# Patient Record
Sex: Female | Born: 1975 | Race: White | Hispanic: No | Marital: Single | Smoking: Former smoker
Health system: Southern US, Community
[De-identification: ages and names within clinical notes are randomized; demographics above are authoritative.]

## PROBLEM LIST (undated history)

## (undated) DIAGNOSIS — J449 Chronic obstructive pulmonary disease, unspecified: Secondary | ICD-10-CM

## (undated) DIAGNOSIS — G8929 Other chronic pain: Secondary | ICD-10-CM

## (undated) DIAGNOSIS — R109 Unspecified abdominal pain: Secondary | ICD-10-CM

## (undated) DIAGNOSIS — F419 Anxiety disorder, unspecified: Secondary | ICD-10-CM

## (undated) DIAGNOSIS — D332 Benign neoplasm of brain, unspecified: Secondary | ICD-10-CM

## (undated) DIAGNOSIS — F32A Depression, unspecified: Secondary | ICD-10-CM

## (undated) DIAGNOSIS — F329 Major depressive disorder, single episode, unspecified: Secondary | ICD-10-CM

## (undated) DIAGNOSIS — O24419 Gestational diabetes mellitus in pregnancy, unspecified control: Secondary | ICD-10-CM

## (undated) DIAGNOSIS — M549 Dorsalgia, unspecified: Secondary | ICD-10-CM

## (undated) HISTORY — DX: Anxiety disorder, unspecified: F41.9

## (undated) HISTORY — PX: BRAIN SURGERY: SHX531

## (undated) HISTORY — DX: Chronic obstructive pulmonary disease, unspecified: J44.9

## (undated) HISTORY — DX: Depression, unspecified: F32.A

## (undated) HISTORY — PX: CHOLECYSTECTOMY: SHX55

## (undated) HISTORY — PX: FINGER SURGERY: SHX640

## (undated) HISTORY — PX: APPENDECTOMY: SHX54

## (undated) HISTORY — PX: TONSILLECTOMY: SUR1361

## (undated) HISTORY — DX: Major depressive disorder, single episode, unspecified: F32.9

## (undated) HISTORY — PX: ABDOMINAL HYSTERECTOMY: SHX81

## (undated) HISTORY — DX: Gestational diabetes mellitus in pregnancy, unspecified control: O24.419

---

## 1998-01-31 HISTORY — PX: TUBAL LIGATION: SHX77

## 2001-06-05 ENCOUNTER — Emergency Department (HOSPITAL_COMMUNITY): Admission: EM | Admit: 2001-06-05 | Discharge: 2001-06-06 | Payer: Self-pay | Admitting: Internal Medicine

## 2001-06-06 ENCOUNTER — Encounter: Payer: Self-pay | Admitting: Internal Medicine

## 2001-06-10 ENCOUNTER — Encounter: Payer: Self-pay | Admitting: Emergency Medicine

## 2001-06-10 ENCOUNTER — Emergency Department (HOSPITAL_COMMUNITY): Admission: EM | Admit: 2001-06-10 | Discharge: 2001-06-10 | Payer: Self-pay | Admitting: Emergency Medicine

## 2002-03-02 ENCOUNTER — Emergency Department (HOSPITAL_COMMUNITY): Admission: EM | Admit: 2002-03-02 | Discharge: 2002-03-02 | Payer: Self-pay | Admitting: Emergency Medicine

## 2002-04-01 ENCOUNTER — Emergency Department (HOSPITAL_COMMUNITY): Admission: EM | Admit: 2002-04-01 | Discharge: 2002-04-01 | Payer: Self-pay | Admitting: Emergency Medicine

## 2002-04-01 ENCOUNTER — Encounter: Payer: Self-pay | Admitting: Emergency Medicine

## 2002-07-19 ENCOUNTER — Encounter: Payer: Self-pay | Admitting: Emergency Medicine

## 2002-07-19 ENCOUNTER — Emergency Department (HOSPITAL_COMMUNITY): Admission: EM | Admit: 2002-07-19 | Discharge: 2002-07-19 | Payer: Self-pay | Admitting: Emergency Medicine

## 2002-12-18 ENCOUNTER — Emergency Department (HOSPITAL_COMMUNITY): Admission: EM | Admit: 2002-12-18 | Discharge: 2002-12-18 | Payer: Self-pay | Admitting: Internal Medicine

## 2003-01-07 ENCOUNTER — Emergency Department (HOSPITAL_COMMUNITY): Admission: EM | Admit: 2003-01-07 | Discharge: 2003-01-07 | Payer: Self-pay | Admitting: *Deleted

## 2003-03-07 ENCOUNTER — Emergency Department (HOSPITAL_COMMUNITY): Admission: EM | Admit: 2003-03-07 | Discharge: 2003-03-07 | Payer: Self-pay | Admitting: Emergency Medicine

## 2003-03-10 ENCOUNTER — Emergency Department (HOSPITAL_COMMUNITY): Admission: EM | Admit: 2003-03-10 | Discharge: 2003-03-10 | Payer: Self-pay | Admitting: *Deleted

## 2003-03-17 ENCOUNTER — Emergency Department (HOSPITAL_COMMUNITY): Admission: EM | Admit: 2003-03-17 | Discharge: 2003-03-17 | Payer: Self-pay | Admitting: Emergency Medicine

## 2003-05-23 ENCOUNTER — Emergency Department (HOSPITAL_COMMUNITY): Admission: EM | Admit: 2003-05-23 | Discharge: 2003-05-23 | Payer: Self-pay

## 2003-08-25 ENCOUNTER — Emergency Department (HOSPITAL_COMMUNITY): Admission: EM | Admit: 2003-08-25 | Discharge: 2003-08-25 | Payer: Self-pay | Admitting: Emergency Medicine

## 2003-08-28 ENCOUNTER — Emergency Department (HOSPITAL_COMMUNITY): Admission: EM | Admit: 2003-08-28 | Discharge: 2003-08-28 | Payer: Self-pay | Admitting: Emergency Medicine

## 2004-04-19 ENCOUNTER — Emergency Department (HOSPITAL_COMMUNITY): Admission: EM | Admit: 2004-04-19 | Discharge: 2004-04-19 | Payer: Self-pay | Admitting: Emergency Medicine

## 2004-06-20 ENCOUNTER — Emergency Department (HOSPITAL_COMMUNITY): Admission: EM | Admit: 2004-06-20 | Discharge: 2004-06-20 | Payer: Self-pay

## 2004-08-01 ENCOUNTER — Emergency Department (HOSPITAL_COMMUNITY): Admission: EM | Admit: 2004-08-01 | Discharge: 2004-08-01 | Payer: Self-pay | Admitting: Emergency Medicine

## 2004-11-27 ENCOUNTER — Emergency Department (HOSPITAL_COMMUNITY): Admission: EM | Admit: 2004-11-27 | Discharge: 2004-11-28 | Payer: Self-pay | Admitting: Emergency Medicine

## 2005-03-28 ENCOUNTER — Emergency Department (HOSPITAL_COMMUNITY): Admission: EM | Admit: 2005-03-28 | Discharge: 2005-03-28 | Payer: Self-pay | Admitting: Emergency Medicine

## 2005-06-27 ENCOUNTER — Emergency Department (HOSPITAL_COMMUNITY): Admission: EM | Admit: 2005-06-27 | Discharge: 2005-06-27 | Payer: Self-pay | Admitting: Emergency Medicine

## 2005-06-28 ENCOUNTER — Emergency Department (HOSPITAL_COMMUNITY): Admission: EM | Admit: 2005-06-28 | Discharge: 2005-06-29 | Payer: Self-pay | Admitting: Emergency Medicine

## 2005-07-01 ENCOUNTER — Emergency Department (HOSPITAL_COMMUNITY): Admission: EM | Admit: 2005-07-01 | Discharge: 2005-07-02 | Payer: Self-pay | Admitting: Emergency Medicine

## 2005-08-06 ENCOUNTER — Emergency Department (HOSPITAL_COMMUNITY): Admission: EM | Admit: 2005-08-06 | Discharge: 2005-08-07 | Payer: Self-pay | Admitting: Emergency Medicine

## 2005-09-01 ENCOUNTER — Emergency Department (HOSPITAL_COMMUNITY): Admission: EM | Admit: 2005-09-01 | Discharge: 2005-09-01 | Payer: Self-pay | Admitting: Emergency Medicine

## 2005-09-24 ENCOUNTER — Emergency Department (HOSPITAL_COMMUNITY): Admission: EM | Admit: 2005-09-24 | Discharge: 2005-09-25 | Payer: Self-pay | Admitting: Emergency Medicine

## 2005-10-18 ENCOUNTER — Emergency Department (HOSPITAL_COMMUNITY): Admission: EM | Admit: 2005-10-18 | Discharge: 2005-10-19 | Payer: Self-pay | Admitting: Emergency Medicine

## 2005-11-11 ENCOUNTER — Emergency Department (HOSPITAL_COMMUNITY): Admission: EM | Admit: 2005-11-11 | Discharge: 2005-11-11 | Payer: Self-pay | Admitting: Emergency Medicine

## 2005-12-15 ENCOUNTER — Emergency Department (HOSPITAL_COMMUNITY): Admission: EM | Admit: 2005-12-15 | Discharge: 2005-12-15 | Payer: Self-pay | Admitting: Emergency Medicine

## 2006-02-12 ENCOUNTER — Emergency Department (HOSPITAL_COMMUNITY): Admission: EM | Admit: 2006-02-12 | Discharge: 2006-02-12 | Payer: Self-pay | Admitting: Emergency Medicine

## 2006-09-07 ENCOUNTER — Emergency Department (HOSPITAL_COMMUNITY): Admission: EM | Admit: 2006-09-07 | Discharge: 2006-09-07 | Payer: Self-pay | Admitting: Emergency Medicine

## 2006-10-03 ENCOUNTER — Emergency Department (HOSPITAL_COMMUNITY): Admission: EM | Admit: 2006-10-03 | Discharge: 2006-10-03 | Payer: Self-pay | Admitting: Emergency Medicine

## 2006-10-13 ENCOUNTER — Emergency Department (HOSPITAL_COMMUNITY): Admission: EM | Admit: 2006-10-13 | Discharge: 2006-10-13 | Payer: Self-pay | Admitting: Emergency Medicine

## 2006-10-25 ENCOUNTER — Emergency Department (HOSPITAL_COMMUNITY): Admission: EM | Admit: 2006-10-25 | Discharge: 2006-10-25 | Payer: Self-pay | Admitting: Emergency Medicine

## 2006-12-14 ENCOUNTER — Emergency Department (HOSPITAL_COMMUNITY): Admission: EM | Admit: 2006-12-14 | Discharge: 2006-12-14 | Payer: Self-pay | Admitting: Emergency Medicine

## 2007-02-01 ENCOUNTER — Emergency Department (HOSPITAL_COMMUNITY): Admission: EM | Admit: 2007-02-01 | Discharge: 2007-02-01 | Payer: Self-pay | Admitting: Emergency Medicine

## 2007-02-07 ENCOUNTER — Emergency Department (HOSPITAL_COMMUNITY): Admission: EM | Admit: 2007-02-07 | Discharge: 2007-02-07 | Payer: Self-pay | Admitting: Emergency Medicine

## 2007-06-12 ENCOUNTER — Emergency Department (HOSPITAL_COMMUNITY): Admission: EM | Admit: 2007-06-12 | Discharge: 2007-06-12 | Payer: Self-pay | Admitting: Emergency Medicine

## 2007-08-24 ENCOUNTER — Emergency Department (HOSPITAL_COMMUNITY): Admission: EM | Admit: 2007-08-24 | Discharge: 2007-08-24 | Payer: Self-pay | Admitting: Emergency Medicine

## 2007-09-06 ENCOUNTER — Emergency Department (HOSPITAL_COMMUNITY): Admission: EM | Admit: 2007-09-06 | Discharge: 2007-09-06 | Payer: Self-pay | Admitting: Emergency Medicine

## 2007-10-06 ENCOUNTER — Emergency Department (HOSPITAL_COMMUNITY): Admission: EM | Admit: 2007-10-06 | Discharge: 2007-10-06 | Payer: Self-pay | Admitting: Emergency Medicine

## 2007-10-09 ENCOUNTER — Emergency Department (HOSPITAL_COMMUNITY): Admission: EM | Admit: 2007-10-09 | Discharge: 2007-10-09 | Payer: Self-pay | Admitting: Emergency Medicine

## 2007-11-09 ENCOUNTER — Emergency Department (HOSPITAL_COMMUNITY): Admission: EM | Admit: 2007-11-09 | Discharge: 2007-11-09 | Payer: Self-pay | Admitting: Emergency Medicine

## 2007-11-22 ENCOUNTER — Emergency Department (HOSPITAL_COMMUNITY): Admission: EM | Admit: 2007-11-22 | Discharge: 2007-11-22 | Payer: Self-pay | Admitting: Emergency Medicine

## 2007-11-23 ENCOUNTER — Emergency Department (HOSPITAL_COMMUNITY): Admission: EM | Admit: 2007-11-23 | Discharge: 2007-11-23 | Payer: Self-pay | Admitting: Emergency Medicine

## 2007-11-29 ENCOUNTER — Emergency Department (HOSPITAL_COMMUNITY): Admission: EM | Admit: 2007-11-29 | Discharge: 2007-11-29 | Payer: Self-pay | Admitting: Emergency Medicine

## 2007-12-03 ENCOUNTER — Emergency Department (HOSPITAL_COMMUNITY): Admission: EM | Admit: 2007-12-03 | Discharge: 2007-12-03 | Payer: Self-pay | Admitting: Emergency Medicine

## 2007-12-13 ENCOUNTER — Emergency Department (HOSPITAL_COMMUNITY): Admission: EM | Admit: 2007-12-13 | Discharge: 2007-12-13 | Payer: Self-pay | Admitting: Emergency Medicine

## 2007-12-27 ENCOUNTER — Emergency Department (HOSPITAL_COMMUNITY): Admission: EM | Admit: 2007-12-27 | Discharge: 2007-12-27 | Payer: Self-pay | Admitting: Emergency Medicine

## 2007-12-30 ENCOUNTER — Emergency Department (HOSPITAL_COMMUNITY): Admission: EM | Admit: 2007-12-30 | Discharge: 2007-12-30 | Payer: Self-pay | Admitting: Emergency Medicine

## 2008-04-23 ENCOUNTER — Emergency Department (HOSPITAL_COMMUNITY): Admission: EM | Admit: 2008-04-23 | Discharge: 2008-04-23 | Payer: Self-pay | Admitting: Emergency Medicine

## 2008-04-25 ENCOUNTER — Emergency Department (HOSPITAL_COMMUNITY): Admission: EM | Admit: 2008-04-25 | Discharge: 2008-04-25 | Payer: Self-pay | Admitting: Emergency Medicine

## 2008-05-22 ENCOUNTER — Emergency Department (HOSPITAL_COMMUNITY): Admission: EM | Admit: 2008-05-22 | Discharge: 2008-05-22 | Payer: Self-pay | Admitting: Emergency Medicine

## 2008-07-01 ENCOUNTER — Emergency Department (HOSPITAL_COMMUNITY): Admission: EM | Admit: 2008-07-01 | Discharge: 2008-07-01 | Payer: Self-pay | Admitting: Emergency Medicine

## 2008-07-22 ENCOUNTER — Emergency Department (HOSPITAL_COMMUNITY): Admission: EM | Admit: 2008-07-22 | Discharge: 2008-07-22 | Payer: Self-pay | Admitting: Emergency Medicine

## 2008-11-24 ENCOUNTER — Emergency Department (HOSPITAL_COMMUNITY): Admission: EM | Admit: 2008-11-24 | Discharge: 2008-11-24 | Payer: Self-pay | Admitting: Emergency Medicine

## 2008-12-12 ENCOUNTER — Emergency Department (HOSPITAL_COMMUNITY): Admission: EM | Admit: 2008-12-12 | Discharge: 2008-12-12 | Payer: Self-pay | Admitting: Emergency Medicine

## 2008-12-23 ENCOUNTER — Emergency Department (HOSPITAL_COMMUNITY): Admission: EM | Admit: 2008-12-23 | Discharge: 2008-12-23 | Payer: Self-pay | Admitting: *Deleted

## 2009-02-01 ENCOUNTER — Emergency Department (HOSPITAL_COMMUNITY): Admission: EM | Admit: 2009-02-01 | Discharge: 2009-02-01 | Payer: Self-pay | Admitting: Emergency Medicine

## 2009-02-08 ENCOUNTER — Emergency Department (HOSPITAL_COMMUNITY): Admission: EM | Admit: 2009-02-08 | Discharge: 2009-02-08 | Payer: Self-pay | Admitting: Emergency Medicine

## 2009-03-06 ENCOUNTER — Emergency Department (HOSPITAL_COMMUNITY): Admission: EM | Admit: 2009-03-06 | Discharge: 2009-03-06 | Payer: Self-pay | Admitting: Obstetrics and Gynecology

## 2009-03-23 ENCOUNTER — Emergency Department (HOSPITAL_COMMUNITY): Admission: EM | Admit: 2009-03-23 | Discharge: 2009-03-23 | Payer: Self-pay | Admitting: Emergency Medicine

## 2009-04-16 ENCOUNTER — Emergency Department (HOSPITAL_COMMUNITY): Admission: EM | Admit: 2009-04-16 | Discharge: 2009-04-16 | Payer: Self-pay | Admitting: Emergency Medicine

## 2009-04-20 ENCOUNTER — Emergency Department (HOSPITAL_COMMUNITY): Admission: EM | Admit: 2009-04-20 | Discharge: 2009-04-20 | Payer: Self-pay | Admitting: Emergency Medicine

## 2009-05-29 ENCOUNTER — Emergency Department (HOSPITAL_COMMUNITY): Admission: EM | Admit: 2009-05-29 | Discharge: 2009-05-29 | Payer: Self-pay | Admitting: Emergency Medicine

## 2009-06-02 ENCOUNTER — Emergency Department (HOSPITAL_COMMUNITY): Admission: EM | Admit: 2009-06-02 | Discharge: 2009-06-02 | Payer: Self-pay | Admitting: Emergency Medicine

## 2009-07-22 ENCOUNTER — Emergency Department (HOSPITAL_COMMUNITY): Admission: EM | Admit: 2009-07-22 | Discharge: 2009-07-22 | Payer: Self-pay | Admitting: Emergency Medicine

## 2009-08-03 ENCOUNTER — Emergency Department (HOSPITAL_COMMUNITY): Admission: EM | Admit: 2009-08-03 | Discharge: 2009-08-03 | Payer: Self-pay | Admitting: Emergency Medicine

## 2009-10-02 ENCOUNTER — Emergency Department (HOSPITAL_COMMUNITY): Admission: EM | Admit: 2009-10-02 | Discharge: 2009-10-02 | Payer: Self-pay | Admitting: Emergency Medicine

## 2009-11-25 ENCOUNTER — Emergency Department (HOSPITAL_COMMUNITY): Admission: EM | Admit: 2009-11-25 | Discharge: 2009-11-25 | Payer: Self-pay | Admitting: Emergency Medicine

## 2010-01-20 ENCOUNTER — Emergency Department (HOSPITAL_COMMUNITY): Admission: EM | Admit: 2010-01-20 | Discharge: 2010-01-20 | Payer: Self-pay | Admitting: Emergency Medicine

## 2010-02-04 ENCOUNTER — Encounter (HOSPITAL_COMMUNITY): Admission: RE | Admit: 2010-02-04 | Discharge: 2010-02-04 | Payer: Self-pay | Admitting: Orthopaedic Surgery

## 2010-03-23 ENCOUNTER — Emergency Department (HOSPITAL_COMMUNITY): Admission: EM | Admit: 2010-03-23 | Discharge: 2010-03-23 | Payer: Self-pay | Admitting: Emergency Medicine

## 2010-04-23 ENCOUNTER — Emergency Department (HOSPITAL_COMMUNITY): Admission: EM | Admit: 2010-04-23 | Discharge: 2010-04-24 | Payer: Self-pay | Admitting: Emergency Medicine

## 2010-04-25 ENCOUNTER — Emergency Department (HOSPITAL_COMMUNITY): Admission: EM | Admit: 2010-04-25 | Discharge: 2010-04-25 | Payer: Self-pay | Admitting: Emergency Medicine

## 2010-06-09 ENCOUNTER — Emergency Department (HOSPITAL_COMMUNITY): Admission: EM | Admit: 2010-06-09 | Discharge: 2010-06-09 | Payer: Self-pay | Admitting: Emergency Medicine

## 2010-07-27 ENCOUNTER — Emergency Department (HOSPITAL_COMMUNITY): Admission: EM | Admit: 2010-07-27 | Discharge: 2010-07-27 | Payer: Self-pay | Admitting: Emergency Medicine

## 2010-08-30 ENCOUNTER — Encounter
Admission: RE | Admit: 2010-08-30 | Discharge: 2010-09-30 | Payer: Self-pay | Source: Home / Self Care | Attending: Orthopaedic Surgery | Admitting: Orthopaedic Surgery

## 2010-09-06 ENCOUNTER — Emergency Department (HOSPITAL_COMMUNITY)
Admission: EM | Admit: 2010-09-06 | Discharge: 2010-09-06 | Payer: Self-pay | Source: Home / Self Care | Admitting: Emergency Medicine

## 2010-09-28 ENCOUNTER — Ambulatory Visit (HOSPITAL_COMMUNITY)
Admission: RE | Admit: 2010-09-28 | Discharge: 2010-09-28 | Payer: Self-pay | Source: Home / Self Care | Attending: Orthopaedic Surgery | Admitting: Orthopaedic Surgery

## 2010-10-14 ENCOUNTER — Emergency Department (HOSPITAL_COMMUNITY)
Admission: EM | Admit: 2010-10-14 | Discharge: 2010-10-14 | Payer: Self-pay | Source: Home / Self Care | Admitting: Emergency Medicine

## 2010-11-19 ENCOUNTER — Emergency Department (HOSPITAL_COMMUNITY)
Admission: EM | Admit: 2010-11-19 | Discharge: 2010-11-19 | Disposition: A | Discharge status: Home / Self Care | Payer: Medicaid Other | Attending: Emergency Medicine | Admitting: Emergency Medicine

## 2010-11-19 DIAGNOSIS — R112 Nausea with vomiting, unspecified: Secondary | ICD-10-CM | POA: Insufficient documentation

## 2010-11-19 DIAGNOSIS — R197 Diarrhea, unspecified: Secondary | ICD-10-CM | POA: Insufficient documentation

## 2010-11-19 DIAGNOSIS — N390 Urinary tract infection, site not specified: Secondary | ICD-10-CM | POA: Insufficient documentation

## 2010-11-19 LAB — CBC
HCT: 38.7 % (ref 36.0–46.0)
MCH: 31.4 pg (ref 26.0–34.0)
MCHC: 34.1 g/dL (ref 30.0–36.0)
MCV: 92.1 fL (ref 78.0–100.0)
Platelets: 286 10*3/uL (ref 150–400)
RDW: 13.1 % (ref 11.5–15.5)
WBC: 9 10*3/uL (ref 4.0–10.5)

## 2010-11-19 LAB — COMPREHENSIVE METABOLIC PANEL
ALT: 14 U/L (ref 0–35)
Alkaline Phosphatase: 44 U/L (ref 39–117)
CO2: 23 mEq/L (ref 19–32)
GFR calc non Af Amer: >60 mL/min (ref >60)
Glucose, Bld: 89 mg/dL (ref 70–99)
Potassium: 4 mEq/L (ref 3.5–5.1)
Sodium: 138 mEq/L (ref 135–145)

## 2010-11-19 LAB — URINALYSIS, ROUTINE W REFLEX MICROSCOPIC
Bilirubin Urine: NEGATIVE
Ketones, ur: NEGATIVE mg/dL
Nitrite: NEGATIVE
Protein, ur: NEGATIVE mg/dL
Urobilinogen, UA: 0.2 mg/dL (ref 0.0–1.0)

## 2010-11-19 LAB — LIPASE, BLOOD: Lipase: 21 U/L (ref 11–59)

## 2010-11-27 ENCOUNTER — Emergency Department (HOSPITAL_COMMUNITY)
Admission: EM | Admit: 2010-11-27 | Discharge: 2010-11-27 | Disposition: A | Discharge status: Home / Self Care | Payer: Medicaid Other | Attending: Emergency Medicine | Admitting: Emergency Medicine

## 2010-11-27 DIAGNOSIS — IMO0001 Reserved for inherently not codable concepts without codable children: Secondary | ICD-10-CM | POA: Insufficient documentation

## 2010-11-27 DIAGNOSIS — A599 Trichomoniasis, unspecified: Secondary | ICD-10-CM | POA: Insufficient documentation

## 2010-11-27 DIAGNOSIS — R109 Unspecified abdominal pain: Secondary | ICD-10-CM | POA: Insufficient documentation

## 2010-11-27 DIAGNOSIS — R112 Nausea with vomiting, unspecified: Secondary | ICD-10-CM | POA: Insufficient documentation

## 2010-11-27 DIAGNOSIS — R51 Headache: Secondary | ICD-10-CM | POA: Insufficient documentation

## 2010-11-27 LAB — COMPREHENSIVE METABOLIC PANEL
ALT: 13 U/L (ref 0–35)
BUN: 8 mg/dL (ref 6–23)
Calcium: 9.4 mg/dL (ref 8.4–10.5)
Creatinine, Ser: 0.73 mg/dL (ref 0.4–1.2)
Glucose, Bld: 89 mg/dL (ref 70–99)
Sodium: 138 mEq/L (ref 135–145)
Total Protein: 7.2 g/dL (ref 6.0–8.3)

## 2010-11-27 LAB — URINALYSIS, ROUTINE W REFLEX MICROSCOPIC
Protein, ur: NEGATIVE mg/dL
Specific Gravity, Urine: 1.02 (ref 1.005–1.030)
Urine Glucose, Fasting: NEGATIVE mg/dL
pH: 6 (ref 5.0–8.0)

## 2010-11-27 LAB — DIFFERENTIAL
Basophils Absolute: 0.1 10*3/uL (ref 0.0–0.1)
Eosinophils Relative: 3 % (ref 0–5)
Lymphocytes Relative: 30 % (ref 12–46)
Monocytes Absolute: 0.5 10*3/uL (ref 0.1–1.0)

## 2010-11-27 LAB — POCT PREGNANCY, URINE: Preg Test, Ur: NEGATIVE

## 2010-11-27 LAB — URINE MICROSCOPIC-ADD ON

## 2010-11-27 LAB — LIPASE, BLOOD: Lipase: 21 U/L (ref 11–59)

## 2010-11-27 LAB — CBC
HCT: 38.7 % (ref 36.0–46.0)
MCHC: 34.1 g/dL (ref 30.0–36.0)
RDW: 13.8 % (ref 11.5–15.5)

## 2010-12-05 ENCOUNTER — Emergency Department (HOSPITAL_COMMUNITY)
Admission: EM | Admit: 2010-12-05 | Discharge: 2010-12-05 | Disposition: A | Discharge status: Home / Self Care | Payer: Medicaid Other | Attending: Emergency Medicine | Admitting: Emergency Medicine

## 2010-12-05 DIAGNOSIS — R109 Unspecified abdominal pain: Secondary | ICD-10-CM | POA: Insufficient documentation

## 2010-12-05 DIAGNOSIS — R112 Nausea with vomiting, unspecified: Secondary | ICD-10-CM | POA: Insufficient documentation

## 2010-12-05 DIAGNOSIS — R197 Diarrhea, unspecified: Secondary | ICD-10-CM | POA: Insufficient documentation

## 2010-12-05 LAB — POCT I-STAT, CHEM 8
Chloride: 109 mEq/L (ref 96–112)
HCT: 41 % (ref 36.0–46.0)
Potassium: 4 mEq/L (ref 3.5–5.1)
Sodium: 142 mEq/L (ref 135–145)

## 2010-12-06 LAB — OVA AND PARASITE EXAMINATION: Ova and parasites: NONE SEEN

## 2010-12-09 LAB — STOOL CULTURE

## 2010-12-15 LAB — BASIC METABOLIC PANEL
CO2: 27 mEq/L (ref 19–32)
GFR calc Af Amer: >60 mL/min (ref >60)
GFR calc non Af Amer: >60 mL/min (ref >60)
Glucose, Bld: 93 mg/dL (ref 70–99)
Potassium: 3.6 mEq/L (ref 3.5–5.1)
Sodium: 133 mEq/L — ABNORMAL LOW (ref 135–145)

## 2010-12-15 LAB — TROPONIN I: Troponin I: 0.01 ng/mL (ref 0.00–0.06)

## 2010-12-15 LAB — CK TOTAL AND CKMB (NOT AT ARMC)
CK, MB: 1.2 ng/mL (ref 0.3–4.0)
Relative Index: 1 (ref 0.0–2.5)

## 2010-12-29 ENCOUNTER — Emergency Department (HOSPITAL_COMMUNITY): Payer: Medicaid Other

## 2010-12-29 ENCOUNTER — Emergency Department (HOSPITAL_COMMUNITY)
Admission: EM | Admit: 2010-12-29 | Discharge: 2010-12-29 | Disposition: A | Discharge status: Home / Self Care | Payer: Medicaid Other | Attending: Emergency Medicine | Admitting: Emergency Medicine

## 2010-12-29 DIAGNOSIS — S6720XA Crushing injury of unspecified hand, initial encounter: Secondary | ICD-10-CM | POA: Insufficient documentation

## 2010-12-29 DIAGNOSIS — W208XXA Other cause of strike by thrown, projected or falling object, initial encounter: Secondary | ICD-10-CM | POA: Insufficient documentation

## 2011-01-09 LAB — RAPID URINE DRUG SCREEN, HOSP PERFORMED
Barbiturates: NOT DETECTED
Benzodiazepines: POSITIVE — AB
Cocaine: NOT DETECTED
Opiates: NOT DETECTED

## 2011-01-09 LAB — URINALYSIS, ROUTINE W REFLEX MICROSCOPIC
Bilirubin Urine: NEGATIVE
Bilirubin Urine: NEGATIVE
Glucose, UA: NEGATIVE mg/dL
Glucose, UA: NEGATIVE mg/dL
Hgb urine dipstick: NEGATIVE
Hgb urine dipstick: NEGATIVE
Ketones, ur: NEGATIVE mg/dL
Nitrite: NEGATIVE
Protein, ur: NEGATIVE mg/dL
Specific Gravity, Urine: <1.005 — ABNORMAL LOW (ref 1.005–1.030)
pH: 5 (ref 5.0–8.0)
pH: 7 (ref 5.0–8.0)

## 2011-01-09 LAB — URINE CULTURE
Colony Count: NO GROWTH
Culture: NO GROWTH

## 2011-01-09 LAB — DIFFERENTIAL
Basophils Absolute: 0 10*3/uL (ref 0.0–0.1)
Basophils Relative: 0 % (ref 0–1)
Lymphocytes Relative: 21 % (ref 12–46)
Monocytes Absolute: 0.5 10*3/uL (ref 0.1–1.0)
Neutro Abs: 10 10*3/uL — ABNORMAL HIGH (ref 1.7–7.7)
Neutrophils Relative %: 74 % (ref 43–77)

## 2011-01-09 LAB — URINE MICROSCOPIC-ADD ON

## 2011-01-09 LAB — PREGNANCY, URINE: Preg Test, Ur: NEGATIVE

## 2011-01-09 LAB — BASIC METABOLIC PANEL
Calcium: 9.9 mg/dL (ref 8.4–10.5)
Creatinine, Ser: 0.84 mg/dL (ref 0.4–1.2)
GFR calc Af Amer: >60 mL/min (ref >60)
GFR calc non Af Amer: >60 mL/min (ref >60)
Glucose, Bld: 62 mg/dL — ABNORMAL LOW (ref 70–99)
Sodium: 140 mEq/L (ref 135–145)

## 2011-01-09 LAB — CBC
Hemoglobin: 15.3 g/dL — ABNORMAL HIGH (ref 12.0–15.0)
MCHC: 35.1 g/dL (ref 30.0–36.0)
RDW: 13.7 % (ref 11.5–15.5)

## 2011-01-10 LAB — URINALYSIS, ROUTINE W REFLEX MICROSCOPIC
Bilirubin Urine: NEGATIVE
Hgb urine dipstick: NEGATIVE
Ketones, ur: NEGATIVE mg/dL
Nitrite: NEGATIVE
Protein, ur: NEGATIVE mg/dL
Specific Gravity, Urine: 1.006 (ref 1.005–1.030)
Urobilinogen, UA: 0.2 mg/dL (ref 0.0–1.0)

## 2011-01-10 LAB — POCT I-STAT, CHEM 8
Chloride: 106 mEq/L (ref 96–112)
Creatinine, Ser: 0.9 mg/dL (ref 0.4–1.2)
Hemoglobin: 15 g/dL (ref 12.0–15.0)
Potassium: 4 mEq/L (ref 3.5–5.1)
Sodium: 138 mEq/L (ref 135–145)

## 2011-01-10 LAB — URINE MICROSCOPIC-ADD ON

## 2011-01-22 ENCOUNTER — Emergency Department (HOSPITAL_COMMUNITY): Payer: Medicaid Other

## 2011-01-22 ENCOUNTER — Emergency Department (HOSPITAL_COMMUNITY)
Admission: EM | Admit: 2011-01-22 | Discharge: 2011-01-22 | Disposition: A | Discharge status: Home / Self Care | Payer: Medicaid Other | Attending: Emergency Medicine | Admitting: Emergency Medicine

## 2011-01-22 DIAGNOSIS — S9030XA Contusion of unspecified foot, initial encounter: Secondary | ICD-10-CM | POA: Insufficient documentation

## 2011-01-22 DIAGNOSIS — Z8543 Personal history of malignant neoplasm of ovary: Secondary | ICD-10-CM | POA: Insufficient documentation

## 2011-01-22 DIAGNOSIS — M129 Arthropathy, unspecified: Secondary | ICD-10-CM | POA: Insufficient documentation

## 2011-01-22 DIAGNOSIS — J45909 Unspecified asthma, uncomplicated: Secondary | ICD-10-CM | POA: Insufficient documentation

## 2011-01-22 DIAGNOSIS — G43909 Migraine, unspecified, not intractable, without status migrainosus: Secondary | ICD-10-CM | POA: Insufficient documentation

## 2011-02-02 ENCOUNTER — Emergency Department (HOSPITAL_COMMUNITY)
Admission: EM | Admit: 2011-02-02 | Discharge: 2011-02-02 | Disposition: A | Discharge status: Home / Self Care | Payer: Medicaid Other | Attending: Emergency Medicine | Admitting: Emergency Medicine

## 2011-02-02 ENCOUNTER — Emergency Department (HOSPITAL_COMMUNITY): Payer: Medicaid Other

## 2011-02-02 DIAGNOSIS — Y998 Other external cause status: Secondary | ICD-10-CM | POA: Insufficient documentation

## 2011-02-02 DIAGNOSIS — G43909 Migraine, unspecified, not intractable, without status migrainosus: Secondary | ICD-10-CM | POA: Insufficient documentation

## 2011-02-02 DIAGNOSIS — J45909 Unspecified asthma, uncomplicated: Secondary | ICD-10-CM | POA: Insufficient documentation

## 2011-02-02 DIAGNOSIS — M129 Arthropathy, unspecified: Secondary | ICD-10-CM | POA: Insufficient documentation

## 2011-02-02 DIAGNOSIS — S9030XA Contusion of unspecified foot, initial encounter: Secondary | ICD-10-CM | POA: Insufficient documentation

## 2011-03-10 ENCOUNTER — Emergency Department (HOSPITAL_COMMUNITY)
Admission: EM | Admit: 2011-03-10 | Discharge: 2011-03-10 | Disposition: A | Discharge status: Home / Self Care | Payer: No Typology Code available for payment source | Attending: Emergency Medicine | Admitting: Emergency Medicine

## 2011-03-10 DIAGNOSIS — S335XXA Sprain of ligaments of lumbar spine, initial encounter: Secondary | ICD-10-CM | POA: Insufficient documentation

## 2011-03-10 DIAGNOSIS — M545 Low back pain, unspecified: Secondary | ICD-10-CM | POA: Insufficient documentation

## 2011-05-15 ENCOUNTER — Emergency Department (HOSPITAL_COMMUNITY)
Admission: EM | Admit: 2011-05-15 | Discharge: 2011-05-15 | Disposition: A | Discharge status: Home / Self Care | Payer: Medicaid Other | Attending: Emergency Medicine | Admitting: Emergency Medicine

## 2011-05-15 ENCOUNTER — Emergency Department (HOSPITAL_COMMUNITY): Payer: Medicaid Other

## 2011-05-15 DIAGNOSIS — X500XXA Overexertion from strenuous movement or load, initial encounter: Secondary | ICD-10-CM | POA: Insufficient documentation

## 2011-05-15 DIAGNOSIS — Y9269 Other specified industrial and construction area as the place of occurrence of the external cause: Secondary | ICD-10-CM | POA: Insufficient documentation

## 2011-05-15 DIAGNOSIS — M549 Dorsalgia, unspecified: Secondary | ICD-10-CM | POA: Insufficient documentation

## 2011-05-15 DIAGNOSIS — J45909 Unspecified asthma, uncomplicated: Secondary | ICD-10-CM | POA: Insufficient documentation

## 2011-05-15 LAB — URINALYSIS, ROUTINE W REFLEX MICROSCOPIC
Nitrite: NEGATIVE
Specific Gravity, Urine: 1.02 (ref 1.005–1.030)
Urobilinogen, UA: 0.2 mg/dL (ref 0.0–1.0)
pH: 6.5 (ref 5.0–8.0)

## 2011-05-15 LAB — URINE MICROSCOPIC-ADD ON

## 2011-05-15 LAB — PREGNANCY, URINE: Preg Test, Ur: NEGATIVE

## 2011-05-15 LAB — BASIC METABOLIC PANEL
CO2: 23 mEq/L (ref 19–32)
Calcium: 9.3 mg/dL (ref 8.4–10.5)
Creatinine, Ser: 0.6 mg/dL (ref 0.50–1.10)
GFR calc non Af Amer: >60 mL/min (ref >60)
Glucose, Bld: 86 mg/dL (ref 70–99)

## 2011-05-15 MED ORDER — HYDROCODONE-ACETAMINOPHEN 5-325 MG PO TABS: 2.0000 | ORAL_TABLET | ORAL | Status: AC | PRN

## 2011-05-15 MED ORDER — IBUPROFEN 800 MG PO TABS: 800.0000 mg | ORAL_TABLET | Freq: Three times a day (TID) | ORAL | Status: AC

## 2011-05-15 MED ORDER — SODIUM CHLORIDE 0.9 % IV BOLUS (SEPSIS)
1000.0000 mL | Freq: Once | INTRAVENOUS | Status: AC
  Administered 2011-05-15: 1000 mL via INTRAVENOUS

## 2011-05-15 MED ORDER — HYDROCODONE-ACETAMINOPHEN 5-325 MG PO TABS
2.0000 | ORAL_TABLET | Freq: Once | ORAL | Status: AC
  Administered 2011-05-15: 2 via ORAL
  Filled 2011-05-15: qty 2

## 2011-05-15 MED ORDER — ONDANSETRON HCL 4 MG/2ML IJ SOLN
4.0000 mg | Freq: Once | INTRAMUSCULAR | Status: AC
  Administered 2011-05-15: 4 mg via INTRAVENOUS
  Filled 2011-05-15: qty 2

## 2011-05-15 MED ORDER — ONDANSETRON 4 MG PO TBDP
4.0000 mg | ORAL_TABLET | Freq: Once | ORAL | Status: AC
  Administered 2011-05-15: 4 mg via ORAL
  Filled 2011-05-15: qty 1

## 2011-05-15 MED ORDER — HYDROMORPHONE HCL 1 MG/ML IJ SOLN
1.0000 mg | Freq: Once | INTRAMUSCULAR | Status: AC
  Administered 2011-05-15: 1 mg via INTRAVENOUS
  Filled 2011-05-15: qty 1

## 2011-05-15 NOTE — ED Notes (Signed)
Pt c/o nausea Md notified.

## 2011-05-15 NOTE — ED Provider Notes (Signed)
Scribed for Bonnie Octave, MD, the patient was seen in room 17. This chart was scribed by Jannette Fogo. This patient's care was started at 11:30.    CSN: [id number] Arrival date & time: 05/15/2011 11:06 AM  Chief Complaint  Patient presents with  . Back Pain   HPI Bonnie Jensen is a 35 y.o. female with a history of back arthritis who presents to the Emergency Department complaining of 1 week of constant right sided back pain. Patient developed back pain after twisting at work but denies any known injury or falls. States the pain radiates from right flank down to hip and to the back of the knee. Pain is exacerbated by movement and she took Ibuprofen and Tylenol but reports no relief. Patient also reports mild nausea but denies any numbness, weakness, fever, vomiting, abdominal pain, dysuria, or hematuria. Currently does not have a PCP. There are no other associated symptoms and no other alleviating or aggravating factors.    HPI ELEMENTS:  Location: Right back   Onset: Monday 05/09/11 Duration: 6 days  Timing: constant   Severity: "9"10  Modifying factors: exacerbated by movement  Context:  as above    Past Medical History  Diagnosis Date  . Asthma     Past Surgical History  Procedure Date  . Appendectomy   . Cholecystectomy   . Tonsillectomy   . Brain surgery   . Tubal ligation    MEDICATIONS:  Previous Medications   ACETAMINOPHEN (TYLENOL) 325 MG TABLET    Take 650 mg by mouth every 6 (six) hours as needed. For pain    IBUPROFEN (ADVIL) 200 MG TABLET    Take 400 mg by mouth every 6 (six) hours as needed. For pain      ALLERGIES:  Allergies as of 05/15/2011 - Review Complete 05/15/2011  Allergen Reaction Noted  . Imitrex (sumatriptan base) Other (See Comments) 05/15/2011  . Midrin Hives 05/15/2011  . Toradol Other (See Comments) 05/15/2011     FAMILY HISTORY:  No Pertinent Family History   History  Substance Use Topics  . Smoking status: Current Everyday  Smoker  . Smokeless tobacco: Not on file  . Alcohol Use: No    Review of Systems  Constitutional: Negative for fever.  Gastrointestinal: Negative for nausea, vomiting, abdominal pain and diarrhea.  Genitourinary: Positive for flank pain. Negative for dysuria and hematuria.  Musculoskeletal: Positive for back pain.  Neurological: Negative for weakness and numbness.  All other systems reviewed and are negative.    Physical Exam  BP 121/74  Pulse 78  Temp 99.3 F (37.4 C)  Resp 15  Ht 4\' 11"  (1.499 m)  Wt 140 lb (63.504 kg)  BMI 28.28 kg/m2  SpO2 100%  LMP 05/02/2011  Physical Exam  Constitutional: She is oriented to person, place, and time. She appears well-developed and well-nourished. No distress.  HENT:  Head: Normocephalic and atraumatic.  Mouth/Throat: Oropharynx is clear and moist.  Eyes: Conjunctivae and EOM are normal. Pupils are equal, round, and reactive to light.  Neck: Normal range of motion. Neck supple.  Cardiovascular: Normal rate, regular rhythm, normal heart sounds and intact distal pulses.   No murmur heard. Pulmonary/Chest: Effort normal and breath sounds normal.  Abdominal: Soft. Bowel sounds are normal. She exhibits no distension. There is no tenderness. There is CVA tenderness (right ).  Musculoskeletal: Normal range of motion. She exhibits no edema and no tenderness.       Cervical back: Normal. She exhibits normal range of  motion and no tenderness.       Thoracic back: She exhibits tenderness (low right thoracic/upper lumbar pain ).       Lumbar back: She exhibits tenderness (low right thoracic/upper lumbar pain ). She exhibits normal range of motion.       +Straight leg on the right, negative on the left.   Neurological: She is alert and oriented to person, place, and time. She has normal strength. No sensory deficit. She exhibits normal muscle tone. Coordination normal.  Skin: Skin is warm and dry. No rash noted.  Psychiatric: She has a normal mood  and affect.   Procedures  OTHER DATA REVIEWED: Nursing notes, vital signs, and past medical records reviewed.  DIAGNOSTIC STUDIES: Oxygen Saturation is 100% on room air, normal by my interpretation.     LABS:  Results for orders placed during the hospital encounter of 05/15/11  URINALYSIS, ROUTINE W REFLEX MICROSCOPIC      Component Value Range   Color, Urine YELLOW  YELLOW    Appearance CLEAR  CLEAR    Specific Gravity, Urine 1.020  1.005 - 1.030    pH 6.5  5.0 - 8.0    Glucose, UA NEGATIVE  NEGATIVE (mg/dL)   Hgb urine dipstick TRACE (*) NEGATIVE    Bilirubin Urine NEGATIVE  NEGATIVE    Ketones, ur NEGATIVE  NEGATIVE (mg/dL)   Protein, ur NEGATIVE  NEGATIVE (mg/dL)   Urobilinogen, UA 0.2  0.0 - 1.0 (mg/dL)   Nitrite NEGATIVE  NEGATIVE    Leukocytes, UA NEGATIVE  NEGATIVE   PREGNANCY, URINE      Component Value Range   Preg Test, Ur NEGATIVE    URINE MICROSCOPIC-ADD ON      Component Value Range   Squamous Epithelial / LPF FEW (*) RARE    WBC, UA 3-6  <3 (WBC/hpf)   RBC / HPF 7-10  <3 (RBC/hpf)   Bacteria, UA RARE  RARE   BASIC METABOLIC PANEL      Component Value Range   Sodium 138  135 - 145 (mEq/L)   Potassium 4.0  3.5 - 5.1 (mEq/L)   Chloride 104  96 - 112 (mEq/L)   CO2 23  19 - 32 (mEq/L)   Glucose, Bld 86  70 - 99 (mg/dL)   BUN 14  6 - 23 (mg/dL)   Creatinine, Ser 9.14  0.50 - 1.10 (mg/dL)   Calcium 9.3  8.4 - 78.2 (mg/dL)   GFR calc non Af Amer >60  >60 (mL/min)   GFR calc Af Amer >60  >60 (mL/min)   CT Abdomen / Pelvis: Interpreted by Radiologist Dr.SRIYESH Rito Ehrlich No renal, ureteral, or bladder calculi. No hydronephrosis. No evidence of bowel obstruction. Status post cholecystectomy. Status post appendectomy.   ED COURSE / COORDINATION OF CARE: 12:10 - Patient complains of nausea, Zofran ordered.  12:30 - Urinalysis returned, hematuria present, CT abdomen/pelvis ordered.  14:40 - CT returned and shows no kidney stones  15:00 - Re-examined, ED  physician discussed results with the patient. Patient stable for discharge    MDM: R lower thoracic/upper lumbar pain x 1 week.  Initially denied trauma but now says twisted back at work.  No neuro deficits.  No incontinence, fever, vomiting, weakness, numbness, tingling.   Likely MSK back pain but will r/o UTI.    IMPRESSION: Diagnoses that have been ruled out:  Diagnoses that are still under consideration:  Final diagnoses:    PLAN:  Home  The patient is to return the  emergency department if there is any worsening of symptoms. I have reviewed the discharge instructions with the patient.    CONDITION ON DISCHARGE: Stable   MEDICATIONS GIVEN IN THE E.D.  Medications  acetaminophen (TYLENOL) 325 MG tablet (not administered)  ibuprofen (ADVIL) 200 MG tablet (not administered)  HYDROcodone-acetaminophen (NORCO) 5-325 MG per tablet 2 tablet (2 tablet Oral Given 05/15/11 1144)  ondansetron (ZOFRAN-ODT) disintegrating tablet 4 mg (4 mg Oral Given 05/15/11 1220)  sodium chloride 0.9 % bolus 1,000 mL (1000 mL Intravenous Given 05/15/11 1248)  HYDROmorphone (DILAUDID) injection 1 mg (1 mg Intravenous Given 05/15/11 1252)  ondansetron (ZOFRAN) injection 4 mg (4 mg Intravenous Given 05/15/11 1252)     DISCHARGE MEDICATIONS: New Prescriptions   No medications on file    I personally performed the services described in this documentation, which was scribed in my presence.  The recorded information has been reviewed and considered.       Bonnie Octave, MD 05/15/11 1520

## 2011-05-15 NOTE — ED Notes (Signed)
Pt reports rt sided back pain for a week.  Pt states that the pain is so severe that its making her "sick to her stomach".  Pt has fever 99.3 in triage.  Pt denies any GU symptoms.

## 2011-05-15 NOTE — ED Notes (Signed)
Pt states twisted while bending over at work Monday 05/09/2011 and "hurt her back. Has taken motrin,and tylenol all week however is not helping. States has increasingly gotten worse over the week. Pt verbalized a previous diagnosis arthritis in lumbar region of back.

## 2011-05-15 NOTE — ED Notes (Signed)
Pt up to bathroom without difficulty States pain is better.

## 2011-05-15 NOTE — ED Notes (Addendum)
Pt states is still in pain. Oral medication "took the edge of the pain off".  MD aware.

## 2011-05-29 ENCOUNTER — Encounter (HOSPITAL_COMMUNITY): Payer: Self-pay

## 2011-05-29 ENCOUNTER — Emergency Department (HOSPITAL_COMMUNITY)
Admission: EM | Admit: 2011-05-29 | Discharge: 2011-05-29 | Disposition: A | Discharge status: Home / Self Care | Payer: Medicaid Other | Attending: Emergency Medicine | Admitting: Emergency Medicine

## 2011-05-29 DIAGNOSIS — M79609 Pain in unspecified limb: Secondary | ICD-10-CM | POA: Insufficient documentation

## 2011-05-29 DIAGNOSIS — S335XXA Sprain of ligaments of lumbar spine, initial encounter: Secondary | ICD-10-CM

## 2011-05-29 DIAGNOSIS — F17200 Nicotine dependence, unspecified, uncomplicated: Secondary | ICD-10-CM | POA: Insufficient documentation

## 2011-05-29 DIAGNOSIS — M545 Low back pain, unspecified: Secondary | ICD-10-CM | POA: Insufficient documentation

## 2011-05-29 DIAGNOSIS — J45909 Unspecified asthma, uncomplicated: Secondary | ICD-10-CM | POA: Insufficient documentation

## 2011-05-29 DIAGNOSIS — R110 Nausea: Secondary | ICD-10-CM | POA: Insufficient documentation

## 2011-05-29 MED ORDER — IBUPROFEN 600 MG PO TABS: 600.0000 mg | ORAL_TABLET | Freq: Four times a day (QID) | ORAL | Status: AC | PRN

## 2011-05-29 MED ORDER — HYDROCODONE-ACETAMINOPHEN 5-325 MG PO TABS: 1.0000 | ORAL_TABLET | ORAL | Status: AC | PRN

## 2011-05-29 NOTE — ED Notes (Signed)
Pt a/ox4. Resp even and unlabored. NAD at this time. D/C instructions reviewed with pt. Pt verbalized understanding. Pt ambulated with steady gate to POV. 

## 2011-05-29 NOTE — ED Notes (Signed)
Pt report pain to her lower back that shoots down her right leg for the past 3 days.  Pt denies any injury.  Pt reports "it hurts so bad, its making me nauseas".

## 2011-05-29 NOTE — ED Provider Notes (Signed)
History     CSN: [id number] Arrival date & time: 05/29/2011  4:45 PM  Chief Complaint  Patient presents with  . Back Pain  . Leg Pain  . Nausea   Patient is a 35 y.o. female presenting with back pain. The history is provided by the patient.  Back Pain  This is a recurrent problem. The current episode started more than 2 days ago. The problem occurs constantly. The problem has not changed since onset.The pain is associated with no known injury. The pain is present in the lumbar spine. The quality of the pain is described as aching and burning. The pain radiates to the right thigh. The pain is at a severity of 8/10. The pain is moderate. The symptoms are aggravated by twisting, bending and certain positions. The pain is worse during the day. Pertinent negatives include no chest pain, no fever, no numbness, no headaches, no abdominal pain, no abdominal swelling, no dysuria, no leg pain, no paresis, no tingling and no weakness. She has tried NSAIDs, heat and bed rest for the symptoms. The treatment provided no relief.    Past Medical History  Diagnosis Date  . Asthma     Past Surgical History  Procedure Date  . Appendectomy   . Cholecystectomy   . Tonsillectomy   . Brain surgery   . Tubal ligation     No family history on file.  History  Substance Use Topics  . Smoking status: Current Everyday Smoker  . Smokeless tobacco: Not on file  . Alcohol Use: No    OB History    Grav Para Term Preterm Abortions TAB SAB Ect Mult Living                  Review of Systems  Constitutional: Negative for fever.  HENT: Negative for congestion, sore throat and neck pain.   Eyes: Negative.   Respiratory: Negative for chest tightness and shortness of breath.   Cardiovascular: Negative for chest pain.  Gastrointestinal: Negative for nausea and abdominal pain.  Genitourinary: Negative.  Negative for dysuria.  Musculoskeletal: Positive for back pain. Negative for joint swelling and  arthralgias.  Skin: Negative.  Negative for rash and wound.  Neurological: Negative for dizziness, tingling, weakness, light-headedness, numbness and headaches.  Hematological: Negative.   Psychiatric/Behavioral: Negative.     Physical Exam  BP 126/81  Pulse 80  Temp(Src) 98.1 F (36.7 C) (Oral)  Resp 20  Ht 5' (1.524 m)  Wt 150 lb (68.04 kg)  BMI 29.30 kg/m2  SpO2 100%  LMP 05/02/2011  Physical Exam  Constitutional: She is oriented to person, place, and time. She appears well-developed and well-nourished.  HENT:  Head: Normocephalic.  Eyes: Conjunctivae are normal.  Neck: Normal range of motion. Neck supple.  Cardiovascular: Regular rhythm and intact distal pulses.        Pedal pulses normal.  Pulmonary/Chest: Effort normal. She has no wheezes.  Abdominal: Soft. Bowel sounds are normal. She exhibits no distension and no mass.  Musculoskeletal: Normal range of motion. She exhibits no edema.       Lumbar back: She exhibits tenderness. She exhibits no swelling, no edema and no spasm.  Neurological: She is alert and oriented to person, place, and time. She has normal strength. She displays no atrophy and no tremor. No cranial nerve deficit or sensory deficit. Gait normal.  Reflex Scores:      Patellar reflexes are 2+ on the right side and 2+ on the left side.  Achilles reflexes are 2+ on the right side and 2+ on the left side.      No strength deficit noted in hip and knee flexor and extensor muscle groups.  Ankle flexion and extension intact.  Skin: Skin is warm and dry.  Psychiatric: She has a normal mood and affect.    ED Course  Procedures  MDM No neuro deficits on exam today.  Suspect return of lumbosacral strain she was seen for here 2 weeks ago - prior labs,  Note,  Ct scan reviewed.        Candis Musa, PA 05/29/11 1751  Medical screening examination/treatment/procedure(s) were performed by non-physician practitioner and as supervising physician I was  immediately available for consultation/collaboration. Orena Cavazos Susa Simmonds, MD 05/30/11 0005

## 2011-05-30 ENCOUNTER — Encounter (HOSPITAL_COMMUNITY): Payer: Self-pay

## 2011-05-30 ENCOUNTER — Emergency Department (HOSPITAL_COMMUNITY)
Admission: EM | Admit: 2011-05-30 | Discharge: 2011-05-30 | Disposition: A | Discharge status: Home / Self Care | Payer: Medicaid Other | Attending: Emergency Medicine | Admitting: Emergency Medicine

## 2011-05-30 DIAGNOSIS — G8929 Other chronic pain: Secondary | ICD-10-CM | POA: Insufficient documentation

## 2011-05-30 DIAGNOSIS — Z79899 Other long term (current) drug therapy: Secondary | ICD-10-CM | POA: Insufficient documentation

## 2011-05-30 DIAGNOSIS — M545 Low back pain, unspecified: Secondary | ICD-10-CM | POA: Insufficient documentation

## 2011-05-30 DIAGNOSIS — J45909 Unspecified asthma, uncomplicated: Secondary | ICD-10-CM | POA: Insufficient documentation

## 2011-05-30 DIAGNOSIS — R112 Nausea with vomiting, unspecified: Secondary | ICD-10-CM | POA: Insufficient documentation

## 2011-05-30 DIAGNOSIS — F17200 Nicotine dependence, unspecified, uncomplicated: Secondary | ICD-10-CM | POA: Insufficient documentation

## 2011-05-30 DIAGNOSIS — R1110 Vomiting, unspecified: Secondary | ICD-10-CM

## 2011-05-30 MED ORDER — HYDROMORPHONE HCL 1 MG/ML IJ SOLN
1.0000 mg | Freq: Once | INTRAMUSCULAR | Status: AC
  Administered 2011-05-30: 1 mg via INTRAVENOUS
  Filled 2011-05-30: qty 1

## 2011-05-30 MED ORDER — PROMETHAZINE HCL 25 MG/ML IJ SOLN
12.5000 mg | Freq: Once | INTRAMUSCULAR | Status: AC
  Administered 2011-05-30: 12.5 mg via INTRAVENOUS
  Filled 2011-05-30: qty 1

## 2011-05-30 MED ORDER — SODIUM CHLORIDE 0.9 % IV SOLN
INTRAVENOUS | Status: DC
  Administered 2011-05-30: 08:00:00 via INTRAVENOUS

## 2011-05-30 MED ORDER — SODIUM CHLORIDE 0.9 % IV BOLUS (SEPSIS)
250.0000 mL | Freq: Once | INTRAVENOUS | Status: AC
  Administered 2011-05-30: 1000 mL via INTRAVENOUS

## 2011-05-30 MED ORDER — PROMETHAZINE HCL 25 MG PO TABS: 25.0000 mg | ORAL_TABLET | Freq: Four times a day (QID) | ORAL | Status: DC | PRN

## 2011-05-30 NOTE — ED Notes (Signed)
Pt seen yesterday in fast track, pt says she was given an Rx for Norco but hasn't had it filled now she has pain radiating down the back of her leg, has a Hx of Sciatica.

## 2011-05-30 NOTE — ED Notes (Signed)
Pt requesting something for nausea.  States has been vomiting since last night.  No vomiting since arrival to ED.  C/o right sided lower back pain radiating to right buttock going down right leg.  Seen for same yesterday here.  Skin color pink.  Nad noted.  Denies abd pain/GU sx.

## 2011-05-30 NOTE — ED Notes (Signed)
Pt states is feeling better and wants to be d/c.  EDP notified.

## 2011-05-30 NOTE — ED Provider Notes (Signed)
History   Chart scribed for Bonnie Jakes, MD by Enos Fling; the patient was seen in room APA12/APA12; this patient's care was started at 7:33 AM.    CSN: [id number] Arrival date & time: 05/30/2011  6:02 AM  Chief Complaint  Patient presents with  . Emesis   HPI Bonnie Jensen is a 35 y.o. female who presents to the Emergency Department complaining of back pain and vomiting. Pt reports she was seen in ED yesterday for same complaints; d/c with norco rx but unable to fill because pharmacy closed. Pt states pain has been severe enough since yesterday to cause nausea and vomiting but states was not rx'd anything yesterday for nausea. No h/o vomiting d/t back pain in the past. Has been taking tylenol/motrin at home with no relief of pain. No fever, abd pain, diarrhea, or urinary complaints. Flare up of chronic back pain is unchanged in quality from usual chronic back pain radiating down RLE. No new injury or trauma. No numbness, tingling, weakness, or bowel/bladder incontinence.  Past Medical History  Diagnosis Date  . Asthma     Past Surgical History  Procedure Date  . Appendectomy   . Cholecystectomy   . Tonsillectomy   . Brain surgery   . Tubal ligation     History reviewed. No pertinent family history.  History  Substance Use Topics  . Smoking status: Current Everyday Smoker  . Smokeless tobacco: Not on file  . Alcohol Use: No    OB History    Grav Para Term Preterm Abortions TAB SAB Ect Mult Living                 Previous Medications   ACETAMINOPHEN (TYLENOL) 325 MG TABLET    Take 650 mg by mouth every 6 (six) hours as needed. For pain    HYDROCODONE-ACETAMINOPHEN (NORCO) 5-325 MG PER TABLET    Take 1 tablet by mouth every 4 (four) hours as needed for pain.   IBUPROFEN (ADVIL) 200 MG TABLET    Take 400 mg by mouth every 6 (six) hours as needed. For pain    IBUPROFEN (ADVIL,MOTRIN) 600 MG TABLET    Take 1 tablet (600 mg total) by mouth every 6 (six) hours as  needed for pain.     Allergies as of 05/30/2011 - Review Complete 05/30/2011  Allergen Reaction Noted  . Imitrex (sumatriptan base) Other (See Comments) 05/15/2011  . Midrin Hives 05/15/2011  . Toradol Other (See Comments) 05/15/2011      Review of Systems  Constitutional: Negative for fever and chills.  HENT: Negative for congestion and rhinorrhea.   Eyes: Negative for redness.  Respiratory: Negative for cough and shortness of breath.   Cardiovascular: Negative for chest pain and leg swelling.  Gastrointestinal: Positive for nausea and vomiting. Negative for abdominal pain and diarrhea.  Genitourinary: Negative for dysuria and flank pain.  Musculoskeletal: Positive for back pain.  Skin: Negative for rash.  Neurological: Negative for weakness, numbness and headaches.    Physical Exam  BP 102/53  Pulse 62  Temp(Src) 98.3 F (36.8 C) (Oral)  Resp 16  SpO2 97%  LMP 05/02/2011  Physical Exam  Nursing note and vitals reviewed. Constitutional: She is oriented to person, place, and time. She appears well-developed and well-nourished. No distress.  HENT:  Head: Normocephalic and atraumatic.  Eyes: Conjunctivae and EOM are normal. Pupils are equal, round, and reactive to light.  Neck: Normal range of motion. Neck supple.  Cardiovascular: Normal rate, regular rhythm  and normal heart sounds.   Pulmonary/Chest: Effort normal and breath sounds normal. She has no wheezes. She exhibits no tenderness.  Abdominal: Soft. Bowel sounds are normal. There is no tenderness.  Musculoskeletal: Normal range of motion. She exhibits no edema and no tenderness.       Right paraspinal lumbar tenderness, no midline point tenderness  Lymphadenopathy:    She has no cervical adenopathy.  Neurological: She is alert and oriented to person, place, and time. She has normal strength. No cranial nerve deficit or sensory deficit.  Skin: Skin is warm and dry. No rash noted.  Psychiatric: She has a normal mood  and affect.    ED Course  Procedures  OTHER DATA REVIEWED: Nursing notes and vital signs reviewed. Prior records reviewed.   MDM: NAUSEA IMPROVED HX OF BACK PROBLEMS ALSO BETTER WITH MEDS IN ED.   11:00 AM - pt feels much better after meds in ED, comfortable with d/c.    IMPRESSION: 1. Low back pain   2. Emesis      PLAN: discharge All results reviewed and discussed with pt, questions answered, pt agreeable with plan.   CONDITION ON DISCHARGE: improved   MEDS GIVEN IN ED:  Medications  0.9 %  sodium chloride infusion (  Intravenous New Bag 05/30/11 0803)  sodium chloride 0.9 % bolus 250 mL (1000 mL Intravenous Given 05/30/11 0803)  promethazine (PHENERGAN) injection 12.5 mg (12.5 mg Intravenous Given 05/30/11 0802)  HYDROmorphone (DILAUDID) injection 1 mg (1 mg Intravenous Given 05/30/11 0905)     DISCHARGE MEDICATIONS: New Prescriptions   No medications on file     SCRIBE ATTESTATION: I personally performed the services described in this documentation, which was scribed in my presence. The recorded information has been reviewed and considered. Bonnie Jakes, MD       Bonnie Jakes, MD 05/30/11 [phone]

## 2011-05-30 NOTE — ED Notes (Signed)
Pt states she was seen in the e.d. On sat for lower back pain and vomiting, states is not any better

## 2011-06-18 ENCOUNTER — Emergency Department (HOSPITAL_COMMUNITY)
Admission: EM | Admit: 2011-06-18 | Discharge: 2011-06-18 | Disposition: A | Discharge status: Home / Self Care | Payer: Medicaid Other | Attending: Emergency Medicine | Admitting: Emergency Medicine

## 2011-06-18 ENCOUNTER — Emergency Department (HOSPITAL_COMMUNITY): Payer: Medicaid Other

## 2011-06-18 ENCOUNTER — Encounter (HOSPITAL_COMMUNITY): Payer: Self-pay | Admitting: Emergency Medicine

## 2011-06-18 DIAGNOSIS — J45909 Unspecified asthma, uncomplicated: Secondary | ICD-10-CM | POA: Insufficient documentation

## 2011-06-18 DIAGNOSIS — F17200 Nicotine dependence, unspecified, uncomplicated: Secondary | ICD-10-CM | POA: Insufficient documentation

## 2011-06-18 DIAGNOSIS — R112 Nausea with vomiting, unspecified: Secondary | ICD-10-CM | POA: Insufficient documentation

## 2011-06-18 DIAGNOSIS — R509 Fever, unspecified: Secondary | ICD-10-CM | POA: Insufficient documentation

## 2011-06-18 DIAGNOSIS — R109 Unspecified abdominal pain: Secondary | ICD-10-CM | POA: Insufficient documentation

## 2011-06-18 DIAGNOSIS — J3489 Other specified disorders of nose and nasal sinuses: Secondary | ICD-10-CM | POA: Insufficient documentation

## 2011-06-18 DIAGNOSIS — N12 Tubulo-interstitial nephritis, not specified as acute or chronic: Secondary | ICD-10-CM

## 2011-06-18 LAB — BASIC METABOLIC PANEL
Calcium: 8.6 mg/dL (ref 8.4–10.5)
GFR calc Af Amer: >60 mL/min (ref >60)
GFR calc non Af Amer: >60 mL/min (ref >60)
Glucose, Bld: 100 mg/dL — ABNORMAL HIGH (ref 70–99)
Potassium: 2.7 mEq/L — CL (ref 3.5–5.1)
Sodium: 138 mEq/L (ref 135–145)

## 2011-06-18 LAB — URINALYSIS, ROUTINE W REFLEX MICROSCOPIC
Bilirubin Urine: NEGATIVE
Glucose, UA: NEGATIVE mg/dL
Protein, ur: NEGATIVE mg/dL

## 2011-06-18 LAB — DIFFERENTIAL
Band Neutrophils: 0 % (ref 0–10)
Basophils Absolute: 0.4 10*3/uL — ABNORMAL HIGH (ref 0.0–0.1)
Basophils Relative: 3 % — ABNORMAL HIGH (ref 0–1)
Blasts: 0 %
Lymphocytes Relative: 12 % (ref 12–46)
Lymphs Abs: 1.6 10*3/uL (ref 0.7–4.0)
Metamyelocytes Relative: 0 %
Monocytes Absolute: 0.9 10*3/uL (ref 0.1–1.0)
Monocytes Relative: 7 % (ref 3–12)

## 2011-06-18 LAB — CBC
HCT: 34 % — ABNORMAL LOW (ref 36.0–46.0)
Hemoglobin: 11.9 g/dL — ABNORMAL LOW (ref 12.0–15.0)
MCHC: 35 g/dL (ref 30.0–36.0)
MCV: 92.4 fL (ref 78.0–100.0)
RDW: 13.6 % (ref 11.5–15.5)
WBC: 13 10*3/uL — ABNORMAL HIGH (ref 4.0–10.5)

## 2011-06-18 LAB — URINE MICROSCOPIC-ADD ON

## 2011-06-18 MED ORDER — SODIUM CHLORIDE 0.9 % IV BOLUS (SEPSIS)
1000.0000 mL | Freq: Once | INTRAVENOUS | Status: AC
  Administered 2011-06-18: 1000 mL via INTRAVENOUS

## 2011-06-18 MED ORDER — ACETAMINOPHEN 500 MG PO TABS
ORAL_TABLET | ORAL | Status: AC
  Filled 2011-06-18: qty 2

## 2011-06-18 MED ORDER — CIPROFLOXACIN IN D5W 400 MG/200ML IV SOLN
400.0000 mg | Freq: Once | INTRAVENOUS | Status: AC
  Administered 2011-06-18: 400 mg via INTRAVENOUS
  Filled 2011-06-18: qty 200

## 2011-06-18 MED ORDER — SODIUM CHLORIDE 0.9 % IV SOLN: Freq: Once | INTRAVENOUS | Status: DC

## 2011-06-18 MED ORDER — ACETAMINOPHEN 500 MG PO TABS: 1000.0000 mg | ORAL_TABLET | Freq: Once | ORAL | Status: DC

## 2011-06-18 MED ORDER — PROMETHAZINE HCL 25 MG PO TABS: 25.0000 mg | ORAL_TABLET | Freq: Four times a day (QID) | ORAL | Status: DC | PRN

## 2011-06-18 MED ORDER — ONDANSETRON HCL 4 MG/2ML IJ SOLN
4.0000 mg | Freq: Once | INTRAMUSCULAR | Status: AC
  Administered 2011-06-18: 4 mg via INTRAVENOUS
  Filled 2011-06-18: qty 2

## 2011-06-18 MED ORDER — HYDROCODONE-ACETAMINOPHEN 5-325 MG PO TABS: 1.0000 | ORAL_TABLET | ORAL | Status: DC | PRN

## 2011-06-18 MED ORDER — HYDROMORPHONE HCL 1 MG/ML IJ SOLN
1.0000 mg | Freq: Once | INTRAMUSCULAR | Status: AC
  Administered 2011-06-18: 1 mg via INTRAVENOUS
  Filled 2011-06-18: qty 1

## 2011-06-18 MED ORDER — CIPROFLOXACIN HCL 500 MG PO TABS: 500.0000 mg | ORAL_TABLET | Freq: Two times a day (BID) | ORAL | Status: AC

## 2011-06-18 NOTE — ED Notes (Signed)
Potassium is 2.7 reported to Dr Colon Branch

## 2011-06-18 NOTE — ED Notes (Signed)
Patient c/o fever and vomiting x 2 days and states now has squeezing sensation in ribs and chest.

## 2011-06-18 NOTE — ED Notes (Signed)
Fever, cough, abd pain , nausea. Vomiting today.

## 2011-06-18 NOTE — ED Provider Notes (Addendum)
History     CSN: [id number] Arrival date & time: 06/18/2011 12:44 AM   Chief Complaint  Patient presents with  . Cough  . Nasal Congestion  . Emesis     (Include location/radiation/quality/duration/timing/severity/associated sxs/prior treatment) HPI Comments: Seen 60  Patient is a 35 y.o. female presenting with vomiting. The history is provided by the patient.  Emesis  This is a new (Patient states she has had fever, chills, back pain and vomiting for two days. ) problem. The current episode started 2 days ago. The problem has been gradually worsening. The maximum temperature recorded prior to her arrival was 101 to 101.9 F. The fever has been present for 1 to 2 days. Associated symptoms include arthralgias, chills, a fever, headaches, myalgias and sweats. Pertinent negatives include no cough and no URI. Associated symptoms comments: Back pain.     Past Medical History  Diagnosis Date  . Asthma      Past Surgical History  Procedure Date  . Appendectomy   . Cholecystectomy   . Tonsillectomy   . Brain surgery   . Tubal ligation     No family history on file.  History  Substance Use Topics  . Smoking status: Current Everyday Smoker  . Smokeless tobacco: Not on file  . Alcohol Use: No    OB History    Grav Para Term Preterm Abortions TAB SAB Ect Mult Living                  Review of Systems  Constitutional: Positive for fever and chills.  Respiratory: Negative for cough.   Gastrointestinal: Positive for vomiting.  Musculoskeletal: Positive for myalgias and arthralgias.  Neurological: Positive for headaches.  All other systems reviewed and are negative.    Allergies  Imitrex; Midrin; and Toradol  Home Medications   Current Outpatient Rx  Name Route Sig Dispense Refill  . ACETAMINOPHEN 325 MG PO TABS Oral Take 650 mg by mouth every 6 (six) hours as needed. For pain     . IBUPROFEN 200 MG PO TABS Oral Take 400 mg by mouth every 6 (six) hours as  needed. For pain       Physical Exam    BP 102/59  Pulse 87  Temp(Src) 99.8 F (37.7 C) (Oral)  Resp 20  Ht 5' (1.524 m)  Wt 150 lb (68.04 kg)  BMI 29.30 kg/m2  SpO2 97%  LMP 05/30/2011  Physical Exam  Nursing note and vitals reviewed. Constitutional: She is oriented to person, place, and time. She appears well-developed and well-nourished. She appears distressed.  HENT:  Head: Normocephalic and atraumatic.  Eyes: EOM are normal.  Neck: Normal range of motion.  Cardiovascular: Normal heart sounds and intact distal pulses.        tachycardic  Pulmonary/Chest: Effort normal and breath sounds normal.  Abdominal: Soft.  Genitourinary:       left cva tenderness  Musculoskeletal: Normal range of motion.  Neurological: She is alert and oriented to person, place, and time.  Skin: Skin is warm. She is diaphoretic.    ED Course  Procedures  Results for orders placed during the hospital encounter of 06/18/11  URINALYSIS, ROUTINE W REFLEX MICROSCOPIC      Component Value Range   Color, Urine YELLOW  YELLOW    Appearance CLEAR  CLEAR    Specific Gravity, Urine 1.010  1.005 - 1.030    pH 6.0  5.0 - 8.0    Glucose, UA NEGATIVE  NEGATIVE (mg/dL)  Hgb urine dipstick MODERATE (*) NEGATIVE    Bilirubin Urine NEGATIVE  NEGATIVE    Ketones, ur TRACE (*) NEGATIVE (mg/dL)   Protein, ur NEGATIVE  NEGATIVE (mg/dL)   Urobilinogen, UA 0.2  0.0 - 1.0 (mg/dL)   Nitrite POSITIVE (*) NEGATIVE    Leukocytes, UA TRACE (*) NEGATIVE   CBC      Component Value Range   WBC 13.0 (*) 4.0 - 10.5 (K/uL)   RBC 3.68 (*) 3.87 - 5.11 (MIL/uL)   Hemoglobin 11.9 (*) 12.0 - 15.0 (g/dL)   HCT 16.1 (*) 09.6 - 46.0 (%)   MCV 92.4  78.0 - 100.0 (fL)   MCH 32.3  26.0 - 34.0 (pg)   MCHC 35.0  30.0 - 36.0 (g/dL)   RDW 04.5  40.9 - 81.1 (%)   Platelets 283  150 - 400 (K/uL)  DIFFERENTIAL      Component Value Range   Neutrophils Relative 77  43 - 77 (%)   Lymphocytes Relative 12  12 - 46 (%)   Monocytes  Relative 7  3 - 12 (%)   Eosinophils Relative 1  0 - 5 (%)   Basophils Relative 3 (*) 0 - 1 (%)   Band Neutrophils 0  0 - 10 (%)   Metamyelocytes Relative 0     Myelocytes 0     Promyelocytes Absolute 0     Blasts 0     nRBC 0  0 (/100 WBC)   Neutro Abs 10.0 (*) 1.7 - 7.7 (K/uL)   Lymphs Abs 1.6  0.7 - 4.0 (K/uL)   Monocytes Absolute 0.9  0.1 - 1.0 (K/uL)   Eosinophils Absolute 0.1  0.0 - 0.7 (K/uL)   Basophils Absolute 0.4 (*) 0.0 - 0.1 (K/uL)  BASIC METABOLIC PANEL      Component Value Range   Sodium 138  135 - 145 (mEq/L)   Potassium 2.7 (*) 3.5 - 5.1 (mEq/L)   Chloride 101  96 - 112 (mEq/L)   CO2 24  19 - 32 (mEq/L)   Glucose, Bld 100 (*) 70 - 99 (mg/dL)   BUN 10  6 - 23 (mg/dL)   Creatinine, Ser 9.14  0.50 - 1.10 (mg/dL)   Calcium 8.6  8.4 - 78.2 (mg/dL)   GFR calc non Af Amer >60  >60 (mL/min)   GFR calc Af Amer >60  >60 (mL/min)  URINE MICROSCOPIC-ADD ON      Component Value Range   Squamous Epithelial / LPF RARE  RARE    WBC, UA 7-10  <3 (WBC/hpf)   RBC / HPF 3-6  <3 (RBC/hpf)   Bacteria, UA FEW (*) RARE    Dg Chest 2 View  06/18/2011  *RADIOLOGY REPORT*  Clinical Data:  Congestion, fever, nausea, vomiting  CHEST - 2 VIEW  Comparison: 07/27/2010  Findings: Normal heart size, mediastinal contours, and pulmonary vascularity. Mild peribronchial thickening. No pulmonary infiltrate, pleural effusion, or pneumothorax. Bones unremarkable.  IMPRESSION: Mild chronic bronchitic changes.  Original Report Authenticated By: Lollie Marrow, M.D.  MDM Patient with 3 days of nausea, vomiting and left flank discomfort, fevers. Febrile on arrival with mild left flank pain. Labs with urinary findings c/w uti/early pylonephitis. Patient  Given IVF, antibiotics. She took PO fluids. Fever responded to antiemetic.Patient  informed of clinical course, understand medical decision-making process, and agree with plan.Pt feels improved after observation and/or treatment in ED.Pt stable in ED with no  significant deterioration in condition. MDM Reviewed: previous chart, nursing note and  vitals Reviewed previous: labs, x-ray and CT scan Interpretation: labs and x-ray Total time providing critical care: < 30 minutes. This excludes time spent performing separately reportable procedures and services.         Nicoletta Dress. Colon Branch, MD 06/18/11 0451  Nicoletta Dress. Colon Branch, MD 07/04/11 [phone]

## 2011-06-18 NOTE — ED Notes (Signed)
Says she is feeling better, taking po flds.

## 2011-06-20 ENCOUNTER — Emergency Department (HOSPITAL_COMMUNITY)
Admission: EM | Admit: 2011-06-20 | Discharge: 2011-06-20 | Disposition: A | Discharge status: Home / Self Care | Payer: Medicaid Other | Attending: Emergency Medicine | Admitting: Emergency Medicine

## 2011-06-20 ENCOUNTER — Encounter (HOSPITAL_COMMUNITY): Payer: Self-pay

## 2011-06-20 ENCOUNTER — Emergency Department (HOSPITAL_COMMUNITY): Payer: Medicaid Other

## 2011-06-20 DIAGNOSIS — R109 Unspecified abdominal pain: Secondary | ICD-10-CM

## 2011-06-20 DIAGNOSIS — E876 Hypokalemia: Secondary | ICD-10-CM | POA: Insufficient documentation

## 2011-06-20 DIAGNOSIS — R112 Nausea with vomiting, unspecified: Secondary | ICD-10-CM

## 2011-06-20 DIAGNOSIS — R1031 Right lower quadrant pain: Secondary | ICD-10-CM | POA: Insufficient documentation

## 2011-06-20 DIAGNOSIS — R1011 Right upper quadrant pain: Secondary | ICD-10-CM | POA: Insufficient documentation

## 2011-06-20 LAB — DIFFERENTIAL
Basophils Relative: 0 % (ref 0–1)
Eosinophils Absolute: 0.1 10*3/uL (ref 0.0–0.7)
Eosinophils Relative: 1 % (ref 0–5)
Lymphs Abs: 2.5 10*3/uL (ref 0.7–4.0)
Neutrophils Relative %: 75 % (ref 43–77)

## 2011-06-20 LAB — BASIC METABOLIC PANEL
CO2: 31 mEq/L (ref 19–32)
Calcium: 8.4 mg/dL (ref 8.4–10.5)
Creatinine, Ser: 0.8 mg/dL (ref 0.50–1.10)
GFR calc non Af Amer: >60 mL/min (ref >60)
Sodium: 142 mEq/L (ref 135–145)

## 2011-06-20 LAB — URINALYSIS, ROUTINE W REFLEX MICROSCOPIC
Glucose, UA: NEGATIVE mg/dL
Ketones, ur: NEGATIVE mg/dL
Leukocytes, UA: NEGATIVE
Protein, ur: NEGATIVE mg/dL
Urobilinogen, UA: 0.2 mg/dL (ref 0.0–1.0)

## 2011-06-20 LAB — CBC
MCH: 32.5 pg (ref 26.0–34.0)
MCHC: 34.3 g/dL (ref 30.0–36.0)
MCV: 94.7 fL (ref 78.0–100.0)
Platelets: 415 10*3/uL — ABNORMAL HIGH (ref 150–400)
RBC: 3.2 MIL/uL — ABNORMAL LOW (ref 3.87–5.11)
RDW: 13.9 % (ref 11.5–15.5)

## 2011-06-20 MED ORDER — ONDANSETRON HCL 4 MG/2ML IJ SOLN
4.0000 mg | Freq: Once | INTRAMUSCULAR | Status: AC
  Administered 2011-06-20: 4 mg via INTRAVENOUS
  Filled 2011-06-20: qty 2

## 2011-06-20 MED ORDER — MORPHINE SULFATE 4 MG/ML IJ SOLN
4.0000 mg | Freq: Once | INTRAMUSCULAR | Status: AC
  Administered 2011-06-20: 4 mg via INTRAVENOUS
  Filled 2011-06-20: qty 1

## 2011-06-20 MED ORDER — SODIUM CHLORIDE 0.9 % IV SOLN
INTRAVENOUS | Status: DC
  Administered 2011-06-20: 09:00:00 via INTRAVENOUS

## 2011-06-20 MED ORDER — IOHEXOL 300 MG/ML  SOLN
100.0000 mL | Freq: Once | INTRAMUSCULAR | Status: AC | PRN
  Administered 2011-06-20: 100 mL via INTRAVENOUS

## 2011-06-20 MED ORDER — POTASSIUM CHLORIDE CRYS ER 20 MEQ PO TBCR
40.0000 meq | EXTENDED_RELEASE_TABLET | Freq: Once | ORAL | Status: AC
  Administered 2011-06-20: 40 meq via ORAL
  Filled 2011-06-20: qty 2

## 2011-06-20 MED ORDER — OXYCODONE-ACETAMINOPHEN 5-325 MG PO TABS: 2.0000 | ORAL_TABLET | ORAL | Status: DC | PRN

## 2011-06-20 MED ORDER — METOCLOPRAMIDE HCL 10 MG PO TABS: 10.0000 mg | ORAL_TABLET | Freq: Four times a day (QID) | ORAL | Status: DC

## 2011-06-20 NOTE — ED Notes (Signed)
Pt unable to obtain urine specimen at this time 

## 2011-06-20 NOTE — ED Notes (Signed)
Pt seen Saturday morning for kidney infection per pt. Given Zithromax, Phenergan and Hydrocodone. Pt unable to keep medication down d/t vomiting.

## 2011-06-20 NOTE — ED Notes (Signed)
Pt finished drinking contrast. CT made aware.

## 2011-06-20 NOTE — ED Notes (Signed)
CRITICAL VALUE ALERT  Critical value received:  Potassium 2.3  Date of notification:  06/20/2011  Time of notification:  0949  Critical value read back: yes  Nurse who received alert:  Arva Chafe RN  MD notified (1st page):  Caparossi  Time of first page:  [phone]  MD notified (2nd page):  Time of second page:  Responding MD:  Nino Parsley  Time MD responded:  [phone]

## 2011-06-20 NOTE — ED Provider Notes (Addendum)
Scribed for Bonnie Stairs, MD, the patient was seen in room APA19/APA19 . This chart was scribed by Ellie Lunch. This patient's care was started at 8:50 AM.   CSN: [id number] Arrival date & time: 06/20/2011  8:46 AM   Chief Complaint  Patient presents with  . Emesis    (Include location/radiation/quality/duration/timing/severity/associated sxs/prior treatment) HPI Bonnie Jensen is a 35 y.o. female who presents to the Emergency Department complaining of emesis for the past week with associated LUQ/LLQ abdominal pain described as severe and constant, nausea. Also c/o fever and diarrhea starting this morning. Denies rashes, injury or fall. Additionally Pt reports she was seen in ED on 06/18/2011 for a reported kidney infection with hematuria and dry cough. Pt was given zithromax, phenergan, and hydrocodone, but is unable to keep medication down due to emesis.    No PCP  Past Medical History  Diagnosis Date  . Asthma     Past Surgical History  Procedure Date  . Appendectomy   . Cholecystectomy   . Tonsillectomy   . Brain surgery   . Tubal ligation     No family history on file.  History  Substance Use Topics  . Smoking status: Current Everyday Smoker  . Smokeless tobacco: Not on file  . Alcohol Use: No    Review of Systems 10 Systems reviewed and are negative for acute change except as noted in the HPI.  Allergies  Imitrex; Midrin; and Toradol  Home Medications   Current Outpatient Rx  Name Route Sig Dispense Refill  . ACETAMINOPHEN 325 MG PO TABS Oral Take 650 mg by mouth every 6 (six) hours as needed. For pain     . CIPROFLOXACIN HCL 500 MG PO TABS Oral Take 1 tablet (500 mg total) by mouth 2 (two) times daily. 14 tablet 0  . HYDROCODONE-ACETAMINOPHEN 5-325 MG PO TABS Oral Take 1 tablet by mouth every 4 (four) hours as needed for pain. 15 tablet 0  . IBUPROFEN 200 MG PO TABS Oral Take 400 mg by mouth every 6 (six) hours as needed. For pain     . PROMETHAZINE  HCL 25 MG PO TABS Oral Take 1 tablet (25 mg total) by mouth every 6 (six) hours as needed for nausea. 20 tablet 0    Physical Exam    BP 110/76  Pulse 92  Temp(Src) 98.9 F (37.2 C) (Oral)  Resp 20  Ht 5' (1.524 m)  Wt 150 lb (68.04 kg)  BMI 29.30 kg/m2  SpO2 97%  LMP 05/30/2011  Physical Exam  Nursing note and vitals reviewed. Constitutional: She is oriented to person, place, and time. She appears well-developed and well-nourished.  HENT:  Head: Normocephalic and atraumatic.  Eyes: Conjunctivae and EOM are normal.  Neck: Normal range of motion.  Cardiovascular: Normal rate, regular rhythm, normal heart sounds and intact distal pulses.        No R/L bruit  Pulmonary/Chest: Effort normal and breath sounds normal.  Abdominal: Soft. Bowel sounds are normal.       LUQ/LLQ tenderness. No tenderness on right.   Musculoskeletal: Normal range of motion. She exhibits no edema.  Neurological: She is alert and oriented to person, place, and time.  Skin: Skin is warm and dry.  Psychiatric: She has a normal mood and affect.   Procedures  OTHER DATA REVIEWED: Nursing notes, vital signs, and past medical records reviewed.  DIAGNOSTIC STUDIES: Oxygen Saturation is 97% on room air, nromal by my interpretation.    LABS /  RADIOLOGY:  Results for orders placed during the hospital encounter of 06/20/11  CBC      Component Value Range   WBC 15.9 (*) 4.0 - 10.5 (K/uL)   RBC 3.20 (*) 3.87 - 5.11 (MIL/uL)   Hemoglobin 10.4 (*) 12.0 - 15.0 (g/dL)   HCT 14.7 (*) 82.9 - 46.0 (%)   MCV 94.7  78.0 - 100.0 (fL)   MCH 32.5  26.0 - 34.0 (pg)   MCHC 34.3  30.0 - 36.0 (g/dL)   RDW 56.2  13.0 - 86.5 (%)   Platelets 415 (*) 150 - 400 (K/uL)  DIFFERENTIAL      Component Value Range   Neutrophils Relative 75  43 - 77 (%)   Neutro Abs 11.9 (*) 1.7 - 7.7 (K/uL)   Lymphocytes Relative 16  12 - 46 (%)   Lymphs Abs 2.5  0.7 - 4.0 (K/uL)   Monocytes Relative 8  3 - 12 (%)   Monocytes Absolute 1.3 (*)  0.1 - 1.0 (K/uL)   Eosinophils Relative 1  0 - 5 (%)   Eosinophils Absolute 0.1  0.0 - 0.7 (K/uL)   Basophils Relative 0  0 - 1 (%)   Basophils Absolute 0.0  0.0 - 0.1 (K/uL)   WBC Morphology ATYPICAL LYMPHOCYTES    BASIC METABOLIC PANEL      Component Value Range   Sodium 142  135 - 145 (mEq/L)   Potassium 2.3 (*) 3.5 - 5.1 (mEq/L)   Chloride 100  96 - 112 (mEq/L)   CO2 31  19 - 32 (mEq/L)   Glucose, Bld 99  70 - 99 (mg/dL)   BUN 6  6 - 23 (mg/dL)   Creatinine, Ser 7.84  0.50 - 1.10 (mg/dL)   Calcium 8.4  8.4 - 69.6 (mg/dL)   GFR calc non Af Amer >60  >60 (mL/min)   GFR calc Af Amer >60  >60 (mL/min)  URINALYSIS, ROUTINE W REFLEX MICROSCOPIC      Component Value Range   Color, Urine YELLOW  YELLOW    Appearance CLEAR  CLEAR    Specific Gravity, Urine 1.005  1.005 - 1.030    pH 6.5  5.0 - 8.0    Glucose, UA NEGATIVE  NEGATIVE (mg/dL)   Hgb urine dipstick TRACE (*) NEGATIVE    Bilirubin Urine NEGATIVE  NEGATIVE    Ketones, ur NEGATIVE  NEGATIVE (mg/dL)   Protein, ur NEGATIVE  NEGATIVE (mg/dL)   Urobilinogen, UA 0.2  0.0 - 1.0 (mg/dL)   Nitrite NEGATIVE  NEGATIVE    Leukocytes, UA NEGATIVE  NEGATIVE   PREGNANCY, URINE      Component Value Range   Preg Test, Ur NEGATIVE    URINE MICROSCOPIC-ADD ON      Component Value Range   Squamous Epithelial / LPF FEW (*) RARE    RBC / HPF 3-6  <3 (RBC/hpf)   Ct Abdomen Pelvis W Contrast  06/20/2011  *RADIOLOGY REPORT*  Clinical Data: Left-sided abdominal pain, T T P, question diverticulitis  CT ABDOMEN AND PELVIS WITH CONTRAST  Technique:  Multidetector CT imaging of the abdomen and pelvis was performed following the standard protocol during bolus administration of intravenous contrast. Sagittal and coronal MPR images reconstructed from axial data set.  Contrast: OMNIPAQUE IOHEXOL 300 MG/ML IV SOLN; Dilute oral contrast.  Comparison: Noncontrast CT abdomen pelvis 05/15/2011  Findings: Minimal dependent atelectasis left lung base.  Liver, spleen, pancreas, and adrenal glands normal appearance. Gallbladder and appendix surgically absent. 13 x  11 mm and 11 x 9 mm low to intermediate attenuation foci and left kidney, indeterminate, not definitely seen on the previous exam though previous assessment was limited by noncontrast technique. At several sites within both kidneys left greater than right, the cortical nephrograms are slightly indistinct, raising question of pyelonephritis. Minimal fluid or thickening at Gerota's fascia at inferior to right kidney.  Retroaortic left renal vein. Small amount nonspecific low attenuation free pelvic fluid. Small calcified nodule within right ovary. Uterus, adnexae, and bladder otherwise normal. Stomach and bowel loops normal appearance. No mass, adenopathy, or hernia. No acute osseous findings.  IMPRESSION: Areas of indistinct cortical nephrograms and kidneys, primarily on the left, cannot exclude pyelonephritis; correlation with urinalysis recommended. Questionable low to intermediate attenuation foci within the left kidney, nonspecific, not definitely seen on prior noncontrast exam. These are indeterminate in etiology; consider follow-up sonographic or MR imaging in 1-2 months, following resolution of the patient's current acute symptoms, to reassess.  Original Report Authenticated By: Lollie Marrow, M.D.    ED COURSE / COORDINATION OF CARE: 8:50 Discussed with Pt treatment and diagnostic plan Medicine for pain and vomiting. Blood tests and UA. EDP ordered the following: Orders Placed This Encounter  Procedures  . CBC  . Differential  . Basic metabolic panel  . Urinalysis, Routine w reflex microscopic  . Pregnancy, urine   Medications  0.9 %  sodium chloride infusion (  Intravenous New Bag 06/20/11 0910)  morphine 4 MG/ML injection 4 mg (4 mg Intravenous Given 06/20/11 0908)  ondansetron (ZOFRAN) injection 4 mg (4 mg Intravenous Given 06/20/11 0907)   9:49 Lab results: Pt has low potassium  2.3. Ordered potassium chloride SA (K-DUR,KLOR-CON) CR tablet 40 mEq Once  11:03 Pt recheck. Pt reports some improvement but still reports pain and nausea. EDP ordered additional meds to treat pain and nausea: morphine 4mg  injection and zofran 4 mg injection.  Also ordered abd. CT scan to rule out diverticulitis.  13:40Pt recheck. Discussed lab and diagnostic results. Discussed plan to discharge with medications for nausea.   MDM:  Abdominal pain with nausea and vomiting Mild hypokalemia treated in the emergency department.  No acute abdomen or toxicity. Symptoms improved in the emergency room.  SCRIBE ATTESTATION: I personally performed the services described in this documentation, which was scribed in my presence. The recorded information has been reviewed and considered. Bonnie Stairs, MD           Bonnie Stairs, MD 06/20/11 1404  Bonnie Stairs, MD 06/20/11 1404

## 2011-06-20 NOTE — ED Notes (Signed)
MD Caporossi made aware of critical potassium 2.3

## 2011-06-22 ENCOUNTER — Encounter (HOSPITAL_COMMUNITY): Payer: Self-pay

## 2011-06-22 ENCOUNTER — Inpatient Hospital Stay (HOSPITAL_COMMUNITY)
Admission: EM | Admit: 2011-06-22 | Discharge: 2011-06-24 | DRG: 690 | Disposition: A | Discharge status: Home / Self Care | Payer: Medicaid Other | Attending: Internal Medicine | Admitting: Internal Medicine

## 2011-06-22 ENCOUNTER — Emergency Department (HOSPITAL_COMMUNITY): Payer: Medicaid Other

## 2011-06-22 DIAGNOSIS — N12 Tubulo-interstitial nephritis, not specified as acute or chronic: Principal | ICD-10-CM | POA: Diagnosis present

## 2011-06-22 DIAGNOSIS — E876 Hypokalemia: Secondary | ICD-10-CM | POA: Diagnosis present

## 2011-06-22 DIAGNOSIS — F17200 Nicotine dependence, unspecified, uncomplicated: Secondary | ICD-10-CM | POA: Diagnosis present

## 2011-06-22 DIAGNOSIS — K529 Noninfective gastroenteritis and colitis, unspecified: Secondary | ICD-10-CM

## 2011-06-22 DIAGNOSIS — Z720 Tobacco use: Secondary | ICD-10-CM | POA: Diagnosis present

## 2011-06-22 DIAGNOSIS — J45909 Unspecified asthma, uncomplicated: Secondary | ICD-10-CM | POA: Diagnosis present

## 2011-06-22 LAB — URINALYSIS, ROUTINE W REFLEX MICROSCOPIC
Bilirubin Urine: NEGATIVE
Nitrite: NEGATIVE
Specific Gravity, Urine: 1.015 (ref 1.005–1.030)
Urobilinogen, UA: 0.2 mg/dL (ref 0.0–1.0)

## 2011-06-22 LAB — COMPREHENSIVE METABOLIC PANEL
Albumin: 2.8 g/dL — ABNORMAL LOW (ref 3.5–5.2)
Alkaline Phosphatase: 165 U/L — ABNORMAL HIGH (ref 39–117)
BUN: 8 mg/dL (ref 6–23)
Creatinine, Ser: 0.61 mg/dL (ref 0.50–1.10)
Potassium: 3.1 mEq/L — ABNORMAL LOW (ref 3.5–5.1)
Total Protein: 7.2 g/dL (ref 6.0–8.3)

## 2011-06-22 LAB — CBC
HCT: 30.3 % — ABNORMAL LOW (ref 36.0–46.0)
MCV: 95.6 fL (ref 78.0–100.0)
RBC: 3.17 MIL/uL — ABNORMAL LOW (ref 3.87–5.11)
WBC: 16.3 10*3/uL — ABNORMAL HIGH (ref 4.0–10.5)

## 2011-06-22 MED ORDER — HYDROMORPHONE HCL 1 MG/ML IJ SOLN
1.0000 mg | Freq: Once | INTRAMUSCULAR | Status: AC
  Administered 2011-06-22: 1 mg via INTRAVENOUS
  Filled 2011-06-22: qty 1

## 2011-06-22 MED ORDER — HYDROMORPHONE HCL 1 MG/ML IJ SOLN
INTRAMUSCULAR | Status: AC
  Filled 2011-06-22: qty 1

## 2011-06-22 MED ORDER — ONDANSETRON HCL 4 MG/2ML IJ SOLN
4.0000 mg | Freq: Four times a day (QID) | INTRAMUSCULAR | Status: DC | PRN
  Administered 2011-06-23: 4 mg via INTRAVENOUS
  Filled 2011-06-22: qty 2

## 2011-06-22 MED ORDER — ACETAMINOPHEN 325 MG PO TABS: 650.0000 mg | ORAL_TABLET | Freq: Four times a day (QID) | ORAL | Status: DC | PRN

## 2011-06-22 MED ORDER — CEFTRIAXONE SODIUM 1 G IJ SOLR
INTRAMUSCULAR | Status: AC
  Filled 2011-06-22: qty 1

## 2011-06-22 MED ORDER — CIPROFLOXACIN IN D5W 400 MG/200ML IV SOLN
400.0000 mg | Freq: Once | INTRAVENOUS | Status: AC
  Administered 2011-06-22: 400 mg via INTRAVENOUS
  Filled 2011-06-22: qty 200

## 2011-06-22 MED ORDER — MORPHINE SULFATE 2 MG/ML IJ SOLN
2.0000 mg | INTRAMUSCULAR | Status: DC | PRN
  Administered 2011-06-22 – 2011-06-24 (×10): 2 mg via INTRAVENOUS
  Filled 2011-06-22 (×10): qty 1

## 2011-06-22 MED ORDER — ONDANSETRON HCL 4 MG/2ML IJ SOLN
4.0000 mg | Freq: Once | INTRAMUSCULAR | Status: AC
  Administered 2011-06-22: 4 mg via INTRAVENOUS
  Filled 2011-06-22: qty 2

## 2011-06-22 MED ORDER — SODIUM CHLORIDE 0.9 % IV BOLUS (SEPSIS)
250.0000 mL | Freq: Once | INTRAVENOUS | Status: AC
  Administered 2011-06-22: 250 mL via INTRAVENOUS

## 2011-06-22 MED ORDER — ZOLPIDEM TARTRATE 5 MG PO TABS: 5.0000 mg | ORAL_TABLET | Freq: Every evening | ORAL | Status: DC | PRN

## 2011-06-22 MED ORDER — ENOXAPARIN SODIUM 40 MG/0.4ML ~~LOC~~ SOLN
40.0000 mg | SUBCUTANEOUS | Status: DC
  Administered 2011-06-22 – 2011-06-23 (×2): 40 mg via SUBCUTANEOUS
  Filled 2011-06-22 (×2): qty 0.4

## 2011-06-22 MED ORDER — NICOTINE 21 MG/24HR TD PT24
21.0000 mg | MEDICATED_PATCH | Freq: Every day | TRANSDERMAL | Status: DC
  Administered 2011-06-22 – 2011-06-23 (×2): 21 mg via TRANSDERMAL
  Filled 2011-06-22 (×3): qty 1

## 2011-06-22 MED ORDER — ONDANSETRON HCL 4 MG PO TABS: 4.0000 mg | ORAL_TABLET | Freq: Four times a day (QID) | ORAL | Status: DC | PRN

## 2011-06-22 MED ORDER — SENNOSIDES-DOCUSATE SODIUM 8.6-50 MG PO TABS: 1.0000 | ORAL_TABLET | Freq: Every day | ORAL | Status: DC | PRN

## 2011-06-22 MED ORDER — SODIUM CHLORIDE 0.9 % IV SOLN: INTRAVENOUS | Status: DC

## 2011-06-22 MED ORDER — INFLUENZA VIRUS VACC SPLIT PF IM SUSP
0.5000 mL | Freq: Once | INTRAMUSCULAR | Status: AC
  Administered 2011-06-23: 0.5 mL via INTRAMUSCULAR
  Filled 2011-06-22: qty 0.5

## 2011-06-22 MED ORDER — DEXTROSE 5 % IV SOLN
1.0000 g | INTRAVENOUS | Status: DC
  Administered 2011-06-22 – 2011-06-23 (×2): 1 g via INTRAVENOUS
  Filled 2011-06-22 (×2): qty 1

## 2011-06-22 MED ORDER — HYDROMORPHONE HCL 1 MG/ML IJ SOLN
1.0000 mg | Freq: Once | INTRAMUSCULAR | Status: AC
  Administered 2011-06-22: 1 mg via INTRAVENOUS

## 2011-06-22 MED ORDER — SODIUM CHLORIDE 0.9 % IJ SOLN
INTRAMUSCULAR | Status: AC
  Administered 2011-06-22: 10 mL
  Filled 2011-06-22: qty 10

## 2011-06-22 MED ORDER — SODIUM CHLORIDE 0.9 % IV SOLN
INTRAVENOUS | Status: DC
  Administered 2011-06-22 – 2011-06-23 (×2): via INTRAVENOUS

## 2011-06-22 MED ORDER — PNEUMOCOCCAL VAC POLYVALENT 25 MCG/0.5ML IJ INJ
0.5000 mL | INJECTION | INTRAMUSCULAR | Status: AC
  Administered 2011-06-23: 0.5 mL via INTRAMUSCULAR
  Filled 2011-06-22 (×2): qty 0.5

## 2011-06-22 NOTE — H&P (Signed)
Bonnie Jensen MRN: [id number] DOB/AGE: Feb 02, 1976 35 y.o.  Admit date: 06/22/2011 Chief Complaint: Back pain, fever, nausea and vomiting. HPI: This 35 year old lady gives a 2 week history of initially low back pain and then followed by fever associated with nausea and vomiting. She presented to the emergency room 4 days ago when it was felt that she had a UTI. She was prescribed oral ciprofloxacin. Unfortunately, she did not improve and she returned 2 days later whereupon she did have a CT scan of her abdomen. Reported as outlined below which suggests the possibility of pyelonephritis. She comes back to the emergency room today, having not improved. She also is complaining now of generalized abdominal pain more on the left associated with diarrhea which has been present for the last 3 days. She has been on antibiotics during this time. She has had poor by mouth intake.    Past medical history: 1. History of asthma. 2. Tobacco abuse.  Past surgical history: 1. Appendectomy. 2. Cholecystectomy. 3. Bilateral tubal ligation. 4. Tonsillectomy.      Family history: Noncontributory.  Social history: She is divorced. She smokes one pack of cigarettes per day. She does not drink alcohol. She is unemployed.  Allergies:  Allergies  Allergen Reactions  . Bee Venom Anaphylaxis  . Toradol Anaphylaxis and Other (See Comments)    Seizure type spasms  . Imitrex (Sumatriptan Base) Other (See Comments)    Chest pain  . Midrin Other (See Comments)    Reaction : chest pain    Medications Prior to Admission  Medication Dose Route Frequency Provider Last Rate Last Dose  . 0.9 %  sodium chloride infusion   Intravenous Continuous [name], MD 100 mL/hr at 06/22/11 1512 750 mL at 06/22/11 1512  . ciprofloxacin (CIPRO) IVPB 400 mg  400 mg Intravenous Once [name], MD 200 mL/hr at 06/22/11 1728 400 mg at 06/22/11 1728  . HYDROmorphone (DILAUDID) injection 1 mg  1 mg Intravenous  Once [name], MD   1 mg at 06/22/11 1453  . HYDROmorphone (DILAUDID) injection 1 mg  1 mg Intravenous Once [name], MD   1 mg at 06/22/11 1620  . HYDROmorphone (DILAUDID) injection 1 mg  1 mg Intravenous Once [name], MD      . ondansetron [facility]) injection 4 mg  4 mg Intravenous Once [name], MD   4 mg at 06/22/11 1453  . ondansetron (ZOFRAN) injection 4 mg  4 mg Intravenous Once [name], MD      . sodium chloride 0.9 % bolus 250 mL  250 mL Intravenous Once [name], MD   250 mL at 06/22/11 1452   Medications Prior to Admission  Medication Sig Dispense Refill  . acetaminophen (TYLENOL) 325 MG tablet Take 650 mg by mouth every 6 (six) hours as needed. For fever      . ciprofloxacin (CIPRO) 500 MG tablet Take 1 tablet (500 mg total) by mouth 2 (two) times daily.  14 tablet  0  . HYDROcodone-acetaminophen (NORCO) 5-325 MG per tablet Take 1 tablet by mouth every 4 (four) hours as needed for pain.  15 tablet  0  . ibuprofen (ADVIL) 200 MG tablet Take 400 mg by mouth every 6 (six) hours as needed. For pain      . promethazine (PHENERGAN) 25 MG tablet Take 1 tablet (25 mg total) by mouth every 6 (six) hours as needed for nausea.  20 tablet  0  .  metoCLOPramide (REGLAN) 10 MG tablet Take 1 tablet (10 mg total) by mouth every 6 (six) hours.  30 tablet  0  . oxyCODONE-acetaminophen (PERCOCET) 5-325 MG per tablet Take 2 tablets by mouth every 4 (four) hours as needed for pain.  15 tablet  0       RUE:AVWUJ from the symptoms mentioned above,there are no other symptoms referable to all systems reviewed.  Physical Exam: Blood pressure 131/71, pulse 69, temperature 99 F (37.2 C), resp. rate 18, height 5' (1.524 m), weight 68.04 kg (150 lb), last menstrual period 05/30/2011, SpO2 95.00%. She looks unwell. She appears to be uncomfortable. She has a low-grade fever. She is not critically toxic or septic currently. Cardiovascular heart sounds  are present and normal without murmurs. There is a resting tachycardia. Lung fields are clear with occasional wheezing which is probably chronic related to her tobacco abuse. Her abdomen is soft but his tenderness generalized fashion more in the left than the right. Bowel sounds are heard. There are no masses felt. She does also appear to have loin tenderness more in the left than the right. She is alert and orientated without any focal neurological signs.   Basename 06/22/11 1447 06/20/11 0905  WBC 16.3* 15.9*  NEUTROABS -- 11.9*  HGB 10.3* 10.4*  HCT 30.3* 30.3*  MCV 95.6 94.7  PLT 537* 415*    Basename 06/22/11 1447 06/20/11 0905  NA 139 142  K 3.1* 2.3*  CL 98 100  CO2 33* 31  GLUCOSE 90 99  BUN 8 6  CREATININE 0.61 0.80  CALCIUM 9.1 8.4  MG -- --  PHOS -- --   No results found for this or any previous visit (from the past 240 hour(s)).  Dg Chest 2 View  06/18/2011  *RADIOLOGY REPORT*  Clinical Data:  Congestion, fever, nausea, vomiting  CHEST - 2 VIEW  Comparison: 07/27/2010  Findings: Normal heart size, mediastinal contours, and pulmonary vascularity. Mild peribronchial thickening. No pulmonary infiltrate, pleural effusion, or pneumothorax. Bones unremarkable.  IMPRESSION: Mild chronic bronchitic changes.  Original Report Authenticated By: Lollie Marrow, M.D.   Ct Abdomen Pelvis W Contrast  06/20/2011  *RADIOLOGY REPORT*  Clinical Data: Left-sided abdominal pain, T T P, question diverticulitis  CT ABDOMEN AND PELVIS WITH CONTRAST  Technique:  Multidetector CT imaging of the abdomen and pelvis was performed following the standard protocol during bolus administration of intravenous contrast. Sagittal and coronal MPR images reconstructed from axial data set.  Contrast: OMNIPAQUE IOHEXOL 300 MG/ML IV SOLN; Dilute oral contrast.  Comparison: Noncontrast CT abdomen pelvis 05/15/2011  Findings: Minimal dependent atelectasis left lung base. Liver, spleen, pancreas, and adrenal glands  normal appearance. Gallbladder and appendix surgically absent. 13 x 11 mm and 11 x 9 mm low to intermediate attenuation foci and left kidney, indeterminate, not definitely seen on the previous exam though previous assessment was limited by noncontrast technique. At several sites within both kidneys left greater than right, the cortical nephrograms are slightly indistinct, raising question of pyelonephritis. Minimal fluid or thickening at Gerota's fascia at inferior to right kidney.  Retroaortic left renal vein. Small amount nonspecific low attenuation free pelvic fluid. Small calcified nodule within right ovary. Uterus, adnexae, and bladder otherwise normal. Stomach and bowel loops normal appearance. No mass, adenopathy, or hernia. No acute osseous findings.  IMPRESSION: Areas of indistinct cortical nephrograms and kidneys, primarily on the left, cannot exclude pyelonephritis; correlation with urinalysis recommended. Questionable low to intermediate attenuation foci within the left kidney, nonspecific,  not definitely seen on prior noncontrast exam. These are indeterminate in etiology; consider follow-up sonographic or MR imaging in 1-2 months, following resolution of the patient's current acute symptoms, to reassess.  Original Report Authenticated By: Lollie Marrow, M.D.   Dg Abd Acute W/chest  06/22/2011  *RADIOLOGY REPORT*  Clinical Data: Abdominal pain  ACUTE ABDOMEN SERIES (ABDOMEN 2 VIEW & CHEST 1 VIEW)  Comparison: Chest radiograph 06/18/2011  Findings: Upper-normal size of cardiac silhouette. Mediastinal contours and pulmonary vascularity normal. Minimal chronic peribronchial thickening. Lungs otherwise clear. No pleural effusion or pneumothorax. Surgical clips right upper quadrant question cholecystectomy. Gas throughout nondistended large and small bowel loops. No evidence of bowel obstruction, bowel wall thickening, or free intraperitoneal air. Small pelvic phleboliths. No definite urinary tract  calcification or acute osseous finding.  IMPRESSION: Minimal chronic bronchitic changes. No acute abdominal findings.  Original Report Authenticated By: Lollie Marrow, M.D.   Impression: 1. Pyelonephritis, probably bilateral. 2. Tobacco abuse. 3. History of asthma. 4. Hypokalemia.     Plan: 1. Admit. 2. Intravenous antibiotics. 3. Intravenous fluids. 4. Replete potassium. Further recommendations will depend on patient's hospital progress.     Rhilyn Battle C 06/22/2011, 5:43 PM

## 2011-06-22 NOTE — ED Notes (Signed)
Pt requests more medication for pain and nausea; Dr. Deretha Emory notified.

## 2011-06-22 NOTE — ED Notes (Signed)
Pt here for same as seen on Monday. Pt still complaints of left side flank and lower back pain. Pt states unable to take zithromax and reglan. Pt states she has not taken percocet.

## 2011-06-22 NOTE — ED Notes (Signed)
Dr. Deretha Emory notified of pt c/o unchanged lower back and left-sided abd pain; orders rec'd for repeat Dilaudid 1mg  IV.

## 2011-06-22 NOTE — ED Notes (Signed)
C/o nausea/vomiting, lower back pain and left-sided abd pain onset 15 days ago; c/o diarrhea x 2 days; describes pain as stabbing pressure; was seen here 4 days ago and 2 days ago for same; initially dx with UTI; reports last vomited 2 hours ago and had episode of diarrhea at that time; states that Rx med given for nausea (Relan) is not helping.

## 2011-06-22 NOTE — ED Provider Notes (Signed)
History     CSN: [id number] Arrival date & time: 06/22/2011  2:05 PM   Chief Complaint  Patient presents with  . Back Pain  . Flank Pain     (Include location/radiation/quality/duration/timing/severity/associated sxs/prior treatment) The history is provided by the patient.  PERSISTENT NAUSEA VOMITING DIARRHEA FOR 6 DAYS NOW. SEEN IN ED Saturday AND Monday 2 DAYS AGO RX WITH FLUID LABS AND CT ABD/PELVIS AND NOT IMPROVING. NOT ABLE TO KEEP ANY FOOD OR MEDS DOWN. ASSOCIATED WITH SOME ABD PAIN MOSTLY LUQ AND MILD HEADACHE, AND SOME BODY ACHES. NO BLOOD IN BM OR EMESIS.    Past Medical History  Diagnosis Date  . Asthma      Past Surgical History  Procedure Date  . Appendectomy   . Cholecystectomy   . Tonsillectomy   . Brain surgery   . Tubal ligation     No family history on file.  History  Substance Use Topics  . Smoking status: Current Everyday Smoker  . Smokeless tobacco: Not on file  . Alcohol Use: No    OB History    Grav Para Term Preterm Abortions TAB SAB Ect Mult Living                  Review of Systems  Constitutional: Negative for fever and chills.  HENT: Negative for congestion.   Eyes: Negative for redness.  Respiratory: Negative for cough and shortness of breath.   Cardiovascular: Negative for chest pain.  Gastrointestinal: Positive for nausea, vomiting, abdominal pain and diarrhea.  Genitourinary: Negative for dysuria, vaginal bleeding, vaginal discharge and pelvic pain.  Musculoskeletal: Positive for myalgias.  Skin: Negative for rash.  Neurological: Positive for headaches.  Psychiatric/Behavioral: Negative for confusion.    Allergies  Bee venom; Toradol; Imitrex; and Midrin  Home Medications   Current Outpatient Rx  Name Route Sig Dispense Refill  . ACETAMINOPHEN 325 MG PO TABS Oral Take 650 mg by mouth every 6 (six) hours as needed. For fever    . CIPROFLOXACIN HCL 500 MG PO TABS Oral Take 1 tablet (500 mg total) by mouth 2 (two) times  daily. 14 tablet 0  . HYDROCODONE-ACETAMINOPHEN 5-325 MG PO TABS Oral Take 1 tablet by mouth every 4 (four) hours as needed for pain. 15 tablet 0  . IBUPROFEN 200 MG PO TABS Oral Take 400 mg by mouth every 6 (six) hours as needed. For pain     . METOCLOPRAMIDE HCL 10 MG PO TABS Oral Take 1 tablet (10 mg total) by mouth every 6 (six) hours. 30 tablet 0  . OXYCODONE-ACETAMINOPHEN 5-325 MG PO TABS Oral Take 2 tablets by mouth every 4 (four) hours as needed for pain. 15 tablet 0  . PROMETHAZINE HCL 25 MG PO TABS Oral Take 1 tablet (25 mg total) by mouth every 6 (six) hours as needed for nausea. 20 tablet 0    Physical Exam    BP 149/87  Pulse 72  Temp(Src) 100.3 F (37.9 C) (Oral)  Resp 18  Ht 5' (1.524 m)  Wt 150 lb (68.04 kg)  BMI 29.30 kg/m2  SpO2 95%  LMP 05/30/2011  Physical Exam  Nursing note and vitals reviewed. Constitutional: She is oriented to person, place, and time. She appears well-developed and well-nourished. No distress.  HENT:  Head: Normocephalic and atraumatic.  Eyes: EOM are normal. Pupils are equal, round, and reactive to light. No scleral icterus.  Neck: Normal range of motion. Neck supple.  Cardiovascular: Normal rate, regular rhythm and normal  heart sounds.   No murmur heard. Pulmonary/Chest: Effort normal and breath sounds normal.  Abdominal: Soft. Bowel sounds are normal. There is tenderness. There is no guarding.  Musculoskeletal: Normal range of motion. She exhibits no edema.  Neurological: She is alert and oriented to person, place, and time. She displays normal reflexes. No cranial nerve deficit. She exhibits normal muscle tone. Coordination normal.  Skin: Skin is warm. No rash noted.    ED Course  Procedures  Results for orders placed during the hospital encounter of 06/20/11  CBC      Component Value Range   WBC 15.9 (*) 4.0 - 10.5 (K/uL)   RBC 3.20 (*) 3.87 - 5.11 (MIL/uL)   Hemoglobin 10.4 (*) 12.0 - 15.0 (g/dL)   HCT 16.1 (*) 09.6 - 46.0  (%)   MCV 94.7  78.0 - 100.0 (fL)   MCH 32.5  26.0 - 34.0 (pg)   MCHC 34.3  30.0 - 36.0 (g/dL)   RDW 04.5  40.9 - 81.1 (%)   Platelets 415 (*) 150 - 400 (K/uL)  DIFFERENTIAL      Component Value Range   Neutrophils Relative 75  43 - 77 (%)   Neutro Abs 11.9 (*) 1.7 - 7.7 (K/uL)   Lymphocytes Relative 16  12 - 46 (%)   Lymphs Abs 2.5  0.7 - 4.0 (K/uL)   Monocytes Relative 8  3 - 12 (%)   Monocytes Absolute 1.3 (*) 0.1 - 1.0 (K/uL)   Eosinophils Relative 1  0 - 5 (%)   Eosinophils Absolute 0.1  0.0 - 0.7 (K/uL)   Basophils Relative 0  0 - 1 (%)   Basophils Absolute 0.0  0.0 - 0.1 (K/uL)   WBC Morphology ATYPICAL LYMPHOCYTES    BASIC METABOLIC PANEL      Component Value Range   Sodium 142  135 - 145 (mEq/L)   Potassium 2.3 (*) 3.5 - 5.1 (mEq/L)   Chloride 100  96 - 112 (mEq/L)   CO2 31  19 - 32 (mEq/L)   Glucose, Bld 99  70 - 99 (mg/dL)   BUN 6  6 - 23 (mg/dL)   Creatinine, Ser 9.14  0.50 - 1.10 (mg/dL)   Calcium 8.4  8.4 - 78.2 (mg/dL)   GFR calc non Af Amer >60  >60 (mL/min)   GFR calc Af Amer >60  >60 (mL/min)  URINALYSIS, ROUTINE W REFLEX MICROSCOPIC      Component Value Range   Color, Urine YELLOW  YELLOW    Appearance CLEAR  CLEAR    Specific Gravity, Urine 1.005  1.005 - 1.030    pH 6.5  5.0 - 8.0    Glucose, UA NEGATIVE  NEGATIVE (mg/dL)   Hgb urine dipstick TRACE (*) NEGATIVE    Bilirubin Urine NEGATIVE  NEGATIVE    Ketones, ur NEGATIVE  NEGATIVE (mg/dL)   Protein, ur NEGATIVE  NEGATIVE (mg/dL)   Urobilinogen, UA 0.2  0.0 - 1.0 (mg/dL)   Nitrite NEGATIVE  NEGATIVE    Leukocytes, UA NEGATIVE  NEGATIVE   PREGNANCY, URINE      Component Value Range   Preg Test, Ur NEGATIVE    URINE MICROSCOPIC-ADD ON      Component Value Range   Squamous Epithelial / LPF FEW (*) RARE    RBC / HPF 3-6  <3 (RBC/hpf)   Dg Chest 2 View  06/18/2011  *RADIOLOGY REPORT*  Clinical Data:  Congestion, fever, nausea, vomiting  CHEST - 2 VIEW  Comparison: 07/27/2010  Findings: Normal  heart size, mediastinal contours, and pulmonary vascularity. Mild peribronchial thickening. No pulmonary infiltrate, pleural effusion, or pneumothorax. Bones unremarkable.  IMPRESSION: Mild chronic bronchitic changes.  Original Report Authenticated By: Lollie Marrow, M.D.   Ct Abdomen Pelvis W Contrast  06/20/2011  *RADIOLOGY REPORT*  Clinical Data: Left-sided abdominal pain, T T P, question diverticulitis  CT ABDOMEN AND PELVIS WITH CONTRAST  Technique:  Multidetector CT imaging of the abdomen and pelvis was performed following the standard protocol during bolus administration of intravenous contrast. Sagittal and coronal MPR images reconstructed from axial data set.  Contrast: OMNIPAQUE IOHEXOL 300 MG/ML IV SOLN; Dilute oral contrast.  Comparison: Noncontrast CT abdomen pelvis 05/15/2011  Findings: Minimal dependent atelectasis left lung base. Liver, spleen, pancreas, and adrenal glands normal appearance. Gallbladder and appendix surgically absent. 13 x 11 mm and 11 x 9 mm low to intermediate attenuation foci and left kidney, indeterminate, not definitely seen on the previous exam though previous assessment was limited by noncontrast technique. At several sites within both kidneys left greater than right, the cortical nephrograms are slightly indistinct, raising question of pyelonephritis. Minimal fluid or thickening at Gerota's fascia at inferior to right kidney.  Retroaortic left renal vein. Small amount nonspecific low attenuation free pelvic fluid. Small calcified nodule within right ovary. Uterus, adnexae, and bladder otherwise normal. Stomach and bowel loops normal appearance. No mass, adenopathy, or hernia. No acute osseous findings.  IMPRESSION: Areas of indistinct cortical nephrograms and kidneys, primarily on the left, cannot exclude pyelonephritis; correlation with urinalysis recommended. Questionable low to intermediate attenuation foci within the left kidney, nonspecific, not definitely seen  on prior noncontrast exam. These are indeterminate in etiology; consider follow-up sonographic or MR imaging in 1-2 months, following resolution of the patient's current acute symptoms, to reassess.  Original Report Authenticated By: Lollie Marrow, M.D.     MDM ETIOLOGY MAY BE VIRAL, CT ON 9/17 WITHOUT SPECIFIC FINDINGS. PERSISTENT LEUKOCYTOSIS. UA NEGATIVE NOT ABLE TO KEEP MEDS DOWN. BM AND EMESIS WITHOUT BLOOD. CLINICALLY DRY BUT NOT ON LABS. NO ACUTE SURGICAL ABD, NO PELVIC PAIN OR DISCHARGE.   IMP: PERSISTENT NAUSEA VOMITING.  HOSPITALIST TO ADMIT.    [name], MD 06/22/11 [id number]

## 2011-06-23 LAB — RETICULOCYTES: Retic Count, Absolute: 27.2 10*3/uL (ref 19.0–186.0)

## 2011-06-23 LAB — COMPREHENSIVE METABOLIC PANEL
ALT: 13 U/L (ref 0–35)
Albumin: 2.4 g/dL — ABNORMAL LOW (ref 3.5–5.2)
Alkaline Phosphatase: 137 U/L — ABNORMAL HIGH (ref 39–117)
BUN: 5 mg/dL — ABNORMAL LOW (ref 6–23)
Potassium: 3.2 mEq/L — ABNORMAL LOW (ref 3.5–5.1)
Sodium: 143 mEq/L (ref 135–145)
Total Protein: 6.2 g/dL (ref 6.0–8.3)

## 2011-06-23 LAB — CBC
HCT: 26.3 % — ABNORMAL LOW (ref 36.0–46.0)
MCV: 96.3 fL (ref 78.0–100.0)
RDW: 13.5 % (ref 11.5–15.5)
WBC: 12.3 10*3/uL — ABNORMAL HIGH (ref 4.0–10.5)

## 2011-06-23 MED ORDER — POTASSIUM CHLORIDE IN NACL 40-0.9 MEQ/L-% IV SOLN
INTRAVENOUS | Status: DC
  Administered 2011-06-23: 20:00:00 via INTRAVENOUS
  Administered 2011-06-23 – 2011-06-24 (×2): 100 mL/h via INTRAVENOUS
  Filled 2011-06-23 (×4): qty 1000

## 2011-06-23 MED ORDER — METOCLOPRAMIDE HCL 5 MG/ML IJ SOLN
5.0000 mg | Freq: Three times a day (TID) | INTRAMUSCULAR | Status: DC
  Administered 2011-06-23 – 2011-06-24 (×3): 5 mg via INTRAVENOUS
  Filled 2011-06-23 (×3): qty 2

## 2011-06-23 NOTE — Progress Notes (Signed)
Subjective: This lady continues to still have pain and nausea although she is able to keep a clear fluid diet diet. She has not been febrile.           Physical Exam: Blood pressure 124/73, pulse 70, temperature 98.7 F (37.1 C), temperature source Oral, resp. rate 20, height 4\' 11"  (1.499 m), weight 72 kg (158 lb 11.7 oz), last menstrual period 05/30/2011, SpO2 93.00%. She does look systemically well and is not septic or toxic. Her abdomen continues to be slightly tender in the generalized fashion.   Investigations:  Basename 06/23/11 0530 06/22/11 1447  WBC 12.3* 16.3*  NEUTROABS -- --  HGB 8.9* 10.3*  HCT 26.3* 30.3*  MCV 96.3 95.6  PLT 398 537*    Basename 06/23/11 0530 06/22/11 1447  NA 143 139  K 3.2* 3.1*  CL 105 98  CO2 32 33*  GLUCOSE 97 90  BUN 5* 8  CREATININE 0.58 0.61  CALCIUM 8.5 9.1  MG -- --  PHOS -- --   No results found for this or any previous visit (from the past 240 hour(s)).  Dg Abd Acute W/chest  06/22/2011  *RADIOLOGY REPORT*  Clinical Data: Abdominal pain  ACUTE ABDOMEN SERIES (ABDOMEN 2 VIEW & CHEST 1 VIEW)  Comparison: Chest radiograph 06/18/2011  Findings: Upper-normal size of cardiac silhouette. Mediastinal contours and pulmonary vascularity normal. Minimal chronic peribronchial thickening. Lungs otherwise clear. No pleural effusion or pneumothorax. Surgical clips right upper quadrant question cholecystectomy. Gas throughout nondistended large and small bowel loops. No evidence of bowel obstruction, bowel wall thickening, or free intraperitoneal air. Small pelvic phleboliths. No definite urinary tract calcification or acute osseous finding.  IMPRESSION: Minimal chronic bronchitic changes. No acute abdominal findings.  Original Report Authenticated By: Lollie Marrow, M.D.      Medications: I have reviewed the patient's current medications.  Impression: 1. Pyelonephritis. 2. Tobacco abuse.     Plan: 1. Continue intravenous fluids  and intravenous antibiotics. 2. Start scheduled metoclopramide 5 mg IV every 8 hours.     LOS: 1 day   Rojelio Uhrich C 06/23/2011, 10:59 AM

## 2011-06-23 NOTE — Progress Notes (Signed)
UR Chart Review Completed  

## 2011-06-24 LAB — CBC
HCT: 26.4 % — ABNORMAL LOW (ref 36.0–46.0)
MCH: 32.1 pg (ref 26.0–34.0)
MCHC: 33.3 g/dL (ref 30.0–36.0)
MCV: 96.4 fL (ref 78.0–100.0)
RDW: 13.4 % (ref 11.5–15.5)

## 2011-06-24 LAB — COMPREHENSIVE METABOLIC PANEL
Albumin: 2.4 g/dL — ABNORMAL LOW (ref 3.5–5.2)
BUN: 7 mg/dL (ref 6–23)
Calcium: 8.6 mg/dL (ref 8.4–10.5)
Creatinine, Ser: 0.7 mg/dL (ref 0.50–1.10)
Potassium: 4 mEq/L (ref 3.5–5.1)
Total Protein: 6.3 g/dL (ref 6.0–8.3)

## 2011-06-24 LAB — IRON AND TIBC
Iron: 28 ug/dL — ABNORMAL LOW (ref 42–135)
UIBC: 193 ug/dL (ref 125–400)

## 2011-06-24 LAB — URINE CULTURE
Colony Count: NO GROWTH
Culture: NO GROWTH

## 2011-06-24 LAB — FERRITIN: Ferritin: 270 ng/mL (ref 10–291)

## 2011-06-24 MED ORDER — HYDROCODONE-ACETAMINOPHEN 5-325 MG PO TABS: 1.0000 | ORAL_TABLET | ORAL | Status: AC | PRN

## 2011-06-24 NOTE — Discharge Summary (Addendum)
Physician Discharge Summary  Patient ID: Bonnie Jensen MRN: [id number] DOB/AGE: 10-18-1975 35 y.o.  Admit date: 06/22/2011 Discharge date: 06/24/2011    Discharge Diagnoses:  1. Pyelonephritis, improved. 2. Tobacco abuse.   Current Discharge Medication List    CONTINUE these medications which have CHANGED   Details  HYDROcodone-acetaminophen (NORCO) 5-325 MG per tablet Take 1 tablet by mouth every 4 (four) hours as needed for pain. Qty: 15 tablet, Refills: 0      CONTINUE these medications which have NOT CHANGED   Details  acetaminophen (TYLENOL) 325 MG tablet Take 650 mg by mouth every 6 (six) hours as needed. For fever    ciprofloxacin (CIPRO) 500 MG tablet Take 1 tablet (500 mg total) by mouth 2 (two) times daily. Qty: 14 tablet, Refills: 0    ibuprofen (ADVIL) 200 MG tablet Take 400 mg by mouth every 6 (six) hours as needed. For pain    promethazine (PHENERGAN) 25 MG tablet Take 1 tablet (25 mg total) by mouth every 6 (six) hours as needed for nausea. Qty: 20 tablet, Refills: 0    metoCLOPramide (REGLAN) 10 MG tablet Take 1 tablet (10 mg total) by mouth every 6 (six) hours. Qty: 30 tablet, Refills: 0      STOP taking these medications     oxyCODONE-acetaminophen (PERCOCET) 5-325 MG per tablet         Discharged Condition: Stable and improved.    Consults: None.  Significant Diagnostic Studies: Dg Chest 2 View  06/18/2011  *RADIOLOGY REPORT*  Clinical Data:  Congestion, fever, nausea, vomiting  CHEST - 2 VIEW  Comparison: 07/27/2010  Findings: Normal heart size, mediastinal contours, and pulmonary vascularity. Mild peribronchial thickening. No pulmonary infiltrate, pleural effusion, or pneumothorax. Bones unremarkable.  IMPRESSION: Mild chronic bronchitic changes.  Original Report Authenticated By: Lollie Marrow, M.D.   Ct Abdomen Pelvis W Contrast  06/20/2011  *RADIOLOGY REPORT*  Clinical Data: Left-sided abdominal pain, T T P, question diverticulitis  CT  ABDOMEN AND PELVIS WITH CONTRAST  Technique:  Multidetector CT imaging of the abdomen and pelvis was performed following the standard protocol during bolus administration of intravenous contrast. Sagittal and coronal MPR images reconstructed from axial data set.  Contrast: OMNIPAQUE IOHEXOL 300 MG/ML IV SOLN; Dilute oral contrast.  Comparison: Noncontrast CT abdomen pelvis 05/15/2011  Findings: Minimal dependent atelectasis left lung base. Liver, spleen, pancreas, and adrenal glands normal appearance. Gallbladder and appendix surgically absent. 13 x 11 mm and 11 x 9 mm low to intermediate attenuation foci and left kidney, indeterminate, not definitely seen on the previous exam though previous assessment was limited by noncontrast technique. At several sites within both kidneys left greater than right, the cortical nephrograms are slightly indistinct, raising question of pyelonephritis. Minimal fluid or thickening at Gerota's fascia at inferior to right kidney.  Retroaortic left renal vein. Small amount nonspecific low attenuation free pelvic fluid. Small calcified nodule within right ovary. Uterus, adnexae, and bladder otherwise normal. Stomach and bowel loops normal appearance. No mass, adenopathy, or hernia. No acute osseous findings.  IMPRESSION: Areas of indistinct cortical nephrograms and kidneys, primarily on the left, cannot exclude pyelonephritis; correlation with urinalysis recommended. Questionable low to intermediate attenuation foci within the left kidney, nonspecific, not definitely seen on prior noncontrast exam. These are indeterminate in etiology; consider follow-up sonographic or MR imaging in 1-2 months, following resolution of the patient's current acute symptoms, to reassess.  Original Report Authenticated By: Lollie Marrow, M.D.   Dg Abd Acute W/chest  06/22/2011  *RADIOLOGY REPORT*  Clinical Data: Abdominal pain  ACUTE ABDOMEN SERIES (ABDOMEN 2 VIEW & CHEST 1 VIEW)  Comparison: Chest  radiograph 06/18/2011  Findings: Upper-normal size of cardiac silhouette. Mediastinal contours and pulmonary vascularity normal. Minimal chronic peribronchial thickening. Lungs otherwise clear. No pleural effusion or pneumothorax. Surgical clips right upper quadrant question cholecystectomy. Gas throughout nondistended large and small bowel loops. No evidence of bowel obstruction, bowel wall thickening, or free intraperitoneal air. Small pelvic phleboliths. No definite urinary tract calcification or acute osseous finding.  IMPRESSION: Minimal chronic bronchitic changes. No acute abdominal findings.  Original Report Authenticated By: Lollie Marrow, M.D.    Lab Results: Results for orders placed during the hospital encounter of 06/22/11 (from the past 48 hour(s))  URINALYSIS, ROUTINE W REFLEX MICROSCOPIC     Status: Normal   Collection Time   06/22/11  2:35 PM      Component Value Range Comment   Color, Urine YELLOW  YELLOW     Appearance CLEAR  CLEAR     Specific Gravity, Urine 1.015  1.005 - 1.030     pH 7.5  5.0 - 8.0     Glucose, UA NEGATIVE  NEGATIVE (mg/dL)    Hgb urine dipstick NEGATIVE  NEGATIVE     Bilirubin Urine NEGATIVE  NEGATIVE     Ketones, ur NEGATIVE  NEGATIVE (mg/dL)    Protein, ur NEGATIVE  NEGATIVE (mg/dL)    Urobilinogen, UA 0.2  0.0 - 1.0 (mg/dL)    Nitrite NEGATIVE  NEGATIVE     Leukocytes, UA NEGATIVE  NEGATIVE  MICROSCOPIC NOT DONE ON URINES WITH NEGATIVE PROTEIN, BLOOD, LEUKOCYTES, NITRITE, OR GLUCOSE <1000 mg/dL.  COMPREHENSIVE METABOLIC PANEL     Status: Abnormal   Collection Time   06/22/11  2:47 PM      Component Value Range Comment   Sodium 139  135 - 145 (mEq/L)    Potassium 3.1 (*) 3.5 - 5.1 (mEq/L)    Chloride 98  96 - 112 (mEq/L)    CO2 33 (*) 19 - 32 (mEq/L)    Glucose, Bld 90  70 - 99 (mg/dL)    BUN 8  6 - 23 (mg/dL)    Creatinine, Ser 1.61  0.50 - 1.10 (mg/dL)    Calcium 9.1  8.4 - 10.5 (mg/dL)    Total Protein 7.2  6.0 - 8.3 (g/dL)    Albumin 2.8  (*) 3.5 - 5.2 (g/dL)    AST 13  0 - 37 (U/L)    ALT 19  0 - 35 (U/L)    Alkaline Phosphatase 165 (*) 39 - 117 (U/L)    Total Bilirubin 0.3  0.3 - 1.2 (mg/dL)    GFR calc non Af Amer >60  >60 (mL/min)    GFR calc Af Amer >60  >60 (mL/min)   CBC     Status: Abnormal   Collection Time   06/22/11  2:47 PM      Component Value Range Comment   WBC 16.3 (*) 4.0 - 10.5 (K/uL)    RBC 3.17 (*) 3.87 - 5.11 (MIL/uL)    Hemoglobin 10.3 (*) 12.0 - 15.0 (g/dL)    HCT 09.6 (*) 04.5 - 46.0 (%)    MCV 95.6  78.0 - 100.0 (fL)    MCH 32.5  26.0 - 34.0 (pg)    MCHC 34.0  30.0 - 36.0 (g/dL)    RDW 40.9  81.1 - 91.4 (%)    Platelets 537 (*) 150 - 400 (  K/uL)   LIPASE, BLOOD     Status: Normal   Collection Time   06/22/11  2:47 PM      Component Value Range Comment   Lipase 19  11 - 59 (U/L)   COMPREHENSIVE METABOLIC PANEL     Status: Abnormal   Collection Time   06/23/11  5:30 AM      Component Value Range Comment   Sodium 143  135 - 145 (mEq/L)    Potassium 3.2 (*) 3.5 - 5.1 (mEq/L)    Chloride 105  96 - 112 (mEq/L)    CO2 32  19 - 32 (mEq/L)    Glucose, Bld 97  70 - 99 (mg/dL)    BUN 5 (*) 6 - 23 (mg/dL)    Creatinine, Ser 4.09  0.50 - 1.10 (mg/dL)    Calcium 8.5  8.4 - 10.5 (mg/dL)    Total Protein 6.2  6.0 - 8.3 (g/dL)    Albumin 2.4 (*) 3.5 - 5.2 (g/dL)    AST 13  0 - 37 (U/L)    ALT 13  0 - 35 (U/L)    Alkaline Phosphatase 137 (*) 39 - 117 (U/L)    Total Bilirubin 0.2 (*) 0.3 - 1.2 (mg/dL)    GFR calc non Af Amer >60  >60 (mL/min)    GFR calc Af Amer >60  >60 (mL/min)   CBC     Status: Abnormal   Collection Time   06/23/11  5:30 AM      Component Value Range Comment   WBC 12.3 (*) 4.0 - 10.5 (K/uL)    RBC 2.73 (*) 3.87 - 5.11 (MIL/uL)    Hemoglobin 8.9 (*) 12.0 - 15.0 (g/dL)    HCT 81.1 (*) 91.4 - 46.0 (%)    MCV 96.3  78.0 - 100.0 (fL)    MCH 32.6  26.0 - 34.0 (pg)    MCHC 33.8  30.0 - 36.0 (g/dL)    RDW 78.2  95.6 - 21.3 (%)    Platelets 398  150 - 400 (K/uL) DELTA CHECK NOTED    VITAMIN B12     Status: Abnormal   Collection Time   06/23/11  5:30 AM      Component Value Range Comment   Vitamin B-12 1096 (*) 211 - 911 (pg/mL)   FOLATE     Status: Normal   Collection Time   06/23/11  5:30 AM      Component Value Range Comment   Folate 9.0     IRON AND TIBC     Status: Abnormal   Collection Time   06/23/11  5:30 AM      Component Value Range Comment   Iron 28 (*) 42 - 135 (ug/dL)    TIBC 086 (*) 578 - 470 (ug/dL)    Saturation Ratios 13 (*) 20 - 55 (%)    UIBC 193  125 - 400 (ug/dL)   FERRITIN     Status: Normal   Collection Time   06/23/11  5:30 AM      Component Value Range Comment   Ferritin 270  10 - 291 (ng/mL)   RETICULOCYTES     Status: Abnormal   Collection Time   06/23/11  5:30 AM      Component Value Range Comment   Retic Ct Pct 1.0  0.4 - 3.1 (%)    RBC. 2.72 (*) 3.87 - 5.11 (MIL/uL)    Retic Count, Manual 27.2  19.0 - 186.0 (K/uL)  CBC     Status: Abnormal   Collection Time   06/24/11  5:25 AM      Component Value Range Comment   WBC 11.2 (*) 4.0 - 10.5 (K/uL)    RBC 2.74 (*) 3.87 - 5.11 (MIL/uL)    Hemoglobin 8.8 (*) 12.0 - 15.0 (g/dL)    HCT 16.1 (*) 09.6 - 46.0 (%)    MCV 96.4  78.0 - 100.0 (fL)    MCH 32.1  26.0 - 34.0 (pg)    MCHC 33.3  30.0 - 36.0 (g/dL)    RDW 04.5  40.9 - 81.1 (%)    Platelets 418 (*) 150 - 400 (K/uL)   COMPREHENSIVE METABOLIC PANEL     Status: Abnormal   Collection Time   06/24/11  5:25 AM      Component Value Range Comment   Sodium 141  135 - 145 (mEq/L)    Potassium 4.0  3.5 - 5.1 (mEq/L) DELTA CHECK NOTED   Chloride 106  96 - 112 (mEq/L)    CO2 29  19 - 32 (mEq/L)    Glucose, Bld 95  70 - 99 (mg/dL)    BUN 7  6 - 23 (mg/dL)    Creatinine, Ser 9.14  0.50 - 1.10 (mg/dL)    Calcium 8.6  8.4 - 10.5 (mg/dL)    Total Protein 6.3  6.0 - 8.3 (g/dL)    Albumin 2.4 (*) 3.5 - 5.2 (g/dL)    AST 12  0 - 37 (U/L)    ALT 14  0 - 35 (U/L)    Alkaline Phosphatase 138 (*) 39 - 117 (U/L)    Total Bilirubin 0.2 (*) 0.3  - 1.2 (mg/dL)    GFR calc non Af Amer >60  >60 (mL/min)    GFR calc Af Amer >60  >60 (mL/min)       Hospital Course: This 35 year old lady was admitted with abdominal pain, nausea and vomiting and increased frequency of micturition. She had been seen several times in the emergency room and was sent home on one occasion with oral ciprofloxacin. However, she failed to improve as an outpatient. Therefore she was admitted and was put on intravenous ceftriaxone. She was slow to improve but today she feels extremely well and wants to go home and she is pain-free or very minimally in pain, has had no nausea vomiting. Urine was sent for culture but I suspect it will be negative as she has already been on antibiotics prior to this. CT scan of the abdomen had been done previously and the results are suggestive of pyelonephritis.  Discharge Exam: Blood pressure 155/88, pulse 62, temperature 98.8 F (37.1 C), temperature source Oral, resp. rate 18, height 4\' 11"  (1.499 m), weight 72 kg (158 lb 11.7 oz), last menstrual period 05/30/2011, SpO2 99.00%. She looks systemically well. Heart sounds are present and normal. Lung fields are clear. Her abdomen is soft and nontender today. She is alert and orientated.  Disposition: Home, she already has a further 5 day course of ciprofloxacin which she will complete. She also has oral metoclopramide at home which she can take as needed. We'll arrange for her to have a followup with a new primary care physician, Dr. Felecia Shelling.  Discharge Orders    Future Orders Please Complete By Expires   Diet - low sodium heart healthy      Increase activity slowly           Signed: Eldredge Veldhuizen C 06/24/2011, 10:10 AM

## 2011-06-27 LAB — URINALYSIS, ROUTINE W REFLEX MICROSCOPIC
Glucose, UA: NEGATIVE
Nitrite: NEGATIVE
Protein, ur: NEGATIVE
pH: 5.5

## 2011-06-27 LAB — BASIC METABOLIC PANEL
CO2: 25
Chloride: 104
GFR calc Af Amer: >60
Sodium: 135

## 2011-06-27 LAB — PREGNANCY, URINE: Preg Test, Ur: NEGATIVE

## 2011-07-04 NOTE — Progress Notes (Signed)
Encounter addended by: Clarene Critchley on: 07/04/2011  2:38 PM<BR>     Documentation filed: Flowsheet VN

## 2011-07-14 ENCOUNTER — Emergency Department (HOSPITAL_COMMUNITY)
Admission: EM | Admit: 2011-07-14 | Discharge: 2011-07-14 | Disposition: A | Discharge status: Home / Self Care | Payer: Medicaid Other | Attending: Emergency Medicine | Admitting: Emergency Medicine

## 2011-07-14 ENCOUNTER — Encounter (HOSPITAL_COMMUNITY): Payer: Self-pay | Admitting: Emergency Medicine

## 2011-07-14 DIAGNOSIS — X500XXA Overexertion from strenuous movement or load, initial encounter: Secondary | ICD-10-CM | POA: Insufficient documentation

## 2011-07-14 DIAGNOSIS — F17200 Nicotine dependence, unspecified, uncomplicated: Secondary | ICD-10-CM | POA: Insufficient documentation

## 2011-07-14 DIAGNOSIS — IMO0002 Reserved for concepts with insufficient information to code with codable children: Secondary | ICD-10-CM | POA: Insufficient documentation

## 2011-07-14 DIAGNOSIS — J45909 Unspecified asthma, uncomplicated: Secondary | ICD-10-CM | POA: Insufficient documentation

## 2011-07-14 DIAGNOSIS — S8990XA Unspecified injury of unspecified lower leg, initial encounter: Secondary | ICD-10-CM

## 2011-07-14 MED ORDER — OXYCODONE-ACETAMINOPHEN 5-325 MG PO TABS: 1.0000 | ORAL_TABLET | ORAL | Status: AC | PRN

## 2011-07-14 NOTE — ED Notes (Signed)
Pt states twisted her knee last night and again this morning. Pt able to stand and walk with no distress noted. Minimal swelling noted around patella.

## 2011-07-14 NOTE — ED Notes (Signed)
Pt states twisted knee this am. Pt ambulatory

## 2011-07-14 NOTE — ED Provider Notes (Signed)
History     CSN: [id number] Arrival date & time: 07/14/2011 10:41 AM  Chief Complaint  Patient presents with  . Knee Pain    HPI Bonnie Jensen is a 35 y.o. female who presents to the ED for knee pain. She twisted her knee last night and then again this morning. Has had similar problems with the same knee in the past. The pain is worse with movement and walking. She denies numbness or tingling. The history was provided by the patient.  Past Medical History  Diagnosis Date  . Asthma     Past Surgical History  Procedure Date  . Appendectomy   . Cholecystectomy   . Tonsillectomy   . Brain surgery   . Tubal ligation     History reviewed. No pertinent family history.  History  Substance Use Topics  . Smoking status: Current Everyday Smoker -- 0.5 packs/day for 15 years    Types: Cigarettes  . Smokeless tobacco: Never Used  . Alcohol Use: No    OB History    Grav Para Term Preterm Abortions TAB SAB Ect Mult Living                  Review of Systems  Constitutional: Negative for fever and chills.  HENT: Negative for neck pain.   Eyes: Negative.   Respiratory: Negative.   Cardiovascular: Negative.   Gastrointestinal: Negative for nausea and vomiting.  Genitourinary:       Recently hospitalized for pyelonephritis.  Musculoskeletal: Positive for back pain.       Right knee pain  Skin: Negative for rash.  Neurological: Negative for dizziness and headaches.    Allergies  Bee venom; Toradol; Imitrex; and Midrin  Home Medications   Current Outpatient Rx  Name Route Sig Dispense Refill  . ACETAMINOPHEN 325 MG PO TABS Oral Take 650 mg by mouth every 6 (six) hours as needed. For fever    . IBUPROFEN 200 MG PO TABS Oral Take 400 mg by mouth every 6 (six) hours as needed. For pain      BP 121/80  Pulse 100  Temp(Src) 99.2 F (37.3 C) (Oral)  Resp 18  Ht 4\' 11"  (1.499 m)  Wt 147 lb (66.679 kg)  BMI 29.69 kg/m2  SpO2 97%  LMP 06/30/2011  Physical Exam    Nursing note and vitals reviewed. Constitutional: She is oriented to person, place, and time. She appears well-developed and well-nourished. No distress.  HENT:  Head: Normocephalic and atraumatic.  Eyes: EOM are normal.  Neck: Normal range of motion. Neck supple.  Pulmonary/Chest: Effort normal and breath sounds normal.  Musculoskeletal: She exhibits tenderness.       Right knee tender with palpation over medial collateral ligament and with ROM. Pedal pulse strong and no vascular compromise.  Neurological: She is alert and oriented to person, place, and time. No cranial nerve deficit.  Skin: Skin is warm and dry.   Patient request referral to Dr. Romeo Apple. She does not want x-ray here today but wants to see him first.   Assessment: Knee ligament strain  Plan:  Knee immobilizer   ICE   Elevate   Follow up with Dr. Romeo Apple   Continue ibuprofen   Percocet as needed. ED Course  Procedures     MDM   [facility], NP 07/14/11 [address], [place] 07/15/11 [phone]

## 2011-07-22 NOTE — ED Provider Notes (Signed)
Evaluation and management procedures were performed by the PA/NP under my supervision/collaboration.    Hugh Garrow D Yukio Bisping, MD 07/22/11 [phone] 

## 2011-08-02 ENCOUNTER — Emergency Department (HOSPITAL_COMMUNITY)
Admission: EM | Admit: 2011-08-02 | Discharge: 2011-08-02 | Disposition: A | Discharge status: Home / Self Care | Payer: Medicaid Other | Attending: Emergency Medicine | Admitting: Emergency Medicine

## 2011-08-02 ENCOUNTER — Encounter (HOSPITAL_COMMUNITY): Payer: Self-pay | Admitting: Emergency Medicine

## 2011-08-02 DIAGNOSIS — R110 Nausea: Secondary | ICD-10-CM | POA: Insufficient documentation

## 2011-08-02 DIAGNOSIS — R109 Unspecified abdominal pain: Secondary | ICD-10-CM | POA: Insufficient documentation

## 2011-08-02 LAB — CBC
HCT: 43.4 % (ref 36.0–46.0)
Hemoglobin: 14.4 g/dL (ref 12.0–15.0)
RDW: 13.1 % (ref 11.5–15.5)
WBC: 12.8 10*3/uL — ABNORMAL HIGH (ref 4.0–10.5)

## 2011-08-02 LAB — URINALYSIS, ROUTINE W REFLEX MICROSCOPIC
Bilirubin Urine: NEGATIVE
Ketones, ur: NEGATIVE mg/dL
Nitrite: NEGATIVE
Protein, ur: NEGATIVE mg/dL
Specific Gravity, Urine: 1.01 (ref 1.005–1.030)
Urobilinogen, UA: 0.2 mg/dL (ref 0.0–1.0)

## 2011-08-02 LAB — DIFFERENTIAL
Basophils Absolute: 0.1 10*3/uL (ref 0.0–0.1)
Basophils Relative: 1 % (ref 0–1)
Lymphocytes Relative: 26 % (ref 12–46)
Monocytes Absolute: 0.6 10*3/uL (ref 0.1–1.0)
Neutro Abs: 8.5 10*3/uL — ABNORMAL HIGH (ref 1.7–7.7)
Neutrophils Relative %: 66 % (ref 43–77)

## 2011-08-02 LAB — BASIC METABOLIC PANEL
CO2: 28 mEq/L (ref 19–32)
Chloride: 100 mEq/L (ref 96–112)
Creatinine, Ser: 0.77 mg/dL (ref 0.50–1.10)
GFR calc Af Amer: >90 mL/min (ref >90)
Potassium: 3.3 mEq/L — ABNORMAL LOW (ref 3.5–5.1)

## 2011-08-02 MED ORDER — SODIUM CHLORIDE 0.9 % IV SOLN: 20.0000 mL | INTRAVENOUS | Status: DC

## 2011-08-02 MED ORDER — HYDROMORPHONE HCL 1 MG/ML IJ SOLN
0.5000 mg | Freq: Once | INTRAMUSCULAR | Status: AC
  Administered 2011-08-02: 0.5 mg via INTRAVENOUS
  Filled 2011-08-02: qty 1

## 2011-08-02 MED ORDER — TRAMADOL HCL 50 MG PO TABS: 50.0000 mg | ORAL_TABLET | Freq: Four times a day (QID) | ORAL | Status: AC | PRN

## 2011-08-02 MED ORDER — SODIUM CHLORIDE 0.9 % IV BOLUS (SEPSIS)
1000.0000 mL | Freq: Once | INTRAVENOUS | Status: AC
  Administered 2011-08-02: 1000 mL via INTRAVENOUS

## 2011-08-02 MED ORDER — ONDANSETRON HCL 4 MG/2ML IJ SOLN
4.0000 mg | Freq: Once | INTRAMUSCULAR | Status: AC
  Administered 2011-08-02: 4 mg via INTRAVENOUS
  Filled 2011-08-02: qty 2

## 2011-08-02 NOTE — ED Notes (Signed)
Pt c/o left flank pain and nausea x 3 days.

## 2011-08-02 NOTE — ED Notes (Signed)
Pt left the er stating no needs 

## 2011-08-02 NOTE — ED Provider Notes (Signed)
History    Scribed for Celene Kras, MD, the patient was seen in room APAH1/APAH1. This chart was scribed by Katha Cabal.   CSN: [id number] Arrival date & time: 08/02/2011  6:31 PM   First MD Initiated Contact with Patient 08/02/11 1858      Chief Complaint  Patient presents with  . Flank Pain  . Nausea    (Consider location/radiation/quality/duration/timing/severity/associated sxs/prior treatment) HPI Bonnie Jensen is a 35 y.o. female who presents to the Emergency Department complaining of moderate constant left flank pain that persists in ED.  Pain is associated with nausea. Pain began 3 days ago and is worsened by movement.  Patient reports similar sx with kidney infection.  Symptoms are not associated with vomiting, diarrhea, chest pain, sore throat, cough, or difficulty breathing.  Patient has taken Tylenol and Motrin for pain.  Pain does not radiate.  Patient with history of asthma.     Past Medical History  Diagnosis Date  . Asthma     Past Surgical History  Procedure Date  . Appendectomy   . Cholecystectomy   . Tonsillectomy   . Brain surgery   . Tubal ligation     History reviewed. No pertinent family history.  History  Substance Use Topics  . Smoking status: Current Everyday Smoker -- 0.5 packs/day for 15 years    Types: Cigarettes  . Smokeless tobacco: Never Used  . Alcohol Use: No    OB History    Grav Para Term Preterm Abortions TAB SAB Ect Mult Living                  Review of Systems  All other systems reviewed and are negative.    Allergies  Bee venom; Toradol; Imitrex; and Midrin  Home Medications   Current Outpatient Rx  Name Route Sig Dispense Refill  . ACETAMINOPHEN 325 MG PO TABS Oral Take 650 mg by mouth every 6 (six) hours as needed. For fever    . IBUPROFEN 200 MG PO TABS Oral Take 400 mg by mouth every 6 (six) hours as needed. For pain      BP 114/82  Pulse 106  Temp(Src) 98.6 F (37 C) (Oral)  Resp 20  Ht 4\' 11"   (1.499 m)  Wt 130 lb (58.968 kg)  BMI 26.26 kg/m2  SpO2 99%  LMP 06/30/2011  Physical Exam  Nursing note and vitals reviewed. Constitutional: She appears well-developed and well-nourished. No distress.  HENT:  Head: Normocephalic and atraumatic.  Right Ear: External ear normal.  Left Ear: External ear normal.  Eyes: Conjunctivae are normal. Right eye exhibits no discharge. Left eye exhibits no discharge. No scleral icterus.  Neck: Neck supple. No tracheal deviation present.  Cardiovascular: Normal rate, regular rhythm and intact distal pulses.   Pulmonary/Chest: Effort normal and breath sounds normal. No stridor. No respiratory distress. She has no wheezes. She has no rales.  Abdominal: Soft. Bowel sounds are normal. She exhibits no distension. There is no tenderness. There is CVA tenderness (mild left). There is no rebound and no guarding.  Musculoskeletal: She exhibits no edema and no tenderness.  Neurological: She is alert. She has normal strength. No sensory deficit. Cranial nerve deficit:  no gross defecits noted. She exhibits normal muscle tone. She displays no seizure activity. Coordination normal.  Skin: Skin is warm and dry. No rash noted.  Psychiatric: She has a normal mood and affect.    ED Course  Procedures (including critical care time)   DIAGNOSTIC  STUDIES: Oxygen Saturation is 99% on room air, normal by my interpretation.    COORDINATION OF CARE:   7:11 PM  Physical exam complete. Will order labs and pain control.      LABS / RADIOLOGY:   Labs Reviewed  CBC - Abnormal; Notable for the following:    WBC 12.8 (*)    All other components within normal limits  DIFFERENTIAL - Abnormal; Notable for the following:    Neutro Abs 8.5 (*)    All other components within normal limits  BASIC METABOLIC PANEL - Abnormal; Notable for the following:    Potassium 3.3 (*)    All other components within normal limits  URINALYSIS, ROUTINE W REFLEX MICROSCOPIC - Abnormal;  Notable for the following:    Appearance HAZY (*)    All other components within normal limits  POCT PREGNANCY, URINE  POCT PREGNANCY, URINE  PREGNANCY, URINE  URINE CULTURE   No results found.       MDM   MDM: Patient does not have any signs of urinary tract infection. There is no hematuria to suggest a kidney stone. Patient has had problems with pain in her flank as noted in her old records. I suspect this could be musculoskeletal in nature. At this time there does not appear to be any evidence of any acute emergency medical condition.     MEDICATIONS GIVEN IN THE E.D. Scheduled Meds:    . HYDROmorphone  0.5 mg Intravenous Once  . ondansetron  4 mg Intravenous Once  . sodium chloride  1,000 mL Intravenous Once   Continuous Infusions:    . sodium chloride         IMPRESSION: Flank pain     I personally performed the services described in this documentation, which was scribed in my presence.  The recorded information has been reviewed and considered.            Celene Kras, MD 08/02/11 [phone]

## 2011-08-04 LAB — URINE CULTURE

## 2011-08-11 ENCOUNTER — Emergency Department (HOSPITAL_COMMUNITY)
Admission: EM | Admit: 2011-08-11 | Discharge: 2011-08-11 | Disposition: A | Discharge status: Home / Self Care | Payer: Medicaid Other | Attending: Emergency Medicine | Admitting: Emergency Medicine

## 2011-08-11 ENCOUNTER — Encounter (HOSPITAL_COMMUNITY): Payer: Self-pay | Admitting: Emergency Medicine

## 2011-08-11 DIAGNOSIS — M5416 Radiculopathy, lumbar region: Secondary | ICD-10-CM

## 2011-08-11 DIAGNOSIS — M545 Low back pain, unspecified: Secondary | ICD-10-CM | POA: Insufficient documentation

## 2011-08-11 DIAGNOSIS — IMO0002 Reserved for concepts with insufficient information to code with codable children: Secondary | ICD-10-CM | POA: Insufficient documentation

## 2011-08-11 DIAGNOSIS — J45909 Unspecified asthma, uncomplicated: Secondary | ICD-10-CM | POA: Insufficient documentation

## 2011-08-11 DIAGNOSIS — F17200 Nicotine dependence, unspecified, uncomplicated: Secondary | ICD-10-CM | POA: Insufficient documentation

## 2011-08-11 MED ORDER — HYDROCODONE-ACETAMINOPHEN 5-325 MG PO TABS: ORAL_TABLET | ORAL | Status: DC

## 2011-08-11 MED ORDER — PREDNISONE 50 MG PO TABS: 50.0000 mg | ORAL_TABLET | Freq: Every day | ORAL | Status: AC

## 2011-08-11 MED ORDER — HYDROCODONE-ACETAMINOPHEN 5-325 MG PO TABS
1.0000 | ORAL_TABLET | Freq: Once | ORAL | Status: AC
  Administered 2011-08-11: 1 via ORAL
  Filled 2011-08-11: qty 1

## 2011-08-11 MED ORDER — PREDNISONE 20 MG PO TABS
60.0000 mg | ORAL_TABLET | Freq: Once | ORAL | Status: AC
  Administered 2011-08-11: 60 mg via ORAL
  Filled 2011-08-11: qty 3

## 2011-08-11 NOTE — ED Notes (Signed)
Pt states pain started 3 days ago and today she began feeling nausea.  Denies UTI symptoms, fever/chills at this time.

## 2011-08-11 NOTE — ED Provider Notes (Signed)
History     CSN: [id number] Arrival date & time: 08/11/2011 12:45 PM   First MD Initiated Contact with Patient 08/11/11 1416      Chief Complaint  Patient presents with  . Back Pain    (Consider location/radiation/quality/duration/timing/severity/associated sxs/prior treatment) HPI Comments: The pt has chronic low back pain with an acute exacerbation and radicular pain to R knee.  Says he last PCP wanted to send her to a pain clinic but she wants a referral so that she might get the underlying problem addressed.  The history is provided by the patient. No language interpreter was used.    Past Medical History  Diagnosis Date  . Asthma     Past Surgical History  Procedure Date  . Appendectomy   . Cholecystectomy   . Tonsillectomy   . Brain surgery   . Tubal ligation     History reviewed. No pertinent family history.  History  Substance Use Topics  . Smoking status: Current Everyday Smoker -- 0.5 packs/day for 15 years    Types: Cigarettes  . Smokeless tobacco: Never Used  . Alcohol Use: No    OB History    Grav Para Term Preterm Abortions TAB SAB Ect Mult Living                  Review of Systems  Genitourinary: Negative for dysuria, urgency, frequency, hematuria, vaginal bleeding and vaginal discharge.  Musculoskeletal: Positive for back pain.  All other systems reviewed and are negative.    Allergies  Bee venom; Toradol; Imitrex; and Midrin  Home Medications   Current Outpatient Rx  Name Route Sig Dispense Refill  . ACETAMINOPHEN 325 MG PO TABS Oral Take 650 mg by mouth every 6 (six) hours as needed. For fever    . IBUPROFEN 200 MG PO TABS Oral Take 400 mg by mouth every 6 (six) hours as needed. For pain    . TRAMADOL HCL 50 MG PO TABS Oral Take 1 tablet (50 mg total) by mouth every 6 (six) hours as needed for pain. Maximum dose= 8 tablets per day 15 tablet 0    BP 125/79  Pulse 81  Temp(Src) 97.7 F (36.5 C) (Oral)  Resp 20  Ht 4\' 11"  (1.499  m)  Wt 140 lb (63.504 kg)  BMI 28.28 kg/m2  SpO2 100%  LMP 08/04/2011  Physical Exam  Nursing note and vitals reviewed. Constitutional: She is oriented to person, place, and time. She appears well-developed and well-nourished. No distress.  HENT:  Head: Normocephalic and atraumatic.  Eyes: EOM are normal.  Neck: Normal range of motion.  Cardiovascular: Normal rate, regular rhythm and normal heart sounds.   Pulmonary/Chest: Effort normal and breath sounds normal.  Abdominal: Soft. She exhibits no distension. There is no tenderness.  Musculoskeletal:       Lumbar back: She exhibits decreased range of motion, tenderness and pain. She exhibits no spasm.       Back:  Neurological: She is alert and oriented to person, place, and time. She displays normal reflexes. GCS eye subscore is 4. GCS verbal subscore is 5. GCS motor subscore is 6.  Reflex Scores:      Patellar reflexes are 1+ on the right side and 1+ on the left side.      Achilles reflexes are 1+ on the right side and 1+ on the left side. Skin: Skin is warm and dry.  Psychiatric: She has a normal mood and affect. Judgment normal.    ED  Course  Procedures (including critical care time)  Labs Reviewed - No data to display No results found.   No diagnosis found.    MDM          Worthy Rancher, PA 08/11/11 1450

## 2011-08-11 NOTE — ED Notes (Signed)
Pt c/o pain across lower back x 2-3 days. Nausea since this am. Denies urinary sx's. Pain hurts all the time.

## 2011-08-15 NOTE — ED Provider Notes (Signed)
Medical screening examination/treatment/procedure(s) were performed by non-physician practitioner and as supervising physician I was immediately available for consultation/collaboration.  Nicoletta Dress. Colon Branch, MD 08/15/11 1753

## 2011-08-26 ENCOUNTER — Emergency Department (HOSPITAL_COMMUNITY)
Admission: EM | Admit: 2011-08-26 | Discharge: 2011-08-27 | Disposition: A | Discharge status: Home / Self Care | Payer: Medicaid Other | Attending: Emergency Medicine | Admitting: Emergency Medicine

## 2011-08-26 ENCOUNTER — Encounter (HOSPITAL_COMMUNITY): Payer: Self-pay | Admitting: *Deleted

## 2011-08-26 DIAGNOSIS — F17200 Nicotine dependence, unspecified, uncomplicated: Secondary | ICD-10-CM | POA: Insufficient documentation

## 2011-08-26 DIAGNOSIS — Z9889 Other specified postprocedural states: Secondary | ICD-10-CM | POA: Insufficient documentation

## 2011-08-26 DIAGNOSIS — M545 Low back pain, unspecified: Secondary | ICD-10-CM | POA: Insufficient documentation

## 2011-08-26 DIAGNOSIS — R112 Nausea with vomiting, unspecified: Secondary | ICD-10-CM | POA: Insufficient documentation

## 2011-08-26 DIAGNOSIS — J45909 Unspecified asthma, uncomplicated: Secondary | ICD-10-CM | POA: Insufficient documentation

## 2011-08-26 LAB — PREGNANCY, URINE: Preg Test, Ur: NEGATIVE

## 2011-08-26 NOTE — ED Notes (Signed)
Pt reports constant left side pain that comes around to the llq. Pt also has been vomiting today. Pt denies any increase in frequency or burning w/ urination.

## 2011-08-26 NOTE — ED Provider Notes (Signed)
History     CSN: [id number] Arrival date & time: 08/26/2011 11:32 PM   First MD Initiated Contact with Patient 08/26/11 2337      Chief Complaint  Patient presents with  . Emesis  . Flank Pain    (Consider location/radiation/quality/duration/timing/severity/associated sxs/prior treatment) HPI Pt presents with c/o emesis x 4 today- nonbloody and nonbilious as well as left sided flank pain.  Pt states she has low back pain, but pain similar to this only with prior kidney infections.  No fever/chills.  No abdominal pain or diarrhea.  Pain worse with palpation and movement.  No falls or injuries.  Has not tried anything for her symtpoms.  No other associated symptoms.    Past Medical History  Diagnosis Date  . Asthma     Past Surgical History  Procedure Date  . Appendectomy   . Cholecystectomy   . Tonsillectomy   . Brain surgery   . Tubal ligation     History reviewed. No pertinent family history.  History  Substance Use Topics  . Smoking status: Current Everyday Smoker -- 0.5 packs/day for 15 years    Types: Cigarettes  . Smokeless tobacco: Never Used  . Alcohol Use: No    OB History    Grav Para Term Preterm Abortions TAB SAB Ect Mult Living                  Review of Systems ROS reviewed and otherwise negative except for mentioned in HPI  Allergies  Bee venom; Toradol; Imitrex; and Midrin  Home Medications   Current Outpatient Rx  Name Route Sig Dispense Refill  . ACETAMINOPHEN 325 MG PO TABS Oral Take 650 mg by mouth every 6 (six) hours as needed. For fever    . HYDROCODONE-ACETAMINOPHEN 5-500 MG PO CAPS Oral Take 1 capsule by mouth every 6 (six) hours as needed for pain. 30 capsule 0  . HYDROCODONE-ACETAMINOPHEN 5-325 MG PO TABS  One po q 4-6 hrs prn pain  20 tablet 0  . IBUPROFEN 200 MG PO TABS Oral Take 400 mg by mouth every 6 (six) hours as needed. For pain    . PROMETHAZINE HCL 12.5 MG PO TABS Oral Take 1 tablet (12.5 mg total) by mouth every 6  (six) hours as needed for nausea. 30 tablet 0    BP 111/72  Pulse 73  Temp(Src) 97.5 F (36.4 C) (Oral)  Resp 20  Ht 4\' 11"  (1.499 m)  Wt 140 lb (63.504 kg)  BMI 28.28 kg/m2  SpO2 96%  LMP 08/04/2011 Vitals reviewed Physical Exam Physical Examination: General appearance - alert, well appearing, and in no distress Mental status - alert, oriented to person, place, and time Mouth - mucous membranes moist, pharynx normal without lesions Chest - clear to auscultation, no wheezes, rales or rhonchi, symmetric air entry Heart - normal rate, regular rhythm, normal S1, S2, no murmurs, rubs, clicks or gallops Abdomen - soft, nontender, nondistended, no masses or organomegaly, NABS Back exam - full range of motion, no midline ttp, mild left CVA tenderness and left paraspinous muscle ttp Musculoskeletal - no joint tenderness, deformity or swelling Extremities - peripheral pulses normal, no pedal edema, no clubbing or cyanosis Skin - normal coloration and turgor, no rashes, brisk cap refill  ED Course  Procedures (including critical care time)  Labs Reviewed  URINALYSIS, ROUTINE W REFLEX MICROSCOPIC - Abnormal; Notable for the following:    Hgb urine dipstick TRACE (*)    Ketones, ur TRACE (*)  All other components within normal limits  COMPREHENSIVE METABOLIC PANEL - Abnormal; Notable for the following:    Glucose, Bld 156 (*)    Total Bilirubin 0.2 (*)    All other components within normal limits  LIPASE, BLOOD - Abnormal; Notable for the following:    Lipase 63 (*)    All other components within normal limits  PREGNANCY, URINE  URINE MICROSCOPIC-ADD ON  CBC   No results found.   1. Nausea and vomiting   2. Low back pain       MDM  Patient presenting with emesis today as well as left-sided low back pain. She states the pain feels similar to prior kidney infections. She does have a history of chronic back pain. Her urine does not reveal any signs of infection. There is no  hematuria to suggest renal stone. Her labs are reassuring. She's received normal saline fluid bolus as well as Zofran and small dose of Dilaudid. She has tolerated fluid challenge while in the ED. She is nontoxic and well-hydrated appearing. Plan is for discharge home with antiemetic prescription and hydrocodone for back pain. She was discharged with strict return precautions and is agreeable with this plan.  There is no sign of emergency medical condition present at this time that requires further treatment.        Ethelda Chick, MD 08/27/11 [phone]

## 2011-08-26 NOTE — ED Notes (Signed)
Pt reports left flank pain & vomiting x 1 day.

## 2011-08-27 LAB — COMPREHENSIVE METABOLIC PANEL
Albumin: 4.1 g/dL (ref 3.5–5.2)
Alkaline Phosphatase: 60 U/L (ref 39–117)
BUN: 23 mg/dL (ref 6–23)
Chloride: 104 mEq/L (ref 96–112)
Creatinine, Ser: 0.78 mg/dL (ref 0.50–1.10)
GFR calc Af Amer: >90 mL/min (ref >90)
Glucose, Bld: 156 mg/dL — ABNORMAL HIGH (ref 70–99)
Potassium: 4 mEq/L (ref 3.5–5.1)
Total Bilirubin: 0.2 mg/dL — ABNORMAL LOW (ref 0.3–1.2)
Total Protein: 7.9 g/dL (ref 6.0–8.3)

## 2011-08-27 LAB — CBC
HCT: 39.3 % (ref 36.0–46.0)
Hemoglobin: 13.3 g/dL (ref 12.0–15.0)
MCHC: 33.8 g/dL (ref 30.0–36.0)
MCV: 94 fL (ref 78.0–100.0)
RDW: 13.5 % (ref 11.5–15.5)

## 2011-08-27 LAB — URINALYSIS, ROUTINE W REFLEX MICROSCOPIC
Nitrite: NEGATIVE
Protein, ur: NEGATIVE mg/dL
Specific Gravity, Urine: 1.025 (ref 1.005–1.030)
Urobilinogen, UA: 0.2 mg/dL (ref 0.0–1.0)

## 2011-08-27 LAB — LIPASE, BLOOD: Lipase: 63 U/L — ABNORMAL HIGH (ref 11–59)

## 2011-08-27 LAB — URINE MICROSCOPIC-ADD ON

## 2011-08-27 MED ORDER — PROMETHAZINE HCL 12.5 MG PO TABS: 12.5000 mg | ORAL_TABLET | Freq: Four times a day (QID) | ORAL | Status: DC | PRN

## 2011-08-27 MED ORDER — HYDROCODONE-ACETAMINOPHEN 5-500 MG PO CAPS: 1.0000 | ORAL_CAPSULE | Freq: Four times a day (QID) | ORAL | Status: AC | PRN

## 2011-08-27 MED ORDER — HYDROMORPHONE HCL PF 1 MG/ML IJ SOLN
0.5000 mg | Freq: Once | INTRAMUSCULAR | Status: AC
  Administered 2011-08-27: 0.5 mg via INTRAVENOUS
  Filled 2011-08-27: qty 1

## 2011-08-27 MED ORDER — SODIUM CHLORIDE 0.9 % IV BOLUS (SEPSIS)
1000.0000 mL | Freq: Once | INTRAVENOUS | Status: AC
  Administered 2011-08-27: 1000 mL via INTRAVENOUS

## 2011-08-27 MED ORDER — ONDANSETRON HCL 4 MG/2ML IJ SOLN
4.0000 mg | Freq: Once | INTRAMUSCULAR | Status: AC
  Administered 2011-08-27: 4 mg via INTRAVENOUS
  Filled 2011-08-27: qty 2

## 2011-08-27 NOTE — ED Notes (Signed)
Pt given discharge instructions, paperwork & prescription(s), pt verbalized understanding.   

## 2011-08-27 NOTE — ED Notes (Signed)
Pt given a drink at this time w/ instructions to start off taking small sips. Bed in low position, side rails up x2. NAD noted & no needs voiced at this time.

## 2011-08-27 NOTE — ED Notes (Signed)
Pt requesting pain medication at this time. EDP notified. 

## 2011-10-31 ENCOUNTER — Encounter (HOSPITAL_COMMUNITY): Payer: Self-pay | Admitting: *Deleted

## 2011-10-31 ENCOUNTER — Emergency Department (HOSPITAL_COMMUNITY)
Admission: EM | Admit: 2011-10-31 | Discharge: 2011-11-01 | Disposition: A | Discharge status: Home / Self Care | Payer: Medicaid Other | Attending: Emergency Medicine | Admitting: Emergency Medicine

## 2011-10-31 DIAGNOSIS — G43909 Migraine, unspecified, not intractable, without status migrainosus: Secondary | ICD-10-CM | POA: Insufficient documentation

## 2011-10-31 DIAGNOSIS — R109 Unspecified abdominal pain: Secondary | ICD-10-CM

## 2011-10-31 DIAGNOSIS — F17200 Nicotine dependence, unspecified, uncomplicated: Secondary | ICD-10-CM | POA: Insufficient documentation

## 2011-10-31 DIAGNOSIS — J45909 Unspecified asthma, uncomplicated: Secondary | ICD-10-CM | POA: Insufficient documentation

## 2011-10-31 DIAGNOSIS — R1110 Vomiting, unspecified: Secondary | ICD-10-CM | POA: Insufficient documentation

## 2011-10-31 DIAGNOSIS — R10819 Abdominal tenderness, unspecified site: Secondary | ICD-10-CM | POA: Insufficient documentation

## 2011-10-31 MED ORDER — ONDANSETRON HCL 4 MG/2ML IJ SOLN
4.0000 mg | Freq: Once | INTRAMUSCULAR | Status: AC
  Administered 2011-10-31: 4 mg via INTRAVENOUS
  Filled 2011-10-31: qty 2

## 2011-10-31 MED ORDER — HYDROMORPHONE HCL PF 1 MG/ML IJ SOLN
1.0000 mg | Freq: Once | INTRAMUSCULAR | Status: AC
  Administered 2011-10-31: 1 mg via INTRAVENOUS
  Filled 2011-10-31: qty 1

## 2011-10-31 MED ORDER — SODIUM CHLORIDE 0.9 % IV SOLN: Freq: Once | INTRAVENOUS | Status: DC

## 2011-10-31 NOTE — ED Notes (Signed)
C/o pressure on bilat. Flank areas and vomiting x 1 week, denies diarrhea

## 2011-10-31 NOTE — ED Provider Notes (Signed)
History   This chart was scribed for [employer]. Colon Branch, MD by Sofie Rower. The patient was seen in room APA19/APA19 and the patient's care was started at 11:29PM.    CSN: [id number]  Arrival date & time 10/31/11  2248   First MD Initiated Contact with Patient 10/31/11 2310      Chief Complaint  Patient presents with  . Emesis  . Abdominal Pain    (Consider location/radiation/quality/duration/timing/severity/associated sxs/prior treatment) HPI  Bonnie Jensen is a 36 y.o. female who presents to the Emergency Department complaining of moderate, constant flank pain onset 7 days ago with associated symptoms of vomiting (vomiting began two days ago). Pt states the pain radiates "on both sides from the back ". Pt has been treating the symptoms at home with tylenol with no relief. The pt states the pain has progressively gotten worse. Pt denies fever, chills, diarrhea, bloating, swelling in feet and ankles. Pt last bowel movement was this morning. Pt is allergic to imitrex and torodol.   Pt does not have a PCP.  Past Medical History  Diagnosis Date  . Asthma   . Migraine     Past Surgical History  Procedure Date  . Appendectomy   . Cholecystectomy   . Tonsillectomy   . Brain surgery   . Tubal ligation     History reviewed. No pertinent family history.  History  Substance Use Topics  . Smoking status: Current Everyday Smoker -- 0.5 packs/day for 15 years    Types: Cigarettes  . Smokeless tobacco: Never Used  . Alcohol Use: No    OB History    Grav Para Term Preterm Abortions TAB SAB Ect Mult Living                  Review of Systems  10 Systems reviewed and are negative for acute change except as noted in the HPI.   Allergies  Bee venom; Toradol; Imitrex; and Midrin  Home Medications   Current Outpatient Rx  Name Route Sig Dispense Refill  . ACETAMINOPHEN 325 MG PO TABS Oral Take 650 mg by mouth every 6 (six) hours as needed. For fever    . IBUPROFEN 200 MG PO  TABS Oral Take 400 mg by mouth every 6 (six) hours as needed. For pain    . HYDROCODONE-ACETAMINOPHEN 5-325 MG PO TABS  One po q 4-6 hrs prn pain  20 tablet 0    BP 126/110  Pulse 93  Temp(Src) 98 F (36.7 C) (Oral)  Resp 20  Ht 5' (1.524 m)  Wt 135 lb (61.236 kg)  BMI 26.37 kg/m2  SpO2 99%  LMP 10/10/2011  Physical Exam  Nursing note and vitals reviewed. Constitutional: She is oriented to person, place, and time. She appears well-developed.  HENT:  Head: Normocephalic and atraumatic.       Poor dentition.  Eyes: Conjunctivae and EOM are normal. No scleral icterus.  Neck: Neck supple. No thyromegaly present.  Cardiovascular: Normal rate, regular rhythm and normal heart sounds.  Exam reveals no gallop and no friction rub.   No murmur heard. Pulmonary/Chest: Effort normal and breath sounds normal. No stridor. She has no wheezes. She has no rales. She exhibits no tenderness.  Abdominal: She exhibits no distension. There is tenderness (Diffuse lower abd pain bilaterally.). There is no rebound.  Genitourinary:       No CVA tenderness.   Musculoskeletal: Normal range of motion. She exhibits tenderness (Mild perispinal lumbar tenderness bilaterally.). She exhibits no edema.  Lymphadenopathy:    She has no cervical adenopathy.  Neurological: She is alert and oriented to person, place, and time. Coordination normal.  Skin: Skin is warm. No rash noted. No erythema.  Psychiatric: She has a normal mood and affect. Her behavior is normal.    ED Course  Procedures (including critical care time)  DIAGNOSTIC STUDIES: Oxygen Saturation is 99% on room air, normal by my interpretation.    COORDINATION OF CARE:  11:30PM- EDP at bedside discusses treatment plan concerning 0045 Patient states she feels better. She asked for additional analgesic. Reviewed lab data.   MDM  Patient with bilateral flank pain with no known injury. Labs unremarkable. Received IVF, analgesic, antiemetic , with  improvement.Pt feels improved after observation and/or treatment in ED.Pt stable in ED with no significant deterioration in condition.The patient appears reasonably screened and/or stabilized for discharge and I doubt any other medical condition or other [facility] requiring further screening, evaluation, or treatment in the ED at this time prior to discharge.  I personally performed the services described in this documentation, which was scribed in my presence. The recorded information has been reviewed and considered.  MDM Reviewed: nursing note and vitals Interpretation: labs         Nicoletta Dress. Colon Branch, MD 11/01/11 [phone]

## 2011-10-31 NOTE — ED Notes (Addendum)
BP rechecked  116/71 now.  Patient tearful, constantly moving on stretcher.  Urine specimen collected.

## 2011-10-31 NOTE — ED Notes (Signed)
Dr Colon Branch in and orders received,

## 2011-11-01 LAB — DIFFERENTIAL
Basophils Absolute: 0.1 10*3/uL (ref 0.0–0.1)
Basophils Relative: 1 % (ref 0–1)
Lymphocytes Relative: 34 % (ref 12–46)
Monocytes Absolute: 0.6 10*3/uL (ref 0.1–1.0)
Neutro Abs: 6.6 10*3/uL (ref 1.7–7.7)
Neutrophils Relative %: 58 % (ref 43–77)

## 2011-11-01 LAB — URINALYSIS, ROUTINE W REFLEX MICROSCOPIC
Glucose, UA: NEGATIVE mg/dL
Ketones, ur: NEGATIVE mg/dL
Leukocytes, UA: NEGATIVE
Nitrite: NEGATIVE
Specific Gravity, Urine: <1.005 — ABNORMAL LOW (ref 1.005–1.030)
pH: 6 (ref 5.0–8.0)

## 2011-11-01 LAB — BASIC METABOLIC PANEL
CO2: 24 mEq/L (ref 19–32)
Chloride: 104 mEq/L (ref 96–112)
GFR calc Af Amer: >90 mL/min (ref >90)
Potassium: 3.5 mEq/L (ref 3.5–5.1)
Sodium: 139 mEq/L (ref 135–145)

## 2011-11-01 LAB — CBC
HCT: 37.5 % (ref 36.0–46.0)
MCHC: 34.4 g/dL (ref 30.0–36.0)
RDW: 13.3 % (ref 11.5–15.5)
WBC: 11.4 10*3/uL — ABNORMAL HIGH (ref 4.0–10.5)

## 2011-11-01 LAB — URINE MICROSCOPIC-ADD ON

## 2011-11-01 MED ORDER — PROMETHAZINE HCL 25 MG PO TABS: 12.5000 mg | ORAL_TABLET | Freq: Four times a day (QID) | ORAL | Status: DC | PRN

## 2011-11-01 MED ORDER — HYDROMORPHONE HCL PF 1 MG/ML IJ SOLN
1.0000 mg | Freq: Once | INTRAMUSCULAR | Status: AC
  Administered 2011-11-01: 1 mg via INTRAVENOUS
  Filled 2011-11-01: qty 1

## 2011-11-01 MED ORDER — HYDROCODONE-ACETAMINOPHEN 5-325 MG PO TABS: 1.0000 | ORAL_TABLET | ORAL | Status: AC | PRN

## 2011-11-01 NOTE — ED Notes (Signed)
While waiting for the RN to return with work note and D/C instructions, this patient removed her own IV.  Stated she was trying to help out.  Discharge instructions reviewed and patient escorted to lobby, as she stated her mother was sleeping in the car waiting for her.  No one present in parking lot.  Patients car there - advised her she could not drive - security made aware.  Patient has called her mother to come to the ED to pick her up.

## 2011-11-01 NOTE — ED Notes (Signed)
Family member arrived to transport patient home.

## 2011-11-01 NOTE — ED Notes (Signed)
Medicated for pain.  Given another sprite to drink and crackers with Pnut butter at request of patient.

## 2011-11-01 NOTE — ED Notes (Signed)
Drank a sprite earlier and has not vomited. Now requesting to try crackers.

## 2011-11-06 ENCOUNTER — Encounter (HOSPITAL_COMMUNITY): Payer: Self-pay

## 2011-11-06 ENCOUNTER — Emergency Department (HOSPITAL_COMMUNITY)
Admission: EM | Admit: 2011-11-06 | Discharge: 2011-11-07 | Disposition: A | Discharge status: Home / Self Care | Payer: Medicaid Other | Attending: Emergency Medicine | Admitting: Emergency Medicine

## 2011-11-06 DIAGNOSIS — Z9079 Acquired absence of other genital organ(s): Secondary | ICD-10-CM | POA: Insufficient documentation

## 2011-11-06 DIAGNOSIS — G43909 Migraine, unspecified, not intractable, without status migrainosus: Secondary | ICD-10-CM | POA: Insufficient documentation

## 2011-11-06 DIAGNOSIS — J45909 Unspecified asthma, uncomplicated: Secondary | ICD-10-CM | POA: Insufficient documentation

## 2011-11-06 DIAGNOSIS — F17200 Nicotine dependence, unspecified, uncomplicated: Secondary | ICD-10-CM | POA: Insufficient documentation

## 2011-11-06 DIAGNOSIS — R109 Unspecified abdominal pain: Secondary | ICD-10-CM | POA: Insufficient documentation

## 2011-11-06 DIAGNOSIS — R112 Nausea with vomiting, unspecified: Secondary | ICD-10-CM | POA: Insufficient documentation

## 2011-11-06 DIAGNOSIS — Z9851 Tubal ligation status: Secondary | ICD-10-CM | POA: Insufficient documentation

## 2011-11-06 MED ORDER — SODIUM CHLORIDE 0.9 % IV BOLUS (SEPSIS)
1000.0000 mL | Freq: Once | INTRAVENOUS | Status: AC
  Administered 2011-11-07: 1000 mL via INTRAVENOUS

## 2011-11-06 MED ORDER — ONDANSETRON HCL 4 MG/2ML IJ SOLN
4.0000 mg | Freq: Once | INTRAMUSCULAR | Status: AC
  Administered 2011-11-07: 4 mg via INTRAVENOUS
  Filled 2011-11-06: qty 2

## 2011-11-06 MED ORDER — SODIUM CHLORIDE 0.9 % IV SOLN: INTRAVENOUS | Status: DC

## 2011-11-06 MED ORDER — HYDROMORPHONE HCL PF 1 MG/ML IJ SOLN
1.0000 mg | Freq: Once | INTRAMUSCULAR | Status: AC
  Administered 2011-11-07: 1 mg via INTRAVENOUS
  Filled 2011-11-06: qty 1

## 2011-11-06 NOTE — ED Notes (Signed)
Pt presents with right flank pain that radiates to abdomen x 2 weeks. Pt states she has also been vomiting.

## 2011-11-07 ENCOUNTER — Emergency Department (HOSPITAL_COMMUNITY): Payer: Medicaid Other

## 2011-11-07 LAB — URINALYSIS, ROUTINE W REFLEX MICROSCOPIC
Glucose, UA: NEGATIVE mg/dL
Hgb urine dipstick: NEGATIVE
Specific Gravity, Urine: 1.02 (ref 1.005–1.030)
pH: 5.5 (ref 5.0–8.0)

## 2011-11-07 LAB — COMPREHENSIVE METABOLIC PANEL
ALT: 18 U/L (ref 0–35)
AST: 19 U/L (ref 0–37)
CO2: 26 mEq/L (ref 19–32)
Calcium: 9.2 mg/dL (ref 8.4–10.5)
Sodium: 136 mEq/L (ref 135–145)
Total Protein: 6.7 g/dL (ref 6.0–8.3)

## 2011-11-07 LAB — CBC
MCH: 32.6 pg (ref 26.0–34.0)
Platelets: 265 10*3/uL (ref 150–400)
RBC: 3.93 MIL/uL (ref 3.87–5.11)

## 2011-11-07 MED ORDER — HYDROMORPHONE HCL PF 2 MG/ML IJ SOLN
INTRAMUSCULAR | Status: AC
  Administered 2011-11-07: 1 mg
  Filled 2011-11-07: qty 1

## 2011-11-07 MED ORDER — ONDANSETRON HCL 4 MG/2ML IJ SOLN
4.0000 mg | Freq: Once | INTRAMUSCULAR | Status: AC
  Administered 2011-11-07: 4 mg via INTRAVENOUS
  Filled 2011-11-07: qty 2

## 2011-11-07 MED ORDER — PROMETHAZINE HCL 25 MG/ML IJ SOLN
12.5000 mg | Freq: Once | INTRAMUSCULAR | Status: AC
  Administered 2011-11-07: 25 mg via INTRAVENOUS
  Filled 2011-11-07: qty 1

## 2011-11-07 MED ORDER — HYDROCODONE-ACETAMINOPHEN 5-325 MG PO TABS: 1.0000 | ORAL_TABLET | Freq: Four times a day (QID) | ORAL | Status: AC | PRN

## 2011-11-07 MED ORDER — HYDROMORPHONE HCL PF 1 MG/ML IJ SOLN: 1.0000 mg | Freq: Once | INTRAMUSCULAR | Status: DC

## 2011-11-07 MED ORDER — SODIUM CHLORIDE 0.9 % IV BOLUS (SEPSIS)
1000.0000 mL | Freq: Once | INTRAVENOUS | Status: AC
  Administered 2011-11-07: 1000 mL via INTRAVENOUS

## 2011-11-07 MED ORDER — HYDROMORPHONE HCL PF 1 MG/ML IJ SOLN
1.0000 mg | Freq: Once | INTRAMUSCULAR | Status: DC
  Filled 2011-11-07: qty 1

## 2011-11-07 MED ORDER — IOHEXOL 300 MG/ML  SOLN
100.0000 mL | Freq: Once | INTRAMUSCULAR | Status: AC | PRN
  Administered 2011-11-07: 100 mL via INTRAVENOUS

## 2011-11-07 MED ORDER — IOHEXOL 300 MG/ML  SOLN
40.0000 mL | Freq: Once | INTRAMUSCULAR | Status: AC | PRN
  Administered 2011-11-07: 40 mL via ORAL

## 2011-11-07 MED ORDER — PROMETHAZINE HCL 25 MG PO TABS: 25.0000 mg | ORAL_TABLET | Freq: Four times a day (QID) | ORAL | Status: DC | PRN

## 2011-11-07 NOTE — ED Notes (Signed)
Pt states pain and nausea better than on arrival pain at 7; nausea better but not completely relieved; no active vomiting noted; pt appears comfortable resting in bed

## 2011-11-07 NOTE — ED Provider Notes (Addendum)
History     CSN: [id number]  Arrival date & time 11/06/11  2223   First MD Initiated Contact with Patient 11/06/11 2310      Chief Complaint  Patient presents with  . Flank Pain  . Abdominal Pain  . Emesis    (Consider location/radiation/quality/duration/timing/severity/associated sxs/prior treatment) Patient is a 36 y.o. female presenting with flank pain, abdominal pain, and vomiting. The history is provided by the patient and the spouse.  Flank Pain This is a recurrent problem. The current episode started more than 1 week ago. The problem occurs constantly. The problem has been gradually worsening. Associated symptoms include abdominal pain. Pertinent negatives include no chest pain, no headaches and no shortness of breath. The symptoms are aggravated by nothing. The symptoms are relieved by nothing.  Abdominal Pain The primary symptoms of the illness include abdominal pain, nausea and vomiting. The primary symptoms of the illness do not include fever, shortness of breath, diarrhea or dysuria.  Symptoms associated with the illness do not include chills, hematuria or back pain.  Emesis  Associated symptoms include abdominal pain. Pertinent negatives include no chills, no cough, no diarrhea, no fever and no headaches.   Onset of abdominal pain 2 weeks ago onset of vomiting one week ago. Just seen in the emergency department for same complaint on January 28. Currently pain is bilateral flank. No diarrhea no blood in the vomit no fever. Patient with history of similar pain seen multiple times in the fall of 2012 without significant findings.   Past Medical History  Diagnosis Date  . Asthma   . Migraine     Past Surgical History  Procedure Date  . Appendectomy   . Cholecystectomy   . Tonsillectomy   . Brain surgery   . Tubal ligation     No family history on file.  History  Substance Use Topics  . Smoking status: Current Everyday Smoker -- 0.5 packs/day for 15 years   Types: Cigarettes  . Smokeless tobacco: Never Used  . Alcohol Use: No    OB History    Grav Para Term Preterm Abortions TAB SAB Ect Mult Living                  Review of Systems  Constitutional: Negative for fever and chills.  HENT: Negative for congestion, neck pain and neck stiffness.   Eyes: Negative for redness.  Respiratory: Negative for cough and shortness of breath.   Cardiovascular: Negative for chest pain.  Gastrointestinal: Positive for nausea, vomiting and abdominal pain. Negative for diarrhea.  Genitourinary: Positive for flank pain. Negative for dysuria and hematuria.  Musculoskeletal: Negative for back pain.  Skin: Negative for rash.  Neurological: Negative for headaches.  Hematological: Does not bruise/bleed easily.    Allergies  Bee venom; Toradol; Imitrex; and Midrin  Home Medications   Current Outpatient Rx  Name Route Sig Dispense Refill  . ACETAMINOPHEN 325 MG PO TABS Oral Take 650 mg by mouth every 6 (six) hours as needed. For fever    . HYDROCODONE-ACETAMINOPHEN 5-325 MG PO TABS  One po q 4-6 hrs prn pain  20 tablet 0  . HYDROCODONE-ACETAMINOPHEN 5-325 MG PO TABS Oral Take 1 tablet by mouth every 4 (four) hours as needed for pain. 15 tablet 0  . HYDROCODONE-ACETAMINOPHEN 5-325 MG PO TABS Oral Take 1-2 tablets by mouth every 6 (six) hours as needed for pain. 10 tablet 0  . IBUPROFEN 200 MG PO TABS Oral Take 400 mg by mouth every  6 (six) hours as needed. For pain    . PROMETHAZINE HCL 25 MG PO TABS Oral Take 0.5 tablets (12.5 mg total) by mouth every 6 (six) hours as needed for nausea. 10 tablet 0  . PROMETHAZINE HCL 25 MG PO TABS Oral Take 1 tablet (25 mg total) by mouth every 6 (six) hours as needed for nausea. 12 tablet 0    BP 131/92  Pulse 96  Temp(Src) 98.1 F (36.7 C) (Oral)  Resp 20  Ht 4\' 11"  (1.499 m)  Wt 135 lb (61.236 kg)  BMI 27.27 kg/m2  SpO2 100%  LMP 10/10/2011  Physical Exam  Nursing note and vitals reviewed. Constitutional:  She is oriented to person, place, and time. She appears well-developed and well-nourished. She appears distressed.  HENT:  Head: Normocephalic and atraumatic.  Mouth/Throat: Oropharynx is clear and moist.  Eyes: Conjunctivae and EOM are normal. Pupils are equal, round, and reactive to light.  Neck: Normal range of motion. Neck supple.  Cardiovascular: Normal rate, regular rhythm and normal heart sounds.   No murmur heard. Pulmonary/Chest: Effort normal and breath sounds normal. No respiratory distress.  Abdominal: Soft. Bowel sounds are normal. There is no tenderness.  Musculoskeletal: Normal range of motion. She exhibits no edema.  Neurological: She is alert and oriented to person, place, and time. No cranial nerve deficit. She exhibits normal muscle tone. Coordination normal.  Skin: Skin is warm. No rash noted.    ED Course  Procedures (including critical care time)  Labs Reviewed  COMPREHENSIVE METABOLIC PANEL - Abnormal; Notable for the following:    Albumin 3.4 (*)    Total Bilirubin 0.1 (*)    All other components within normal limits  PREGNANCY, URINE  CBC  LIPASE, BLOOD  URINALYSIS, ROUTINE W REFLEX MICROSCOPIC  URINALYSIS, WITH MICROSCOPIC   Ct Abdomen Pelvis W Contrast  11/07/2011  *RADIOLOGY REPORT*  Clinical Data: Abdominal pain, flank pain.  CT ABDOMEN AND PELVIS WITH CONTRAST  Technique:  Multidetector CT imaging of the abdomen and pelvis was performed following the standard protocol during bolus administration of intravenous contrast.  Contrast: OMNIPAQUE IOHEXOL 300 MG/ML IV SOLN  Comparison: 06/20/2011  Findings: Bibasilar atelectasis.  Heart size within normal limits.  Unremarkable liver, spleen, pancreas, adrenal glands.  Status post cholecystectomy.  No biliary ductal dilatation.  Symmetric renal enhancement.  No hydronephrosis or hydroureter. Kidneys demonstrate a mildly lobular contour which is nonspecific. No urinary tract calculi identified.  No bowel  obstruction.  No CT evidence for colitis.  The appendix is absent.  No free intraperitoneal air or fluid.  No lymphadenopathy.  Retroaortic left renal vein.  Normal caliber vasculature.  Thin-walled bladder.  Unremarkable CT appearance to the uterus and adnexa.  No acute osseous abnormality.  IMPRESSION: No acute CT abnormality identified.  Original Report Authenticated By: Waneta Martins, M.D.   Results for orders placed during the hospital encounter of 11/06/11  PREGNANCY, URINE      Component Value Range   Preg Test, Ur NEGATIVE  NEGATIVE   CBC      Component Value Range   WBC 9.7  4.0 - 10.5 (K/uL)   RBC 3.93  3.87 - 5.11 (MIL/uL)   Hemoglobin 12.8  12.0 - 15.0 (g/dL)   HCT 40.9  81.1 - 91.4 (%)   MCV 95.7  78.0 - 100.0 (fL)   MCH 32.6  26.0 - 34.0 (pg)   MCHC 34.0  30.0 - 36.0 (g/dL)   RDW 78.2  95.6 -  15.5 (%)   Platelets 265  150 - 400 (K/uL)  COMPREHENSIVE METABOLIC PANEL      Component Value Range   Sodium 136  135 - 145 (mEq/L)   Potassium 3.5  3.5 - 5.1 (mEq/L)   Chloride 101  96 - 112 (mEq/L)   CO2 26  19 - 32 (mEq/L)   Glucose, Bld 84  70 - 99 (mg/dL)   BUN 10  6 - 23 (mg/dL)   Creatinine, Ser 4.09  0.50 - 1.10 (mg/dL)   Calcium 9.2  8.4 - 81.1 (mg/dL)   Total Protein 6.7  6.0 - 8.3 (g/dL)   Albumin 3.4 (*) 3.5 - 5.2 (g/dL)   AST 19  0 - 37 (U/L)   ALT 18  0 - 35 (U/L)   Alkaline Phosphatase 68  39 - 117 (U/L)   Total Bilirubin 0.1 (*) 0.3 - 1.2 (mg/dL)   GFR calc non Af Amer >90  >90 (mL/min)   GFR calc Af Amer >90  >90 (mL/min)  LIPASE, BLOOD      Component Value Range   Lipase 23  11 - 59 (U/L)  URINALYSIS, ROUTINE W REFLEX MICROSCOPIC      Component Value Range   Color, Urine YELLOW  YELLOW    APPearance CLEAR  CLEAR    Specific Gravity, Urine 1.020  1.005 - 1.030    pH 5.5  5.0 - 8.0    Glucose, UA NEGATIVE  NEGATIVE (mg/dL)   Hgb urine dipstick NEGATIVE  NEGATIVE    Bilirubin Urine NEGATIVE  NEGATIVE    Ketones, ur NEGATIVE  NEGATIVE (mg/dL)    Protein, ur NEGATIVE  NEGATIVE (mg/dL)   Urobilinogen, UA 0.2  0.0 - 1.0 (mg/dL)   Nitrite NEGATIVE  NEGATIVE    Leukocytes, UA NEGATIVE  NEGATIVE      1. Abdominal pain       MDM   Patient seen for same 28th of January seen frequently in the fall September October and November for similar complaint. Last CT abdomen was in September we'll repeat it tonight just to verify no significant abdominal pathology they have been negative in the Past. Labs tonight without any significant abnormalities no leukocytosis no evidence of any urinary tract infection. Patient has had severe pain improves some with 3 mg hydromorphone.  Last CT patient had was in September as stated above today CT without any significant findings patient sore in her gallbladder and appendix removed. Etiology of abdominal pain not evident based on today's workup no lab abnormalities no CT abnormality patient's pain is improved in the emergency department will discharge home.   Resource guide provided to the patient to help find her primary care followup.        [name], MD 11/07/11 [id number]  [name], MD 11/07/11 [phone]

## 2011-11-07 NOTE — ED Notes (Addendum)
Pt vomiting while attempting drink contrast; MD notified order received.

## 2011-11-07 NOTE — ED Notes (Signed)
Pt sleeping family at bedside

## 2011-11-07 NOTE — ED Notes (Signed)
Pt sleeping in bed before finishing conversion with RN

## 2011-11-07 NOTE — ED Notes (Signed)
Pt denies pain and nausea at time; pt sleepy but easily awakens with verbal stimuli Questions answered by RN to pt and family

## 2011-12-03 ENCOUNTER — Emergency Department (HOSPITAL_COMMUNITY)
Admission: EM | Admit: 2011-12-03 | Discharge: 2011-12-03 | Disposition: A | Discharge status: Home / Self Care | Payer: Medicaid Other | Attending: Emergency Medicine | Admitting: Emergency Medicine

## 2011-12-03 ENCOUNTER — Encounter (HOSPITAL_COMMUNITY): Payer: Self-pay | Admitting: *Deleted

## 2011-12-03 DIAGNOSIS — R109 Unspecified abdominal pain: Secondary | ICD-10-CM

## 2011-12-03 DIAGNOSIS — J45909 Unspecified asthma, uncomplicated: Secondary | ICD-10-CM | POA: Insufficient documentation

## 2011-12-03 DIAGNOSIS — R112 Nausea with vomiting, unspecified: Secondary | ICD-10-CM | POA: Insufficient documentation

## 2011-12-03 LAB — URINALYSIS, ROUTINE W REFLEX MICROSCOPIC
Glucose, UA: NEGATIVE mg/dL
Leukocytes, UA: NEGATIVE
pH: 6 (ref 5.0–8.0)

## 2011-12-03 LAB — URINE MICROSCOPIC-ADD ON

## 2011-12-03 LAB — PREGNANCY, URINE: Preg Test, Ur: NEGATIVE

## 2011-12-03 MED ORDER — ONDANSETRON HCL 4 MG/2ML IJ SOLN
4.0000 mg | Freq: Once | INTRAMUSCULAR | Status: AC
  Administered 2011-12-03: 4 mg via INTRAVENOUS
  Filled 2011-12-03: qty 2

## 2011-12-03 MED ORDER — HYDROMORPHONE HCL PF 1 MG/ML IJ SOLN
1.0000 mg | Freq: Once | INTRAMUSCULAR | Status: AC
  Administered 2011-12-03: 1 mg via INTRAVENOUS
  Filled 2011-12-03: qty 1

## 2011-12-03 NOTE — ED Notes (Signed)
Pt states right flank pain for the last 3 days & pain has become worse tonight. Pt also advises she has been vomiting today.

## 2011-12-03 NOTE — Discharge Instructions (Signed)
Resource guide provided to help find a primary care provider to for workup of the chronic recurrent abdominal pain. Turn for new or worse symptoms.

## 2011-12-03 NOTE — ED Provider Notes (Signed)
History     CSN: [id number]  Arrival date & time 12/03/11  0128   First MD Initiated Contact with Patient 12/03/11 0310      Chief Complaint  Patient presents with  . Flank Pain  . Emesis    (Consider location/radiation/quality/duration/timing/severity/associated sxs/prior treatment) Patient is a 36 y.o. female presenting with flank pain and vomiting. The history is provided by the patient.  Flank Pain The current episode started more than 2 days ago. The problem occurs constantly. The problem has been gradually worsening. Associated symptoms include abdominal pain. Pertinent negatives include no chest pain, no headaches and no shortness of breath. The symptoms are aggravated by nothing. The symptoms are relieved by nothing.  Emesis  Associated symptoms include abdominal pain. Pertinent negatives include no diarrhea, no fever and no headaches.   Agent well-known to me Bonnie Jensen comes in with complaint of flank pain multiple workups in the past have been negative I saw her the beginning of February with negative labs and data CT at that point in time which showed no abnormality. Patient has a history of chronic recurrent pain in this area seen about twice a month for this in the emergency department.  Past Medical History  Diagnosis Date  . Asthma   . Migraine     Past Surgical History  Procedure Date  . Appendectomy   . Cholecystectomy   . Tonsillectomy   . Brain surgery   . Tubal ligation     History reviewed. No pertinent family history.  History  Substance Use Topics  . Smoking status: Current Everyday Smoker -- 0.5 packs/day for 15 years    Types: Cigarettes  . Smokeless tobacco: Never Used  . Alcohol Use: No    OB History    Grav Para Term Preterm Abortions TAB SAB Ect Mult Living                  Review of Systems  Constitutional: Negative for fever.  HENT: Negative for congestion and neck pain.   Eyes: Negative for redness.  Respiratory: Negative for  shortness of breath.   Cardiovascular: Negative for chest pain.  Gastrointestinal: Positive for nausea, vomiting and abdominal pain. Negative for diarrhea.  Genitourinary: Positive for flank pain. Negative for dysuria and hematuria.  Skin: Negative for rash.  Neurological: Negative for headaches.  Hematological: Does not bruise/bleed easily.    Allergies  Bee venom; Toradol; Imitrex; and Midrin  Home Medications   Current Outpatient Rx  Name Route Sig Dispense Refill  . ACETAMINOPHEN 325 MG PO TABS Oral Take 650 mg by mouth every 6 (six) hours as needed. For fever    . IBUPROFEN 200 MG PO TABS Oral Take 400 mg by mouth every 6 (six) hours as needed. For pain    . HYDROCODONE-ACETAMINOPHEN 5-325 MG PO TABS  One po q 4-6 hrs prn pain  20 tablet 0    BP 136/96  Pulse 104  Temp(Src) 97.8 F (36.6 C) (Oral)  Resp 20  Ht 4\' 11"  (1.499 m)  Wt 160 lb (72.576 kg)  BMI 32.32 kg/m2  SpO2 100%  LMP 11/19/2011  Physical Exam  Nursing note and vitals reviewed. Constitutional: She is oriented to person, place, and time. She appears well-developed and well-nourished.  HENT:  Head: Normocephalic and atraumatic.  Mouth/Throat: Oropharynx is clear and moist.  Eyes: Conjunctivae and EOM are normal. Pupils are equal, round, and reactive to light.  Neck: Normal range of motion. Neck supple.  Cardiovascular: Normal rate,  regular rhythm, normal heart sounds and intact distal pulses.   No murmur heard. Pulmonary/Chest: Effort normal and breath sounds normal. She exhibits no tenderness.  Abdominal: Soft. Bowel sounds are normal. There is no tenderness.  Musculoskeletal: Normal range of motion.  Neurological: She is alert and oriented to person, place, and time. No cranial nerve deficit. She exhibits normal muscle tone. Coordination normal.  Skin: Skin is warm. No rash noted.    ED Course  Procedures (including critical care time)  Labs Reviewed  URINALYSIS, ROUTINE W REFLEX MICROSCOPIC -  Abnormal; Notable for the following:    Specific Gravity, Urine <1.005 (*)    Hgb urine dipstick TRACE (*)    All other components within normal limits  URINE MICROSCOPIC-ADD ON - Abnormal; Notable for the following:    Squamous Epithelial / LPF FEW (*)    All other components within normal limits  PREGNANCY, URINE   No results found. Results for orders placed during the hospital encounter of 12/03/11  URINALYSIS, ROUTINE W REFLEX MICROSCOPIC      Component Value Range   Color, Urine YELLOW  YELLOW    APPearance CLEAR  CLEAR    Specific Gravity, Urine <1.005 (*) 1.005 - 1.030    pH 6.0  5.0 - 8.0    Glucose, UA NEGATIVE  NEGATIVE (mg/dL)   Hgb urine dipstick TRACE (*) NEGATIVE    Bilirubin Urine NEGATIVE  NEGATIVE    Ketones, ur NEGATIVE  NEGATIVE (mg/dL)   Protein, ur NEGATIVE  NEGATIVE (mg/dL)   Urobilinogen, UA 0.2  0.0 - 1.0 (mg/dL)   Nitrite NEGATIVE  NEGATIVE    Leukocytes, UA NEGATIVE  NEGATIVE   PREGNANCY, URINE      Component Value Range   Preg Test, Ur NEGATIVE  NEGATIVE   URINE MICROSCOPIC-ADD ON      Component Value Range   Squamous Epithelial / LPF FEW (*) RARE    WBC, UA 0-2  <3 (WBC/hpf)   RBC / HPF 0-2  <3 (RBC/hpf)   Bacteria, UA RARE  RARE      1. Flank pain       MDM   Patient well-known to me. She he frequently comes to the emergency department with abdominal and flank pain complaint. She's been seen twice this year already and normally seen about twice a month. Urinalysis today is negative when I saw her in the beginning of February I did a CT and complete lab workup without any specific findings review of her past visit show similar findings. We'll treat with Zofran and 1 dose of hydromorphone. Resource guide provided to help her find a primary care Dr.  Abdomen is soft and nontender not consistent with acute surgical abdomen no fever. No evidence urinary tract infection or hematuria.        [name], MD 12/03/11 [phone]

## 2012-01-12 ENCOUNTER — Emergency Department (HOSPITAL_COMMUNITY)
Admission: EM | Admit: 2012-01-12 | Discharge: 2012-01-12 | Disposition: A | Discharge status: Home / Self Care | Payer: Medicaid Other | Attending: Emergency Medicine | Admitting: Emergency Medicine

## 2012-01-12 ENCOUNTER — Emergency Department (HOSPITAL_COMMUNITY): Payer: Medicaid Other

## 2012-01-12 ENCOUNTER — Encounter (HOSPITAL_COMMUNITY): Payer: Self-pay | Admitting: Emergency Medicine

## 2012-01-12 DIAGNOSIS — K529 Noninfective gastroenteritis and colitis, unspecified: Secondary | ICD-10-CM

## 2012-01-12 DIAGNOSIS — F17200 Nicotine dependence, unspecified, uncomplicated: Secondary | ICD-10-CM | POA: Insufficient documentation

## 2012-01-12 DIAGNOSIS — S39012A Strain of muscle, fascia and tendon of lower back, initial encounter: Secondary | ICD-10-CM

## 2012-01-12 DIAGNOSIS — X58XXXA Exposure to other specified factors, initial encounter: Secondary | ICD-10-CM | POA: Insufficient documentation

## 2012-01-12 DIAGNOSIS — S335XXA Sprain of ligaments of lumbar spine, initial encounter: Secondary | ICD-10-CM | POA: Insufficient documentation

## 2012-01-12 DIAGNOSIS — J209 Acute bronchitis, unspecified: Secondary | ICD-10-CM | POA: Insufficient documentation

## 2012-01-12 DIAGNOSIS — J45909 Unspecified asthma, uncomplicated: Secondary | ICD-10-CM | POA: Insufficient documentation

## 2012-01-12 DIAGNOSIS — B349 Viral infection, unspecified: Secondary | ICD-10-CM

## 2012-01-12 DIAGNOSIS — K5289 Other specified noninfective gastroenteritis and colitis: Secondary | ICD-10-CM | POA: Insufficient documentation

## 2012-01-12 DIAGNOSIS — B9789 Other viral agents as the cause of diseases classified elsewhere: Secondary | ICD-10-CM | POA: Insufficient documentation

## 2012-01-12 LAB — DIFFERENTIAL
Basophils Absolute: 0.1 10*3/uL (ref 0.0–0.1)
Basophils Relative: 0 % (ref 0–1)
Eosinophils Absolute: 0.1 10*3/uL (ref 0.0–0.7)
Eosinophils Relative: 1 % (ref 0–5)
Monocytes Absolute: 0.7 10*3/uL (ref 0.1–1.0)

## 2012-01-12 LAB — COMPREHENSIVE METABOLIC PANEL
ALT: 45 U/L — ABNORMAL HIGH (ref 0–35)
Albumin: 3.9 g/dL (ref 3.5–5.2)
Alkaline Phosphatase: 95 U/L (ref 39–117)
BUN: 6 mg/dL (ref 6–23)
Chloride: 104 mEq/L (ref 96–112)
Glucose, Bld: 111 mg/dL — ABNORMAL HIGH (ref 70–99)
Potassium: 4.2 mEq/L (ref 3.5–5.1)
Sodium: 139 mEq/L (ref 135–145)
Total Bilirubin: 0.3 mg/dL (ref 0.3–1.2)
Total Protein: 7.4 g/dL (ref 6.0–8.3)

## 2012-01-12 LAB — CBC
HCT: 41.2 % (ref 36.0–46.0)
MCHC: 34 g/dL (ref 30.0–36.0)
MCV: 98.8 fL (ref 78.0–100.0)
Platelets: 364 10*3/uL (ref 150–400)
RDW: 14.6 % (ref 11.5–15.5)
WBC: 14.8 10*3/uL — ABNORMAL HIGH (ref 4.0–10.5)

## 2012-01-12 LAB — URINALYSIS, ROUTINE W REFLEX MICROSCOPIC
Bilirubin Urine: NEGATIVE
Ketones, ur: NEGATIVE mg/dL
Leukocytes, UA: NEGATIVE
Nitrite: NEGATIVE
Protein, ur: NEGATIVE mg/dL
Urobilinogen, UA: 0.2 mg/dL (ref 0.0–1.0)

## 2012-01-12 LAB — URINE MICROSCOPIC-ADD ON

## 2012-01-12 MED ORDER — ALBUTEROL SULFATE HFA 108 (90 BASE) MCG/ACT IN AERS: 2.0000 | INHALATION_SPRAY | RESPIRATORY_TRACT | Status: DC | PRN

## 2012-01-12 MED ORDER — HYDROMORPHONE HCL PF 1 MG/ML IJ SOLN
1.0000 mg | Freq: Once | INTRAMUSCULAR | Status: AC
  Administered 2012-01-12: 1 mg via INTRAVENOUS
  Filled 2012-01-12: qty 1

## 2012-01-12 MED ORDER — ONDANSETRON HCL 4 MG/2ML IJ SOLN
4.0000 mg | Freq: Once | INTRAMUSCULAR | Status: AC
  Administered 2012-01-12: 4 mg via INTRAVENOUS
  Filled 2012-01-12: qty 2

## 2012-01-12 MED ORDER — FAMOTIDINE IN NACL 20-0.9 MG/50ML-% IV SOLN
20.0000 mg | Freq: Once | INTRAVENOUS | Status: AC
  Administered 2012-01-12: 20 mg via INTRAVENOUS
  Filled 2012-01-12: qty 50

## 2012-01-12 MED ORDER — ALBUTEROL SULFATE HFA 108 (90 BASE) MCG/ACT IN AERS
2.0000 | INHALATION_SPRAY | RESPIRATORY_TRACT | Status: DC | PRN
  Filled 2012-01-12 (×2): qty 6.7

## 2012-01-12 MED ORDER — SODIUM CHLORIDE 0.9 % IV SOLN: INTRAVENOUS | Status: DC

## 2012-01-12 MED ORDER — AZITHROMYCIN 250 MG PO TABS: 250.0000 mg | ORAL_TABLET | Freq: Every day | ORAL | Status: AC

## 2012-01-12 MED ORDER — SODIUM CHLORIDE 0.9 % IV BOLUS (SEPSIS)
1000.0000 mL | Freq: Once | INTRAVENOUS | Status: AC
  Administered 2012-01-12: 1000 mL via INTRAVENOUS

## 2012-01-12 MED ORDER — METHYLPREDNISOLONE SODIUM SUCC 125 MG IJ SOLR
125.0000 mg | Freq: Once | INTRAMUSCULAR | Status: AC
  Administered 2012-01-12: 125 mg via INTRAVENOUS
  Filled 2012-01-12: qty 2

## 2012-01-12 MED ORDER — DOXYCYCLINE HYCLATE 100 MG PO TABS
100.0000 mg | ORAL_TABLET | Freq: Once | ORAL | Status: AC
  Administered 2012-01-12: 100 mg via ORAL
  Filled 2012-01-12: qty 1

## 2012-01-12 MED ORDER — HYDROCODONE-ACETAMINOPHEN 5-325 MG PO TABS: 1.0000 | ORAL_TABLET | ORAL | Status: AC | PRN

## 2012-01-12 MED ORDER — PREDNISONE 20 MG PO TABS: 40.0000 mg | ORAL_TABLET | Freq: Every day | ORAL | Status: DC

## 2012-01-12 MED ORDER — ONDANSETRON HCL 8 MG PO TABS: 8.0000 mg | ORAL_TABLET | Freq: Three times a day (TID) | ORAL | Status: AC | PRN

## 2012-01-12 NOTE — ED Notes (Signed)
Pt states having symptoms since December. Pt states has gotten worse this week. Complains of fever, cough, congestion, nausea and vomiting.

## 2012-01-12 NOTE — Discharge Instructions (Signed)
Acute Bronchitis You have acute bronchitis. This means you have a chest cold. The airways in your lungs are red and sore (inflamed). Acute means it is sudden onset.  CAUSES Bronchitis is most often caused by the same virus that causes a cold. SYMPTOMS   Body aches.   Chest congestion.   Chills.   Cough.   Fever.   Shortness of breath.   Sore throat.  TREATMENT  Acute bronchitis is usually treated with rest, fluids, and medicines for relief of fever or cough. Most symptoms should go away after a few days or a week. Increased fluids may help thin your secretions and will prevent dehydration. Your caregiver may give you an inhaler to improve your symptoms. The inhaler reduces shortness of breath and helps control cough. You can take over-the-counter pain relievers or cough medicine to decrease coughing, pain, or fever. A cool-air vaporizer may help thin bronchial secretions and make it easier to clear your chest. Antibiotics are usually not needed but can be prescribed if you smoke, are seriously ill, have chronic lung problems, are elderly, or you are at higher risk for developing complications.Allergies and asthma can make bronchitis worse. Repeated episodes of bronchitis may cause longstanding lung problems. Avoid smoking and secondhand smoke.Exposure to cigarette smoke or irritating chemicals will make bronchitis worse. If you are a cigarette smoker, consider using nicotine gum or skin patches to help control withdrawal symptoms. Quitting smoking will help your lungs heal faster. Recovery from bronchitis is often slow, but you should start feeling better after 2 to 3 days. Cough from bronchitis frequently lasts for 3 to 4 weeks. To prevent another bout of acute bronchitis:  Quit smoking.   Wash your hands frequently to get rid of viruses or use a hand sanitizer.   Avoid other people with cold or virus symptoms.   Try not to touch your hands to your mouth, nose, or eyes.  SEEK  IMMEDIATE MEDICAL CARE IF:  You develop increased fever, chills, or chest pain.   You have severe shortness of breath or bloody sputum.   You develop dehydration, fainting, repeated vomiting, or a severe headache.   You have no improvement after 1 week of treatment or you get worse.  MAKE SURE YOU:   Understand these instructions.   Will watch your condition.   Will get help right away if you are not doing well or get worse.  Document Released: 10/27/2004 Document Revised: 09/08/2011 Document Reviewed: 01/12/2011 [facility] Patient Information 2012 [place], [place].Back Pain, Adult Low back pain is very common. About 1 in 5 people have back pain.The cause of low back pain is rarely dangerous. The pain often gets better over time.About half of people with a sudden onset of back pain feel better in just 2 weeks. About 8 in 10 people feel better by 6 weeks.  CAUSES Some common causes of back pain include:  Strain of the muscles or ligaments supporting the spine.   Wear and tear (degeneration) of the spinal discs.   Arthritis.   Direct injury to the back.  DIAGNOSIS Most of the time, the direct cause of low back pain is not known.However, back pain can be treated effectively even when the exact cause of the pain is unknown.Answering your caregiver's questions about your overall health and symptoms is one of the most accurate ways to make sure the cause of your pain is not dangerous. If your caregiver needs more information, he or she may order lab work or imaging tests (  X-rays or MRIs).However, even if imaging tests show changes in your back, this usually does not require surgery. HOME CARE INSTRUCTIONS For many people, back pain returns.Since low back pain is rarely dangerous, it is often a condition that people can learn to [facility] their own.   Remain active. It is stressful on the back to sit or stand in one place. Do not sit, drive, or stand in one place for more than 30  minutes at a time. Take short walks on level surfaces as soon as pain allows.Try to increase the length of time you walk each day.   Do not stay in bed.Resting more than 1 or 2 days can delay your recovery.   Do not avoid exercise or work.Your body is made to move.It is not dangerous to be active, even though your back may hurt.Your back will likely heal faster if you return to being active before your pain is gone.   Pay attention to your body when you bend and lift. Many people have less discomfortwhen lifting if they bend their knees, keep the load close to their bodies,and avoid twisting. Often, the most comfortable positions are those that put less stress on your recovering back.   Find a comfortable position to sleep. Use a firm mattress and lie on your side with your knees slightly bent. If you lie on your back, put a pillow under your knees.   Only take over-the-counter or prescription medicines as directed by your caregiver. Over-the-counter medicines to reduce pain and inflammation are often the most helpful.Your caregiver may prescribe muscle relaxant drugs.These medicines help dull your pain so you can more quickly return to your normal activities and healthy exercise.   Put ice on the injured area.   Put ice in a plastic bag.   Place a towel between your skin and the bag.   Leave the ice on for 15 to 20 minutes, 3 to 4 times a day for the first 2 to 3 days. After that, ice and heat may be alternated to reduce pain and spasms.   Ask your caregiver about trying back exercises and gentle massage. This may be of some benefit.   Avoid feeling anxious or stressed.Stress increases muscle tension and can worsen back pain.It is important to recognize when you are anxious or stressed and learn ways to manage it.Exercise is a great option.  SEEK MEDICAL CARE IF:  You have pain that is not relieved with rest or medicine.   You have pain that does not improve in 1 week.    You have new symptoms.   You are generally not feeling well.  SEEK IMMEDIATE MEDICAL CARE IF:   You have pain that radiates from your back into your legs.   You develop new bowel or bladder control problems.   You have unusual weakness or numbness in your arms or legs.   You develop nausea or vomiting.   You develop abdominal pain.   You feel faint.  Document Released: 09/19/2005 Document Revised: 09/08/2011 Document Reviewed: 02/07/2011 [facility] Patient Information 2012 [place], [place].Cough, Adult  A cough is a reflex that helps clear your throat and airways. It can help heal the body or may be a reaction to an irritated airway. A cough may only last 2 or 3 weeks (acute) or may last more than 8 weeks (chronic).  CAUSES Acute cough:  Viral or bacterial infections.  Chronic cough:  Infections.   Allergies.   Asthma.   Post-nasal drip.  Smoking.   Heartburn or acid reflux.   Some medicines.   Chronic lung problems (COPD).   Cancer.  SYMPTOMS   Cough.   Fever.   Chest pain.   Increased breathing rate.   High-pitched whistling sound when breathing (wheezing).   Colored mucus that you cough up (sputum).  TREATMENT   A bacterial cough may be treated with antibiotic medicine.   A viral cough must run its course and will not respond to antibiotics.   Your caregiver may recommend other treatments if you have a chronic cough.  HOME CARE INSTRUCTIONS   Only take over-the-counter or prescription medicines for pain, discomfort, or fever as directed by your caregiver. Use cough suppressants only as directed by your caregiver.   Use a cold steam vaporizer or humidifier in your bedroom or home to help loosen secretions.   Sleep in a semi-upright position if your cough is worse at night.   Rest as needed.   Stop smoking if you smoke.  SEEK IMMEDIATE MEDICAL CARE IF:   You have pus in your sputum.   Your cough starts to worsen.   You cannot control  your cough with suppressants and are losing sleep.   You begin coughing up blood.   You have difficulty breathing.   You develop pain which is getting worse or is uncontrolled with medicine.   You have a fever.  MAKE SURE YOU:   Understand these instructions.   Will watch your condition.   Will get help right away if you are not doing well or get worse.  Document Released: 03/18/2011 Document Revised: 09/08/2011 Document Reviewed: 03/18/2011 [facility] Patient Information 2012 [place], [place].Clear Liquid Diet The clear liquid dietconsists of foods that are liquid or will become liquid at room temperature.You should be able to see through the liquid and beverages. Examples of foods allowed on a clear liquid diet include fruit juice, broth or bouillon, gelatin, or frozen ice pops. The purpose of this diet is to provide necessary fluid, electrolytes such as sodium and potassium, and energy to keep the body functioning during times when you are not able to consume a regular diet.A clear liquid diet should not be continued for long periods of time as it is not nutritionally adequate.  REASONS FOR USING A CLEAR LIQUID DIET  In sudden onset (acute) conditions for a patient before or after surgery.   As the first step in oral feeding.   For fluid and electrolyte replacement in diarrheal diseases.   As a diet before certain medical tests are performed.  ADEQUACY The clear liquid diet is adequate only in ascorbic acid, according to the Recommended Dietary Allowances of the [employer]. CHOOSING FOODS Breads and Starches  Allowed:  None are allowed.   Avoid: All are avoided.  Vegetables  Allowed:  Strained tomato or vegetable juice.   Avoid: Any others.  Fruit  Allowed:  Strained fruit juices and fruit drinks. Include 1 serving of citrus or vitamin C-enriched fruit juice daily.   Avoid: Any others.  Meat and Meat Substitutes  Allowed:  None are allowed.    Avoid: All are avoided.  Milk  Allowed:  None are allowed.   Avoid: All are avoided.  Soups and Combination Foods  Allowed:  Clear bouillon, broth, or strained broth-based soups.   Avoid: Any others.  Desserts and Sweets  Allowed:  Sugar, honey. High protein gelatin. Flavored gelatin, ices, or frozen ice pops that do not contain milk.   Avoid: Any  others.  Fats and Oils  Allowed:  None are allowed.   Avoid: All are avoided.  Beverages  Allowed: Cereal beverages, coffee (regular or decaffeinated), tea, or soda at the discretion of your caregiver.   Avoid: Any others.  Condiments  Allowed:  Iodized salt.   Avoid: Any others, including pepper.  Supplements  Allowed:  Liquid nutrition beverages.   Avoid: Any others that contain lactose or fiber.  SAMPLE MEAL PLAN Breakfast  4 oz (120 mL) strained orange juice.    to 1 cup (125 to 250 mL) gelatin (plain or fortified).   1 cup (250 mL) beverage (coffee or tea).   Sugar, if desired.  Midmorning Snack   cup (125 mL) gelatin (plain or fortified).  Lunch  1 cup (250 mL) broth or consomm.   4 oz (120 mL) strained grapefruit juice.    cup (125 mL) gelatin (plain or fortified).   1 cup (250 mL) beverage (coffee or tea).   Sugar, if desired.  Midafternoon Snack   cup (125 mL) fruit ice.    cup (125 mL) strained fruit juice.  Dinner  1 cup (250 mL) broth or consomm.    cup (125 mL) cranberry juice.    cup (125 mL) flavored gelatin (plain or fortified).   1 cup (250 mL) beverage (coffee or tea).   Sugar, if desired.  Evening Snack  4 oz (120 mL) strained apple juice (vitamin C-fortified).    cup (125 mL) flavored gelatin (plain or fortified).  Document Released: 09/19/2005 Document Revised: 09/08/2011 Document Reviewed: 12/17/2010 [facility] Patient Information 2012 [place], [place].Bronchitis Bronchitis is the body's way of reacting to injury and/or infection (inflammation) of the  bronchi. Bronchi are the air tubes that extend from the windpipe into the lungs. If the inflammation becomes severe, it may cause shortness of breath. CAUSES  Inflammation may be caused by:  A virus.   Germs (bacteria).   Dust.   Allergens.   Pollutants and many other irritants.  The cells lining the bronchial tree are covered with tiny hairs (cilia). These constantly beat upward, away from the lungs, toward the mouth. This keeps the lungs free of pollutants. When these cells become too irritated and are unable to do their job, mucus begins to develop. This causes the characteristic cough of bronchitis. The cough clears the lungs when the cilia are unable to do their job. Without either of these protective mechanisms, the mucus would settle in the lungs. Then you would develop pneumonia. Smoking is a common cause of bronchitis and can contribute to pneumonia. Stopping this habit is the single most important thing you can do to help yourself. TREATMENT   Your caregiver may prescribe an antibiotic if the cough is caused by bacteria. Also, medicines that open up your airways make it easier to breathe. Your caregiver may also recommend or prescribe an expectorant. It will loosen the mucus to be coughed up. Only take over-the-counter or prescription medicines for pain, discomfort, or fever as directed by your caregiver.   Removing whatever causes the problem (smoking, for example) is critical to preventing the problem from getting worse.   Cough suppressants may be prescribed for relief of cough symptoms.   Inhaled medicines may be prescribed to help with symptoms now and to help prevent problems from returning.   For those with recurrent (chronic) bronchitis, there may be a need for steroid medicines.  SEEK IMMEDIATE MEDICAL CARE IF:   During treatment, you develop more pus-like mucus (purulent sputum).  You have a fever.   Your baby is older than 3 months with a rectal temperature of  102 F (38.9 C) or higher.   Your baby is 46 months old or younger with a rectal temperature of 100.4 F (38 C) or higher.   You become progressively more ill.   You have increased difficulty breathing, wheezing, or shortness of breath.  It is necessary to seek immediate medical care if you are elderly or sick from any other disease. MAKE SURE YOU:   Understand these instructions.   Will watch your condition.   Will get help right away if you are not doing well or get worse.  Document Released: 09/19/2005 Document Revised: 09/08/2011 Document Reviewed: 07/29/2008 [facility] Patient Information 2012 [place], [place].Lumbosacral Strain Lumbosacral strain is one of the most common causes of back pain. There are many causes of back pain. Most are not serious conditions. CAUSES  Your backbone (spinal column) is made up of 24 main vertebral bodies, the sacrum, and the coccyx. These are held together by muscles and tough, fibrous tissue (ligaments). Nerve roots pass through the openings between the vertebrae. A sudden move or injury to the back may cause injury to, or pressure on, these nerves. This may result in localized back pain or pain movement (radiation) into the buttocks, down the leg, and into the foot. Sharp, shooting pain from the buttock down the back of the leg (sciatica) is frequently associated with a ruptured (herniated) disk. Pain may be caused by muscle spasm alone. Your caregiver can often find the cause of your pain by the details of your symptoms and an exam. In some cases, you may need tests (such as X-rays). Your caregiver will work with you to decide if any tests are needed based on your specific exam. HOME CARE INSTRUCTIONS   Avoid an underactive lifestyle. Active exercise, as directed by your caregiver, is your greatest weapon against back pain.   Avoid hard physical activities (tennis, racquetball, waterskiing) if you are not in proper physical condition for it. This may  aggravate or create problems.   If you have a back problem, avoid sports requiring sudden body movements. Swimming and walking are generally safer activities.   Maintain good posture.   Avoid becoming overweight (obese).   Use bed rest for only the most extreme, sudden (acute) episode. Your caregiver will help you determine how much bed rest is necessary.   For acute conditions, you may put ice on the injured area.   Put ice in a plastic bag.   Place a towel between your skin and the bag.   Leave the ice on for 15 to 20 minutes at a time, every 2 hours, or as needed.   After you are improved and more active, it may help to apply heat for 30 minutes before activities.  See your caregiver if you are having pain that lasts longer than expected. Your caregiver can advise appropriate exercises or therapy if needed. With conditioning, most back problems can be avoided. SEEK IMMEDIATE MEDICAL CARE IF:   You have numbness, tingling, weakness, or problems with the use of your arms or legs.   You experience severe back pain not relieved with medicines.   There is a change in bowel or bladder control.   You have increasing pain in any area of the body, including your belly (abdomen).   You notice shortness of breath, dizziness, or feel faint.   You feel sick to your stomach (nauseous), are throwing  up (vomiting), or become sweaty.   You notice discoloration of your toes or legs, or your feet get very cold.   Your back pain is getting worse.   You have a fever.  MAKE SURE YOU:   Understand these instructions.   Will watch your condition.   Will get help right away if you are not doing well or get worse.  Document Released: 06/29/2005 Document Revised: 09/08/2011 Document Reviewed: 12/19/2008 [facility] Patient Information 2012 [place], [place].Viral Infections A viral infection can be caused by different types of viruses.Most viral infections are not serious and resolve on their  own. However, some infections may cause severe symptoms and may lead to further complications. SYMPTOMS Viruses can frequently cause:  Minor sore throat.   Aches and pains.   Headaches.   Runny nose.   Different types of rashes.   Watery eyes.   Tiredness.   Cough.   Loss of appetite.   Gastrointestinal infections, resulting in nausea, vomiting, and diarrhea.  These symptoms do not respond to antibiotics because the infection is not caused by bacteria. However, you might catch a bacterial infection following the viral infection. This is sometimes called a "superinfection." Symptoms of such a bacterial infection may include:  Worsening sore throat with pus and difficulty swallowing.   Swollen neck glands.   Chills and a high or persistent fever.   Severe headache.   Tenderness over the sinuses.   Persistent overall ill feeling (malaise), muscle aches, and tiredness (fatigue).   Persistent cough.   Yellow, green, or brown mucus production with coughing.  HOME CARE INSTRUCTIONS   Only take over-the-counter or prescription medicines for pain, discomfort, diarrhea, or fever as directed by your caregiver.   Drink enough water and fluids to keep your urine clear or pale yellow. Sports drinks can provide valuable electrolytes, sugars, and hydration.   Get plenty of rest and maintain proper nutrition. Soups and broths with crackers or rice are fine.  SEEK IMMEDIATE MEDICAL CARE IF:   You have severe headaches, shortness of breath, chest pain, neck pain, or an unusual rash.   You have uncontrolled vomiting, diarrhea, or you are unable to keep down fluids.   You or your child has an oral temperature above 102 F (38.9 C), not controlled by medicine.   Your baby is older than 3 months with a rectal temperature of 102 F (38.9 C) or higher.   Your baby is 34 months old or younger with a rectal temperature of 100.4 F (38 C) or higher.  MAKE SURE YOU:   Understand  these instructions.   Will watch your condition.   Will get help right away if you are not doing well or get worse.  Document Released: 06/29/2005 Document Revised: 09/08/2011 Document Reviewed: 01/24/2011 [facility] Patient Information 2012 [place], [place].Viral Gastroenteritis Viral gastroenteritis is also known as stomach flu. This condition affects the stomach and intestinal tract. It can cause sudden diarrhea and vomiting. The illness typically lasts 3 to 8 days. Most people develop an immune response that eventually gets rid of the virus. While this natural response develops, the virus can make you quite ill. CAUSES  Many different viruses can cause gastroenteritis, such as rotavirus or noroviruses. You can catch one of these viruses by consuming contaminated food or water. You may also catch a virus by sharing utensils or other personal items with an infected person or by touching a contaminated surface. SYMPTOMS  The most common symptoms are diarrhea and vomiting. These problems  can cause a severe loss of body fluids (dehydration) and a body salt (electrolyte) imbalance. Other symptoms may include:  Fever.   Headache.   Fatigue.   Abdominal pain.  DIAGNOSIS  Your caregiver can usually diagnose viral gastroenteritis based on your symptoms and a physical exam. A stool sample may also be taken to test for the presence of viruses or other infections. TREATMENT  This illness typically goes away on its own. Treatments are aimed at rehydration. The most serious cases of viral gastroenteritis involve vomiting so severely that you are not able to keep fluids down. In these cases, fluids must be given through an intravenous line (IV). HOME CARE INSTRUCTIONS   Drink enough fluids to keep your urine clear or pale yellow. Drink small amounts of fluids frequently and increase the amounts as tolerated.   Ask your caregiver for specific rehydration instructions.   Avoid:   Foods high in sugar.    Alcohol.   Carbonated drinks.   Tobacco.   Juice.   Caffeine drinks.   Extremely hot or cold fluids.   Fatty, greasy foods.   Too much intake of anything at one time.   Dairy products until 24 to 48 hours after diarrhea stops.   You may consume probiotics. Probiotics are active cultures of beneficial bacteria. They may lessen the amount and number of diarrheal stools in adults. Probiotics can be found in yogurt with active cultures and in supplements.   Wash your hands well to avoid spreading the virus.   Only take over-the-counter or prescription medicines for pain, discomfort, or fever as directed by your caregiver. Do not give aspirin to children. Antidiarrheal medicines are not recommended.   Ask your caregiver if you should continue to take your regular prescribed and over-the-counter medicines.   Keep all follow-up appointments as directed by your caregiver.  SEEK IMMEDIATE MEDICAL CARE IF:   You are unable to keep fluids down.   You do not urinate at least once every 6 to 8 hours.   You develop shortness of breath.   You notice blood in your stool or vomit. This may look like coffee grounds.   You have abdominal pain that increases or is concentrated in one small area (localized).   You have persistent vomiting or diarrhea.   You have a fever.   The patient is a child younger than 3 months, and he or she has a fever.   The patient is a child older than 3 months, and he or she has a fever and persistent symptoms.   The patient is a child older than 3 months, and he or she has a fever and symptoms suddenly get worse.   The patient is a baby, and he or she has no tears when crying.  MAKE SURE YOU:   Understand these instructions.   Will watch your condition.   Will get help right away if you are not doing well or get worse.  Document Released: 09/19/2005 Document Revised: 09/08/2011 Document Reviewed: 07/06/2011 [facility] Patient Information 2012  [place], [place]. RESOURCE GUIDE  Dental Problems  Patients with Medicaid: [facility] [phone] [address]  [address] Phone:  [phone]                                                  Phone:  [phone]  If unable to pay or uninsured, contact:  [facility] or [facility]. to become qualified for the adult dental clinic.  Chronic Pain Problems Contact [facility]  [phone] Patients need to be referred by their primary care doctor.  Insufficient Money for Medicine Contact [employer]:  call "211" or [facility] [phone].  No Primary Care Doctor Call [facility]  [phone] Other agencies that provide inexpensive medical care    [facility]  [phone]    [facility]  [phone]    [facility]  [phone]    [facility]  [phone]    [facility]  [phone]    [facility]  [phone]  Psychological Services [facility] Behavior[facility]  [phone] [facility] Services  [phone] [facility]   [phone] (emergency services [phone])  Substance Abuse Resources [facility]  [phone] [facility] [phone] [facility] [phone] Floydene Flock [phone] Residential & Outpatient Substance Abuse Program  [phone]  Abuse/Neglect [facility] Child Abuse Hotline 850-025-1639 [facility] Child Abuse Hotline [phone] (After Hours)  Emergency Shelter [facility] [phone]  M[facility] Homes [facility] [place] [facility] [place] [phone] [facility] [phone]  MRSA Hotline #:   951-040-4785    [facility] Resources  [facility] [place]     [employer]                          [facility] Dept. [address] [employer]                       [address]      [address]  Blondell Reveal Phone:  [phone]                                   Phone:  [phone]                 Phone:  [phone]  [facility] Mental Health Phone:  [phone]  [facility] Child Abuse Hotline [phone] [phone] (After Hours)

## 2012-01-12 NOTE — ED Notes (Signed)
Pt unable to urinate at this time.  

## 2012-01-12 NOTE — ED Provider Notes (Signed)
History  This chart was scribed for Felisa Bonier, MD by Bennett Scrape and Cherlynn Perches. This patient was seen in room APA12/APA12 and the patient's care was started at 12:20PM.   CSN: [id number]  Arrival date & time 01/12/12  0940   First MD Initiated Contact with Patient 01/12/12 1015      Chief Complaint  Patient presents with  . Nausea  . Emesis  . Back Pain  . Flank Pain     The history is provided by the patient. No language interpreter was used.    Bonnie Jensen is a 36 y.o. female who presents to the Emergency Department complaining of 4 days of sudden onset, non-changing emesis with associated nausea. Pt also reports having a persistent fever, cough, and nasal congestion, beginning in December 2012. Pt states that her fever has reached a maximum of 103.9. Fever was measured at 98.2 in the ED. Pt denies any modifying factors. She has taken tylenol and Advil at home with mild improvement in symptoms. She denies having any known sick contacts. She states that she was seen by her PCP for the symptoms and diagnosed with strep throat. She reports taking the medications prescribed with no improvement in symptoms. Pt also complains of back pain that radiates to the right flank. She reports that she has experienced similar episodes of back pain with previous kidney infections but states that the pain radiating is a new symptom. She denies having a h/o of back pain. She denies a fall or injury as the cause of the back pain. 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 Pt denies dysuria, hematuria, chest pain, diaphoresis, HAs, sore throat, and confusion. She has a h/o asthma. Pt is a current everyday smoker and denies alcohol use.     Past Medical History  Diagnosis Date  . Asthma   . Migraine     Past Surgical History  Procedure Date  . Appendectomy   . Cholecystectomy   . Tonsillectomy   . Brain surgery   . Tubal ligation     History reviewed.  No pertinent family history.  History  Substance Use Topics  . Smoking status: Current Everyday Smoker -- 0.5 packs/day for 15 years    Types: Cigarettes  . Smokeless tobacco: Never Used  . Alcohol Use: No     Review of Systems  Constitutional: Positive for fever. Negative for diaphoresis.  HENT: Positive for congestion. Negative for sore throat and neck pain.   Eyes: Negative for pain.  Respiratory: Positive for cough. Negative for shortness of breath.   Cardiovascular: Negative for chest pain.  Gastrointestinal: Positive for nausea, vomiting and diarrhea.  Genitourinary: Negative for dysuria, urgency and hematuria.  Musculoskeletal: Positive for back pain.  Skin: Negative for rash.  Neurological: Negative for seizures and headaches.  Psychiatric/Behavioral: Negative for confusion.    Allergies  Bee venom; Toradol; Imitrex; and Midrin  Home Medications   Current Outpatient Rx  Name Route Sig Dispense Refill  . ACETAMINOPHEN 325 MG PO TABS Oral Take 650 mg by mouth every 6 (six) hours as needed. For fever    . IBUPROFEN 200 MG PO TABS Oral Take 400 mg by mouth every 6 (six) hours as needed. For pain      Triage Vitals: BP 139/97  Pulse 96  Temp(Src) 98.2 F (36.8 C) (Oral)  Resp 18  Ht 5' (1.524 m)  Wt 150 lb (68.04 kg)  BMI 29.30 kg/m2  SpO2 99%  LMP 01/04/2012  Physical Exam  Nursing note and vitals reviewed. Constitutional: She is oriented to person, place, and time. She appears well-developed and well-nourished. No distress.  HENT:  Head: Normocephalic and atraumatic.  Right Ear: External ear normal.  Left Ear: External ear normal.  Nose: Nose normal.  Mouth/Throat: Oropharynx is clear and moist.       TMs and auditory canals normal bilaterally, oropharynx normal with no erythema or edema, moist mucous membranes  Eyes: Conjunctivae and EOM are normal. Pupils are equal, round, and reactive to light.  Neck: Normal range of motion. Neck supple. No JVD  present. No tracheal deviation present.  Cardiovascular: Normal rate, regular rhythm, normal heart sounds and intact distal pulses.  Exam reveals no gallop and no friction rub.   No murmur heard. Pulmonary/Chest: Effort normal. No accessory muscle usage or stridor. Not tachypneic. No respiratory distress. She has no decreased breath sounds. She has wheezes (expiratory wheeze bilaterally) in the right upper field, the right middle field, the left upper field and the left middle field. She has no rhonchi. She has no rales. She exhibits no tenderness.       No rhonchi  Abdominal: Soft. Bowel sounds are normal. She exhibits no distension and no mass. There is no tenderness. There is no rebound and no guarding.  Musculoskeletal: Normal range of motion. She exhibits tenderness (flank tenderness, no lumbar tenderness). She exhibits no edema.  Lymphadenopathy:    She has no cervical adenopathy.  Neurological: She is alert and oriented to person, place, and time. She has normal reflexes. No cranial nerve deficit. She exhibits normal muscle tone. Coordination normal.  Skin: Skin is warm and dry. No rash noted. She is not diaphoretic. No erythema. No pallor.  Psychiatric: She has a normal mood and affect. Her behavior is normal. Judgment and thought content normal.    ED Course  Procedures (including critical care time)  DIAGNOSTIC STUDIES: Oxygen Saturation is 99% on room air, normal by my interpretation.    COORDINATION OF CARE: 12:25PM - Will give antibiotics to treat cough, congestion, and fever and medication to control nausea and vomiting. Discussed this treatment plan with pt and she agrees.    Labs Reviewed  COMPREHENSIVE METABOLIC PANEL - Abnormal; Notable for the following:    Glucose, Bld 111 (*)    ALT 45 (*)    All other components within normal limits  CBC - Abnormal; Notable for the following:    WBC 14.8 (*)    All other components within normal limits  DIFFERENTIAL - Abnormal;  Notable for the following:    Neutrophils Relative 82 (*)    Neutro Abs 12.1 (*)    All other components within normal limits  URINALYSIS, ROUTINE W REFLEX MICROSCOPIC - Abnormal; Notable for the following:    Specific Gravity, Urine <1.005 (*)    Hgb urine dipstick TRACE (*)    All other components within normal limits  PREGNANCY, URINE  LIPASE, BLOOD  URINE MICROSCOPIC-ADD ON   Dg Chest 2 View  01/12/2012  *RADIOLOGY REPORT*  Clinical Data: History of nausea and vomiting with back pain and flank pain.  CHEST - 2 VIEW  Comparison: 06/18/2011 study.  Findings: There is stable normal appearance of cardiac silhouette. Mediastinal and hilar contours appear stable.  No pulmonary infiltrates or nodules were evident. No pleural abnormality is evident. Bones appear average for age.  IMPRESSION: No acute or active cardiopulmonary or pleural abnormalities are evident.  Original Report Authenticated By: Crawford Givens, M.D.  No diagnosis found.    MDM  Viral upper respiratory tract infection with cough, bronchospasm, acute asthma exacerbation, mild, gastroenteritis, viral, urinary tract infection and renal colic, ureteral lithiasis, musculoskeletal back pain, lumbar strain, pharyngitis, otitis are all entertained amongst other potential etiologies in the differential diagnosis.  I personally performed the services described in this documentation, which was scribed in my presence. The recorded information has been reviewed and considered.       Felisa Bonier, MD 01/12/12 [phone]

## 2012-04-11 ENCOUNTER — Emergency Department (HOSPITAL_COMMUNITY)
Admission: EM | Admit: 2012-04-11 | Discharge: 2012-04-11 | Disposition: A | Discharge status: Home / Self Care | Payer: Medicaid Other | Attending: Emergency Medicine | Admitting: Emergency Medicine

## 2012-04-11 ENCOUNTER — Encounter (HOSPITAL_COMMUNITY): Payer: Self-pay | Admitting: Emergency Medicine

## 2012-04-11 DIAGNOSIS — R197 Diarrhea, unspecified: Secondary | ICD-10-CM | POA: Insufficient documentation

## 2012-04-11 DIAGNOSIS — J45909 Unspecified asthma, uncomplicated: Secondary | ICD-10-CM | POA: Insufficient documentation

## 2012-04-11 DIAGNOSIS — K529 Noninfective gastroenteritis and colitis, unspecified: Secondary | ICD-10-CM

## 2012-04-11 DIAGNOSIS — F17200 Nicotine dependence, unspecified, uncomplicated: Secondary | ICD-10-CM | POA: Insufficient documentation

## 2012-04-11 DIAGNOSIS — R1110 Vomiting, unspecified: Secondary | ICD-10-CM | POA: Insufficient documentation

## 2012-04-11 DIAGNOSIS — K5289 Other specified noninfective gastroenteritis and colitis: Secondary | ICD-10-CM | POA: Insufficient documentation

## 2012-04-11 DIAGNOSIS — Z79899 Other long term (current) drug therapy: Secondary | ICD-10-CM | POA: Insufficient documentation

## 2012-04-11 LAB — COMPREHENSIVE METABOLIC PANEL
Albumin: 3.7 g/dL (ref 3.5–5.2)
BUN: 7 mg/dL (ref 6–23)
CO2: 24 mEq/L (ref 19–32)
Calcium: 9.4 mg/dL (ref 8.4–10.5)
Chloride: 107 mEq/L (ref 96–112)
Creatinine, Ser: 0.73 mg/dL (ref 0.50–1.10)
GFR calc non Af Amer: >90 mL/min (ref >90)
Total Bilirubin: 0.3 mg/dL (ref 0.3–1.2)

## 2012-04-11 LAB — GLUCOSE, CAPILLARY: Glucose-Capillary: 125 mg/dL — ABNORMAL HIGH (ref 70–99)

## 2012-04-11 LAB — CBC WITH DIFFERENTIAL/PLATELET
Basophils Relative: 0 % (ref 0–1)
Eosinophils Relative: 0 % (ref 0–5)
HCT: 41.3 % (ref 36.0–46.0)
Hemoglobin: 13.9 g/dL (ref 12.0–15.0)
MCHC: 33.7 g/dL (ref 30.0–36.0)
MCV: 100 fL (ref 78.0–100.0)
Monocytes Absolute: 0.9 10*3/uL (ref 0.1–1.0)
Monocytes Relative: 5 % (ref 3–12)
Neutro Abs: 14.2 10*3/uL — ABNORMAL HIGH (ref 1.7–7.7)

## 2012-04-11 LAB — URINALYSIS, ROUTINE W REFLEX MICROSCOPIC
Leukocytes, UA: NEGATIVE
Protein, ur: NEGATIVE mg/dL
Urobilinogen, UA: 0.2 mg/dL (ref 0.0–1.0)

## 2012-04-11 MED ORDER — LOPERAMIDE HCL 2 MG PO CAPS
4.0000 mg | ORAL_CAPSULE | Freq: Once | ORAL | Status: AC
  Administered 2012-04-11: 4 mg via ORAL
  Filled 2012-04-11: qty 2

## 2012-04-11 MED ORDER — ONDANSETRON HCL 4 MG/2ML IJ SOLN
4.0000 mg | Freq: Once | INTRAMUSCULAR | Status: AC
  Administered 2012-04-11: 4 mg via INTRAVENOUS
  Filled 2012-04-11: qty 2

## 2012-04-11 MED ORDER — HYDROMORPHONE HCL PF 1 MG/ML IJ SOLN
1.0000 mg | Freq: Once | INTRAMUSCULAR | Status: AC
  Administered 2012-04-11: 1 mg via INTRAVENOUS
  Filled 2012-04-11: qty 1

## 2012-04-11 MED ORDER — SODIUM CHLORIDE 0.9 % IV SOLN
1000.0000 mL | Freq: Once | INTRAVENOUS | Status: AC
  Administered 2012-04-11: 1000 mL via INTRAVENOUS

## 2012-04-11 MED ORDER — ONDANSETRON 4 MG PO TBDP: 4.0000 mg | ORAL_TABLET | Freq: Three times a day (TID) | ORAL | Status: DC | PRN

## 2012-04-11 MED ORDER — SODIUM CHLORIDE 0.9 % IV SOLN
1000.0000 mL | INTRAVENOUS | Status: DC
  Administered 2012-04-11: 1000 mL via INTRAVENOUS

## 2012-04-11 NOTE — ED Notes (Signed)
N/v/d since yesterday. C/o fever but has not checked. C/o abd pressure -states feels like labor pressure. Denies vag bleeding or d/c. Denies black/bloody stool/emesis. Mm wet. No s/s of dehydration. Rates pain 9

## 2012-04-11 NOTE — ED Provider Notes (Signed)
History  This chart was scribed for [name], MD by Erskine Emery. This patient was seen in room APA19/APA19 and the patient's care was started at 10:26.   CSN: [id number]  Arrival date & time 04/11/12  1024   First MD Initiated Contact with Patient 04/11/12 1026      Chief Complaint  Patient presents with  . Emesis  . Diarrhea    (Consider location/radiation/quality/duration/timing/severity/associated sxs/prior treatment) HPI Comments: States she has had ~ 10 episodes each of vomiting and diarrhea since yesterday.  Last occurred about one hr PTA.  Temp of 103+ orally yest has diffuse abdominal pain.  "i feel like i am in labor when i stand up.  Like everything is going to fall out of me".  No blood in emesis or stools.  Taking no antibiotics.  No h/o crohns, IBS or diverticulitis.  No vaginal bleeding or d/c.  Denies UTI sxs except that she has polyuria and polydipsia.  No personal h/o DM but extensive FH.  Denies orthostatic sxs.  Taking dramamine for nausea with no relief.  S/p cholecystectomy, appendectomy and BTL.  LMP ~ 1 week ago.  No PCP.  The history is provided by the patient. No language interpreter was used.    Bonnie Jensen is a 36 y.o. female who presents to the Emergency Department complaining of    Past Medical History  Diagnosis Date  . Asthma   . Migraine     Past Surgical History  Procedure Date  . Appendectomy   . Cholecystectomy   . Tonsillectomy   . Brain surgery   . Tubal ligation     History reviewed. No pertinent family history.  History  Substance Use Topics  . Smoking status: Current Everyday Smoker -- 0.5 packs/day for 15 years    Types: Cigarettes  . Smokeless tobacco: Never Used  . Alcohol Use: No    OB History    Grav Para Term Preterm Abortions TAB SAB Ect Mult Living                  Review of Systems  Constitutional: Positive for fever. Negative for chills.  Gastrointestinal: Positive for nausea, vomiting, abdominal  pain and diarrhea. Negative for blood in stool, abdominal distention and anal bleeding.  Genitourinary: Positive for frequency. Negative for dysuria, urgency, hematuria, vaginal bleeding, vaginal discharge, vaginal pain, menstrual problem and pelvic pain.  All other systems reviewed and are negative.    Allergies  Bee venom; Ketorolac tromethamine; Imitrex; and Isometheptene-apap-dichloral  Home Medications   Current Outpatient Rx  Name Route Sig Dispense Refill  . ACETAMINOPHEN 325 MG PO TABS Oral Take 650 mg by mouth every 6 (six) hours as needed. For fever    . IBUPROFEN 200 MG PO TABS Oral Take 400 mg by mouth every 6 (six) hours as needed. For pain    . MECLIZINE HCL 25 MG PO TABS Oral Take 25 mg by mouth 3 (three) times daily as needed. For nausea, dizziness    . ALBUTEROL SULFATE HFA 108 (90 BASE) MCG/ACT IN AERS Inhalation Inhale 2 puffs into the lungs every 4 (four) hours as needed. For wheezing or shortness of breath    . ONDANSETRON 4 MG PO TBDP Oral Take 1 tablet (4 mg total) by mouth every 8 (eight) hours as needed for nausea. 10 tablet 0    BP 122/84  Pulse 89  Temp 98.7 F (37.1 C) (Oral)  Resp 18  SpO2 99%  LMP 04/02/2012  Physical Exam  Nursing note and vitals reviewed. Constitutional: She is oriented to person, place, and time. She appears well-developed and well-nourished. No distress.  HENT:  Head: Normocephalic and atraumatic.  Eyes: EOM are normal. Pupils are equal, round, and reactive to light.  Neck: Normal range of motion. Neck supple. No tracheal deviation present.  Cardiovascular: Normal rate, regular rhythm and normal heart sounds.   Pulmonary/Chest: Effort normal and breath sounds normal. No respiratory distress.  Abdominal: Soft. She exhibits no distension. There is no tenderness.  Musculoskeletal: Normal range of motion. She exhibits no edema.  Neurological: She is alert and oriented to person, place, and time. Coordination normal.  Skin: Skin is  warm and dry.  Psychiatric: She has a normal mood and affect. Judgment normal.    ED Course  Procedures (including critical care time)  DIAGNOSTIC STUDIES: Oxygen Saturation is 99% on {room air, {normal by my interpretation.    COORDINATION OF CARE: 1345-pt feeling much better.  No vomitin or diarrhea since ED arrival.  Eating ice chips.  Wants to go home.  Labs Reviewed  CBC WITH DIFFERENTIAL - Abnormal; Notable for the following:    WBC 16.8 (*)     Neutrophils Relative 84 (*)     Neutro Abs 14.2 (*)     Lymphocytes Relative 10 (*)     All other components within normal limits  COMPREHENSIVE METABOLIC PANEL - Abnormal; Notable for the following:    Glucose, Bld 100 (*)     ALT 58 (*)     Alkaline Phosphatase 138 (*)     All other components within normal limits  URINALYSIS, ROUTINE W REFLEX MICROSCOPIC - Abnormal; Notable for the following:    Hgb urine dipstick SMALL (*)     All other components within normal limits  GLUCOSE, CAPILLARY - Abnormal; Notable for the following:    Glucose-Capillary 125 (*)     All other components within normal limits  POCT PREGNANCY, URINE  URINE MICROSCOPIC-ADD ON   No results found.   1. Gastroenteritis       MDM   rx-zofran 4 mg, 10 Sip fluids.  Return prn    Evalina Field, [place] 04/11/12 1356

## 2012-04-11 NOTE — ED Notes (Signed)
Lab having to restick due to hemolized blood from iv draw.

## 2012-04-12 NOTE — ED Provider Notes (Signed)
Medical screening examination/treatment/procedure(s) were performed by non-physician practitioner and as supervising physician I was immediately available for consultation/collaboration.   Rebel Willcutt W. Raekwon Winkowski, MD 04/12/12 1221 

## 2012-04-15 ENCOUNTER — Emergency Department (HOSPITAL_COMMUNITY)
Admission: EM | Admit: 2012-04-15 | Discharge: 2012-04-15 | Disposition: A | Discharge status: Home / Self Care | Payer: Medicaid Other | Attending: Emergency Medicine | Admitting: Emergency Medicine

## 2012-04-15 ENCOUNTER — Emergency Department (HOSPITAL_COMMUNITY): Payer: Medicaid Other

## 2012-04-15 ENCOUNTER — Encounter (HOSPITAL_COMMUNITY): Payer: Self-pay

## 2012-04-15 DIAGNOSIS — N390 Urinary tract infection, site not specified: Secondary | ICD-10-CM | POA: Insufficient documentation

## 2012-04-15 DIAGNOSIS — M7989 Other specified soft tissue disorders: Secondary | ICD-10-CM | POA: Insufficient documentation

## 2012-04-15 DIAGNOSIS — F17200 Nicotine dependence, unspecified, uncomplicated: Secondary | ICD-10-CM | POA: Insufficient documentation

## 2012-04-15 DIAGNOSIS — J45909 Unspecified asthma, uncomplicated: Secondary | ICD-10-CM | POA: Insufficient documentation

## 2012-04-15 DIAGNOSIS — Z9089 Acquired absence of other organs: Secondary | ICD-10-CM | POA: Insufficient documentation

## 2012-04-15 LAB — URINALYSIS, ROUTINE W REFLEX MICROSCOPIC
Leukocytes, UA: NEGATIVE
Nitrite: NEGATIVE
Specific Gravity, Urine: 1.02 (ref 1.005–1.030)
pH: 5.5 (ref 5.0–8.0)

## 2012-04-15 LAB — COMPREHENSIVE METABOLIC PANEL
ALT: 47 U/L — ABNORMAL HIGH (ref 0–35)
AST: 29 U/L (ref 0–37)
Calcium: 10.1 mg/dL (ref 8.4–10.5)
GFR calc Af Amer: >90 mL/min (ref >90)
Glucose, Bld: 112 mg/dL — ABNORMAL HIGH (ref 70–99)
Sodium: 134 mEq/L — ABNORMAL LOW (ref 135–145)
Total Protein: 7.5 g/dL (ref 6.0–8.3)

## 2012-04-15 LAB — CBC WITH DIFFERENTIAL/PLATELET
Basophils Absolute: 0.1 10*3/uL (ref 0.0–0.1)
Eosinophils Absolute: 0.5 10*3/uL (ref 0.0–0.7)
Eosinophils Relative: 4 % (ref 0–5)
MCH: 33.3 pg (ref 26.0–34.0)
MCHC: 33.1 g/dL (ref 30.0–36.0)
MCV: 100.5 fL — ABNORMAL HIGH (ref 78.0–100.0)
Platelets: 328 10*3/uL (ref 150–400)
RDW: 13.8 % (ref 11.5–15.5)

## 2012-04-15 LAB — URINE MICROSCOPIC-ADD ON

## 2012-04-15 MED ORDER — HYDROCODONE-ACETAMINOPHEN 5-325 MG PO TABS: 1.0000 | ORAL_TABLET | Freq: Four times a day (QID) | ORAL | Status: DC | PRN

## 2012-04-15 MED ORDER — ONDANSETRON HCL 4 MG/2ML IJ SOLN
4.0000 mg | Freq: Once | INTRAMUSCULAR | Status: AC
  Administered 2012-04-15: 4 mg via INTRAVENOUS
  Filled 2012-04-15: qty 2

## 2012-04-15 MED ORDER — HYDROMORPHONE HCL PF 1 MG/ML IJ SOLN
1.0000 mg | Freq: Once | INTRAMUSCULAR | Status: AC
  Administered 2012-04-15: 1 mg via INTRAVENOUS
  Filled 2012-04-15: qty 1

## 2012-04-15 MED ORDER — CEPHALEXIN 500 MG PO CAPS: 500.0000 mg | ORAL_CAPSULE | Freq: Four times a day (QID) | ORAL | Status: DC

## 2012-04-15 MED ORDER — CEPHALEXIN 500 MG PO CAPS
500.0000 mg | ORAL_CAPSULE | Freq: Once | ORAL | Status: AC
  Administered 2012-04-15: 500 mg via ORAL
  Filled 2012-04-15: qty 1

## 2012-04-15 MED ORDER — PROMETHAZINE HCL 25 MG PO TABS: 25.0000 mg | ORAL_TABLET | Freq: Four times a day (QID) | ORAL | Status: DC | PRN

## 2012-04-15 NOTE — ED Provider Notes (Signed)
History  This chart was scribed for Bonnie Lennert, MD by Ladona Ridgel Day. This patient was seen in room APA12/APA12 and the patient's care was started at 1147.  CSN: [id number]  Arrival date & time 04/15/12  1147   First MD Initiated Contact with Patient 04/15/12 1221      Chief Complaint  Patient presents with  . Abdominal Pain  . Leg Swelling    Patient is a 36 y.o. female presenting with abdominal pain. The history is provided by the patient. No language interpreter was used.  Abdominal Pain The primary symptoms of the illness include abdominal pain and vomiting (Emesis episode shortly after arrival. ). The primary symptoms of the illness do not include fatigue, shortness of breath or diarrhea. Fever: States had a fever last week that has since been resolved.  The current episode started more than 2 days ago. The onset of the illness was gradual. The problem has been gradually worsening.  The abdominal pain began 2 days ago. The pain came on gradually. The abdominal pain is generalized.  Symptoms associated with the illness do not include hematuria, frequency or back pain.   Bonnie Jensen is a 36 y.o. female who presents to the Emergency Department complaining of constant abdominal pain which began 10 days ago when she was seen for similar symptoms. She states it feels like she's pregnant because her abdomen feels swollen as well as her BLE. She states a fever last week that has since been resolved and her legs started swelling sometime last PM as associated symptoms. She was told that she had "intestinal swelling" when seen here by Dr. Hyacinth Meeker.  She states emesis episode shortly after arrival to the ED.She denies any modifying factors for her symptoms.   She has no PCP.  Past Medical History  Diagnosis Date  . Asthma   . Migraine     Past Surgical History  Procedure Date  . Appendectomy   . Cholecystectomy   . Tonsillectomy   . Brain surgery   . Tubal ligation     No family  history on file.  History  Substance Use Topics  . Smoking status: Current Everyday Smoker -- 0.5 packs/day for 15 years    Types: Cigarettes  . Smokeless tobacco: Never Used  . Alcohol Use: No    OB History    Grav Para Term Preterm Abortions TAB SAB Ect Mult Living                  Review of Systems  Constitutional: Negative for fatigue. Fever: States had a fever last week that has since been resolved.   HENT: Negative for congestion, sinus pressure and ear discharge.   Eyes: Negative for discharge.  Respiratory: Negative for cough and shortness of breath.   Cardiovascular: Negative for chest pain.  Gastrointestinal: Positive for vomiting (Emesis episode shortly after arrival. ) and abdominal pain. Negative for diarrhea.  Genitourinary: Negative for frequency and hematuria.  Musculoskeletal: Negative for back pain.  Skin: Negative for rash.  Neurological: Negative for seizures and headaches.  Hematological: Negative.   Psychiatric/Behavioral: Negative for hallucinations.  All other systems reviewed and are negative.    Allergies  Bee venom; Ketorolac tromethamine; Imitrex; and Isometheptene-apap-dichloral  Home Medications   Current Outpatient Rx  Name Route Sig Dispense Refill  . ACETAMINOPHEN 325 MG PO TABS Oral Take 650 mg by mouth every 6 (six) hours as needed. For fever    . ALBUTEROL SULFATE HFA 108 (90 BASE)  MCG/ACT IN AERS Inhalation Inhale 2 puffs into the lungs every 4 (four) hours as needed. For wheezing or shortness of breath     . IBUPROFEN 200 MG PO TABS Oral Take 400 mg by mouth every 6 (six) hours as needed. For pain    . MECLIZINE HCL 25 MG PO TABS Oral Take 25 mg by mouth 3 (three) times daily as needed. For nausea, dizziness    . ONDANSETRON 4 MG PO TBDP Oral Take 1 tablet (4 mg total) by mouth every 8 (eight) hours as needed for nausea. 10 tablet 0    Triage Vitals: BP 115/83  Pulse 95  Temp 98.6 F (37 C) (Oral)  Resp 20  Ht 4\' 11"  (1.499 m)   Wt 160 lb (72.576 kg)  BMI 32.32 kg/m2  SpO2 96%  LMP 04/02/2012  Physical Exam  Nursing note and vitals reviewed. Constitutional: She is oriented to person, place, and time. She appears well-developed and well-nourished.  HENT:  Head: Normocephalic and atraumatic.  Eyes: Conjunctivae and EOM are normal. No scleral icterus.  Neck: Neck supple. No thyromegaly present.  Cardiovascular: Normal rate and regular rhythm.  Exam reveals no gallop and no friction rub.   No murmur heard. Pulmonary/Chest: Effort normal. No stridor. She has no wheezes. She has no rales. She exhibits no tenderness.  Abdominal: Soft. She exhibits no distension. There is tenderness (Mildly tender abdomen. ). There is no rebound and no guarding.  Musculoskeletal: Normal range of motion. She exhibits edema (1+ edema bilaterally below her knees. ).  Lymphadenopathy:    She has no cervical adenopathy.  Neurological: She is alert and oriented to person, place, and time. Coordination normal.  Skin: No rash noted. No erythema.  Psychiatric: She has a normal mood and affect. Her behavior is normal.    ED Course  Procedures (including critical care time) DIAGNOSTIC STUDIES: Oxygen Saturation is 95% on room air, adequate by my interpretation.    COORDINATION OF CARE: At 1235 PM Discussed treatment plan with patient which includes urine analysis, nausea medicine, and blood work. Patient agrees.   Labs Reviewed  URINALYSIS, ROUTINE W REFLEX MICROSCOPIC - Abnormal; Notable for the following:    APPearance CLOUDY (*)     Hgb urine dipstick SMALL (*)     All other components within normal limits  URINE MICROSCOPIC-ADD ON - Abnormal; Notable for the following:    Squamous Epithelial / LPF MANY (*)     Bacteria, UA MANY (*)     All other components within normal limits  CBC WITH DIFFERENTIAL - Abnormal; Notable for the following:    MCV 100.5 (*)     All other components within normal limits  POCT PREGNANCY, URINE    COMPREHENSIVE METABOLIC PANEL   Dg Abd Acute W/chest  04/15/2012  *RADIOLOGY REPORT*  Clinical Data: Abdominal pain and distention.  Nausea and vomiting.  ACUTE ABDOMEN SERIES (ABDOMEN 2 VIEW & CHEST 1 VIEW)  Comparison: 06/22/2011  Findings: Large colonic stool burden is noted and significant increase since prior study.  A few scattered air fluid levels seen, however there is no evidence of dilated bowel loops.  No evidence of free air.  Right upper quadrant surgical clips seen from prior cholecystectomy.  The heart size mediastinal contours are normal.  Both lungs are clear.  IMPRESSION:  1.  No acute findings. 2.  Large colonic stool burden; suggest clinical correlation for possible constipation.  Original Report Authenticated By: Danae Orleans, M.D.  No diagnosis found.    MDM   The chart was scribed for me under my direct supervision.  I personally performed the history, physical, and medical decision making and all procedures in the evaluation of this patient.Bonnie Lennert, MD 04/15/12 [phone]

## 2012-04-15 NOTE — ED Notes (Signed)
Pt reports being sick since last Tuesday w/ ab pain and describes "like having a baby" and sides hurt, now her feet/legs are swelling.  Was seen in er last Wednesday and told to return if no better

## 2012-04-15 NOTE — ED Notes (Signed)
Pt requesting pain medicine for pain 9/10.

## 2012-04-15 NOTE — ED Notes (Signed)
PTOC pregnancy test negative

## 2012-04-15 NOTE — ED Notes (Signed)
Discharge instructions reviewed with pt, questions answered. Pt verbalized understanding.  

## 2012-04-17 LAB — URINE CULTURE: Colony Count: NO GROWTH

## 2012-04-21 ENCOUNTER — Encounter (HOSPITAL_COMMUNITY): Payer: Self-pay | Admitting: *Deleted

## 2012-04-21 ENCOUNTER — Emergency Department (HOSPITAL_COMMUNITY)
Admission: EM | Admit: 2012-04-21 | Discharge: 2012-04-21 | Disposition: A | Discharge status: Home / Self Care | Payer: Medicaid Other | Attending: Emergency Medicine | Admitting: Emergency Medicine

## 2012-04-21 ENCOUNTER — Emergency Department (HOSPITAL_COMMUNITY): Payer: Medicaid Other

## 2012-04-21 DIAGNOSIS — F17200 Nicotine dependence, unspecified, uncomplicated: Secondary | ICD-10-CM | POA: Insufficient documentation

## 2012-04-21 DIAGNOSIS — J45909 Unspecified asthma, uncomplicated: Secondary | ICD-10-CM | POA: Insufficient documentation

## 2012-04-21 DIAGNOSIS — Z79899 Other long term (current) drug therapy: Secondary | ICD-10-CM | POA: Insufficient documentation

## 2012-04-21 DIAGNOSIS — R109 Unspecified abdominal pain: Secondary | ICD-10-CM

## 2012-04-21 DIAGNOSIS — Z9089 Acquired absence of other organs: Secondary | ICD-10-CM | POA: Insufficient documentation

## 2012-04-21 LAB — COMPREHENSIVE METABOLIC PANEL
ALT: 17 U/L (ref 0–35)
AST: 15 U/L (ref 0–37)
Albumin: 3.9 g/dL (ref 3.5–5.2)
CO2: 25 mEq/L (ref 19–32)
Calcium: 10.1 mg/dL (ref 8.4–10.5)
Sodium: 139 mEq/L (ref 135–145)
Total Protein: 7.6 g/dL (ref 6.0–8.3)

## 2012-04-21 LAB — CBC WITH DIFFERENTIAL/PLATELET
Basophils Absolute: 0.1 10*3/uL (ref 0.0–0.1)
Basophils Relative: 0 % (ref 0–1)
Eosinophils Absolute: 0.1 10*3/uL (ref 0.0–0.7)
Eosinophils Relative: 1 % (ref 0–5)
Lymphocytes Relative: 12 % (ref 12–46)
MCHC: 34.4 g/dL (ref 30.0–36.0)
MCV: 97.1 fL (ref 78.0–100.0)
Platelets: 359 10*3/uL (ref 150–400)
RDW: 13.6 % (ref 11.5–15.5)
WBC: 15.2 10*3/uL — ABNORMAL HIGH (ref 4.0–10.5)

## 2012-04-21 LAB — URINALYSIS, ROUTINE W REFLEX MICROSCOPIC
Glucose, UA: NEGATIVE mg/dL
Leukocytes, UA: NEGATIVE
Specific Gravity, Urine: <1.005 — ABNORMAL LOW (ref 1.005–1.030)

## 2012-04-21 LAB — POCT PREGNANCY, URINE: Preg Test, Ur: NEGATIVE

## 2012-04-21 LAB — URINE MICROSCOPIC-ADD ON

## 2012-04-21 MED ORDER — METOCLOPRAMIDE HCL 10 MG PO TABS: 10.0000 mg | ORAL_TABLET | Freq: Four times a day (QID) | ORAL | Status: DC | PRN

## 2012-04-21 MED ORDER — DIPHENHYDRAMINE HCL 50 MG/ML IJ SOLN
25.0000 mg | Freq: Once | INTRAMUSCULAR | Status: AC
  Administered 2012-04-21: 25 mg via INTRAVENOUS
  Filled 2012-04-21: qty 1

## 2012-04-21 MED ORDER — PANTOPRAZOLE SODIUM 40 MG PO TBEC: 40.0000 mg | DELAYED_RELEASE_TABLET | Freq: Every day | ORAL | Status: DC

## 2012-04-21 MED ORDER — PANTOPRAZOLE SODIUM 40 MG IV SOLR
40.0000 mg | Freq: Once | INTRAVENOUS | Status: AC
  Administered 2012-04-21: 40 mg via INTRAVENOUS
  Filled 2012-04-21: qty 40

## 2012-04-21 MED ORDER — OXYCODONE-ACETAMINOPHEN 5-325 MG PO TABS: 1.0000 | ORAL_TABLET | ORAL | Status: AC | PRN

## 2012-04-21 MED ORDER — GI COCKTAIL ~~LOC~~
30.0000 mL | Freq: Once | ORAL | Status: AC
  Administered 2012-04-21: 30 mL via ORAL
  Filled 2012-04-21: qty 30

## 2012-04-21 MED ORDER — IOHEXOL 300 MG/ML  SOLN
40.0000 mL | Freq: Once | INTRAMUSCULAR | Status: AC | PRN
  Administered 2012-04-21: 40 mL via ORAL

## 2012-04-21 MED ORDER — HYDROMORPHONE HCL PF 1 MG/ML IJ SOLN
1.0000 mg | Freq: Once | INTRAMUSCULAR | Status: AC
  Administered 2012-04-21: 1 mg via INTRAVENOUS
  Filled 2012-04-21: qty 1

## 2012-04-21 MED ORDER — METOCLOPRAMIDE HCL 5 MG/ML IJ SOLN
10.0000 mg | Freq: Once | INTRAMUSCULAR | Status: AC
  Administered 2012-04-21: 10 mg via INTRAVENOUS
  Filled 2012-04-21: qty 2

## 2012-04-21 MED ORDER — IOHEXOL 300 MG/ML  SOLN
100.0000 mL | Freq: Once | INTRAMUSCULAR | Status: AC | PRN
  Administered 2012-04-21: 100 mL via INTRAVENOUS

## 2012-04-21 MED ORDER — SODIUM CHLORIDE 0.9 % IV SOLN
Freq: Once | INTRAVENOUS | Status: AC
  Administered 2012-04-21: 17:00:00 via INTRAVENOUS

## 2012-04-21 MED ORDER — SODIUM CHLORIDE 0.9 % IV BOLUS (SEPSIS)
1000.0000 mL | Freq: Once | INTRAVENOUS | Status: AC
  Administered 2012-04-21: 1000 mL via INTRAVENOUS

## 2012-04-21 MED ORDER — ONDANSETRON HCL 4 MG/2ML IJ SOLN
4.0000 mg | Freq: Once | INTRAMUSCULAR | Status: AC
  Administered 2012-04-21: 4 mg via INTRAVENOUS
  Filled 2012-04-21: qty 2

## 2012-04-21 NOTE — ED Notes (Signed)
Pt medicated for pain and nausea. NAD. Family at bedside.

## 2012-04-21 NOTE — ED Provider Notes (Signed)
History   This chart was scribed for Bonnie Booze, MD by Shari Heritage. The patient was seen in room APA03/APA03. Patient's care was started at 1502.     CSN: [id number]  Arrival date & time 04/21/12  1502   First MD Initiated Contact with Patient 04/21/12 [phone]      Chief Complaint  Patient presents with  . Abdominal Pain    (Consider location/radiation/quality/duration/timing/severity/associated sxs/prior treatment) Patient is a 36 y.o. female presenting with abdominal pain. The history is provided by the patient. No language interpreter was used.  Abdominal Pain The primary symptoms of the illness include abdominal pain, fever, nausea, vomiting and diarrhea. The current episode started more than 2 days ago. The onset of the illness was gradual. The problem has not changed since onset. The abdominal pain began more than 2 days ago. The pain came on gradually. The abdominal pain has been unchanged since its onset. The abdominal pain is generalized. The abdominal pain does not radiate. The abdominal pain is relieved by nothing.  The fever began today. The fever has been resolved since its onset. The maximum temperature recorded prior to her arrival was 101 to 101.9 F.  The nausea is associated with eating. The nausea is exacerbated by food.  The emesis contains stomach contents.  The patient states that she believes she is currently not pregnant. The patient has had a change in bowel habit. Additional symptoms associated with the illness include chills, diaphoresis and frequency.    Bonnie Jensen is a 36 y.o. female who presents to the Emergency Department complaining of recurrent, waxing and waning, generalized moderate to severe abdominal pain and swelling more than 2 weeks ago. Associated symptoms include nausea, vomiting, diarrhea, decreased appetite, diaphoresis, urinary frequency with decreased urinary output, chills, and fever. Patient states that her maximum fever was 101.3 at home.  Patient's temperature at triage today was 98.3. Patient reports no aggravating factors. Patient states that emesis and bowel movements do not relieve pain. Patient states that she has had a decreased appetite and that nausea worsens after eating. Patient states that she has been taking prescribed medications and followed discharged instructions from previous ED encounters. Patient has taken Zofran, antibiotic and Narco (325) every 6 hours. Patient says that medications have provided moderate relief for a brief period of time, but then symptoms return. Patient has no PCP. Patient with medical h/o asthma and migraines. Surgical h/o appendectomy, cholecystectomy, tonsillectomy, brain surgery and tubal ligation. Patient is a current everyday smoker (1 pack/day).   Previous Chart Review Patient was seen on 04/11/2012 by Dr. Vanetta Mulders and Eber Hong, Pa Complaining of abdominal pain with associated nausea, vomiting, diarrhea, fever and urinary frequency. Patient reported a home temperature measurement of 103. Patient was given Zofran and fluids and was discharged home with a prescription for 10-4mg  tablets of Zofran after showing improvement in the ED. Patient was seen again on 04/14/12 by Dr. Bethann Berkshire complaining of the same abdominal pain. Patient had a urinalysis, urine culture, CBC, metabolic panel and CT of abdomen taken on 04/15/12. All tests were unremarkable for acute findings. Patient was treated for a UTI, but urine culture was negative for infection. Patient was also given anti nausea medication and Narco for pain treatment.  Past Medical History  Diagnosis Date  . Asthma   . Migraine     Past Surgical History  Procedure Date  . Appendectomy   . Cholecystectomy   . Tonsillectomy   . Brain surgery   .  Tubal ligation     History reviewed. No pertinent family history.  History  Substance Use Topics  . Smoking status: Current Everyday Smoker -- 0.5 packs/day for 15 years    Types:  Cigarettes  . Smokeless tobacco: Never Used  . Alcohol Use: No    OB History    Grav Para Term Preterm Abortions TAB SAB Ect Mult Living                  Review of Systems  Constitutional: Positive for fever, chills and diaphoresis.  Gastrointestinal: Positive for nausea, vomiting, abdominal pain and diarrhea.  Genitourinary: Positive for frequency and decreased urine volume.  All other systems reviewed and are negative.    Allergies  Bee venom; Ketorolac tromethamine; Imitrex; and Isometheptene-apap-dichloral  Home Medications   Current Outpatient Rx  Name Route Sig Dispense Refill  . ACETAMINOPHEN 500 MG PO TABS Oral Take 500 mg by mouth every 6 (six) hours as needed. Pain    . CEPHALEXIN 500 MG PO CAPS Oral Take 1 capsule (500 mg total) by mouth 4 (four) times daily. 28 capsule 0  . HYDROCODONE-ACETAMINOPHEN 5-325 MG PO TABS Oral Take 1 tablet by mouth every 6 (six) hours as needed for pain. 20 tablet 0  . IBUPROFEN 200 MG PO TABS Oral Take 800 mg by mouth every 6 (six) hours as needed. For pain    . PROMETHAZINE HCL 12.5 MG PO TABS Oral Take 1 tablet (12.5 mg total) by mouth every 6 (six) hours as needed for nausea. 30 tablet 0  . PROMETHAZINE HCL 25 MG PO TABS Oral Take 1 tablet (25 mg total) by mouth every 6 (six) hours as needed for nausea. 12 tablet 0  . PROMETHAZINE HCL 25 MG PO TABS Oral Take 1 tablet (25 mg total) by mouth every 6 (six) hours as needed for nausea. 20 tablet 0  . PROMETHAZINE HCL 25 MG PO TABS Oral Take 0.5 tablets (12.5 mg total) by mouth every 6 (six) hours as needed for nausea. 10 tablet 0  . PROMETHAZINE HCL 25 MG PO TABS Oral Take 1 tablet (25 mg total) by mouth every 6 (six) hours as needed for nausea. 12 tablet 0  . PROMETHAZINE HCL 25 MG PO TABS Oral Take 1 tablet (25 mg total) by mouth every 6 (six) hours as needed for nausea. 15 tablet 0    BP 132/97  Pulse 94  Temp 98.3 F (36.8 C) (Oral)  Resp 20  Ht 4\' 11"  (1.499 m)  Wt 160 lb  (72.576 kg)  BMI 32.32 kg/m2  SpO2 100%  LMP 04/02/2012  Physical Exam  Nursing note and vitals reviewed. Constitutional: She is oriented to person, place, and time. She appears well-developed and well-nourished.       Appears uncomfortable.  HENT:  Head: Normocephalic and atraumatic.  Eyes: Conjunctivae and EOM are normal. Pupils are equal, round, and reactive to light. No scleral icterus.  Neck: Normal range of motion. Neck supple.  Cardiovascular: Normal rate and regular rhythm.   Murmur heard.  Systolic (Upper right sternal border) murmur is present with a grade of 1/6  Pulmonary/Chest: Effort normal and breath sounds normal.  Abdominal: Soft. Bowel sounds are decreased. There is tenderness (Moderate) in the right upper quadrant and epigastric area. There is no rebound and no guarding.  Musculoskeletal: Normal range of motion. She exhibits edema (Trace).  Neurological: She is alert and oriented to person, place, and time.  Skin: Skin is warm and dry.  Psychiatric: She has a normal mood and affect.    ED Course  Procedures (including critical care time) DIAGNOSTIC STUDIES: Oxygen Saturation is 100% on room air, normal by my interpretation.    COORDINATION OF CARE: 3:19PM- Patient informed of current plan for treatment and evaluation and agrees with plan at this time. Will order a CT scan of abdomen, CBC, comprehensive metabolic panel, urinalysis, and blood lipase test. Will administer Zofran and Dilaudid.  Results for orders placed during the hospital encounter of 04/21/12  CBC WITH DIFFERENTIAL      Component Value Range   WBC 15.2 (*) 4.0 - 10.5 K/uL   RBC 4.46  3.87 - 5.11 MIL/uL   Hemoglobin 14.9  12.0 - 15.0 g/dL   HCT 08.6  57.8 - 46.9 %   MCV 97.1  78.0 - 100.0 fL   MCH 33.4  26.0 - 34.0 pg   MCHC 34.4  30.0 - 36.0 g/dL   RDW 62.9  52.8 - 41.3 %   Platelets 359  150 - 400 K/uL   Neutrophils Relative 81 (*) 43 - 77 %   Neutro Abs 12.4 (*) 1.7 - 7.7 K/uL    Lymphocytes Relative 12  12 - 46 %   Lymphs Abs 1.8  0.7 - 4.0 K/uL   Monocytes Relative 6  3 - 12 %   Monocytes Absolute 0.9  0.1 - 1.0 K/uL   Eosinophils Relative 1  0 - 5 %   Eosinophils Absolute 0.1  0.0 - 0.7 K/uL   Basophils Relative 0  0 - 1 %   Basophils Absolute 0.1  0.0 - 0.1 K/uL  COMPREHENSIVE METABOLIC PANEL      Component Value Range   Sodium 139  135 - 145 mEq/L   Potassium 3.7  3.5 - 5.1 mEq/L   Chloride 102  96 - 112 mEq/L   CO2 25  19 - 32 mEq/L   Glucose, Bld 83  70 - 99 mg/dL   BUN 11  6 - 23 mg/dL   Creatinine, Ser 2.44  0.50 - 1.10 mg/dL   Calcium 01.0  8.4 - 27.2 mg/dL   Total Protein 7.6  6.0 - 8.3 g/dL   Albumin 3.9  3.5 - 5.2 g/dL   AST 15  0 - 37 U/L   ALT 17  0 - 35 U/L   Alkaline Phosphatase 85  39 - 117 U/L   Total Bilirubin 0.5  0.3 - 1.2 mg/dL   GFR calc non Af Amer >90  >90 mL/min   GFR calc Af Amer >90  >90 mL/min  LIPASE, BLOOD      Component Value Range   Lipase 28  11 - 59 U/L  URINALYSIS, ROUTINE W REFLEX MICROSCOPIC      Component Value Range   Color, Urine YELLOW  YELLOW   APPearance CLEAR  CLEAR   Specific Gravity, Urine <1.005 (*) 1.005 - 1.030   pH 6.5  5.0 - 8.0   Glucose, UA NEGATIVE  NEGATIVE mg/dL   Hgb urine dipstick TRACE (*) NEGATIVE   Bilirubin Urine NEGATIVE  NEGATIVE   Ketones, ur NEGATIVE  NEGATIVE mg/dL   Protein, ur NEGATIVE  NEGATIVE mg/dL   Urobilinogen, UA 0.2  0.0 - 1.0 mg/dL   Nitrite NEGATIVE  NEGATIVE   Leukocytes, UA NEGATIVE  NEGATIVE  POCT PREGNANCY, URINE      Component Value Range   Preg Test, Ur NEGATIVE  NEGATIVE  URINE MICROSCOPIC-ADD ON  Component Value Range   Squamous Epithelial / LPF FEW (*) RARE   RBC / HPF 3-6  <3 RBC/hpf   Ct Abdomen Pelvis W Contrast  04/21/2012  *RADIOLOGY REPORT*  Clinical Data: Abdominal pain, nausea/vomiting/diarrhea, prior cholecystectomy and appendectomy  CT ABDOMEN AND PELVIS WITH CONTRAST  Technique:  Multidetector CT imaging of the abdomen and pelvis was  performed following the standard protocol during bolus administration of intravenous contrast.  Contrast: OMNIPAQUE IOHEXOL 300 MG/ML  SOLN  Comparison: 11/07/2011  Findings: Lung bases are clear.  Liver, spleen, pancreas, and adrenal glands are within normal limits.  Status post cholecystectomy.  No intrahepatic or extrahepatic ductal dilatation.  Mildly lobulated renal contour.  Kidneys are otherwise within normal limits.  No hydronephrosis.  No evidence of bowel obstruction.  Prior appendectomy.  No colonic wall thickening or inflammatory changes.  No evidence of abdominal aortic aneurysm.  No abdominopelvic ascites.  No suspicious abdominopelvic lymphadenopathy.  Uterus and bilateral ovaries are unremarkable, noting a 2.3 cm right ovarian cyst / follicle.  Bladder is within normal limits.  Visualized osseous structures are within normal limits.  IMPRESSION: No evidence of bowel obstruction.  Prior appendectomy.  No CT findings to account for the patient's abdominal pain.  Original Report Authenticated By: Charline Bills, M.D.     1. Abdominal pain       MDM  Abdominal pain-etiology unclear. Patient's old records are reviewed and she is to ED visits in the last week for similar complaints with negative workups. CT scan at that time, so CT will be done today.  CT does not show any significant pathology. Laboratory workup is unremarkable. She got moderately with IV hydromorphone and ondansetron. She's given a second dose of hydromorphone and given a dose of metoclopramide, a dose of Pantoprazole, and a GI cocktail with significant relief. At this point, I suspect that she has either GERD or peptic ulcer disease, so she will be treated with a proton pump inhibitor. She is sent home with a prescription for pantoprazole, Reglan, and Percocet.      I personally performed the services described in this documentation, which was scribed in my presence. The recorded information has been reviewed  and considered.     Bonnie Booze, MD 04/21/12 [phone]

## 2012-04-21 NOTE — ED Notes (Signed)
Pt states continued abdominal pain with nausea and vomiting since last visit. Also stated upper abdominal swelling.

## 2012-04-21 NOTE — ED Notes (Signed)
Pt c/o continuing abdominal pain, nausea, vomiting and diarrhea. Still c/o urinary frequency and urinating small amounts.

## 2012-04-30 ENCOUNTER — Ambulatory Visit: Payer: Medicaid Other | Admitting: Gastroenterology

## 2012-04-30 ENCOUNTER — Telehealth: Payer: Self-pay | Admitting: Gastroenterology

## 2012-04-30 NOTE — Telephone Encounter (Signed)
Pt was a no show

## 2012-05-01 NOTE — Telephone Encounter (Signed)
Please offer reschedule. First no show.

## 2012-05-09 NOTE — Telephone Encounter (Signed)
Pt is aware of OV on 8/14 at 130 with AS

## 2012-05-11 ENCOUNTER — Encounter (HOSPITAL_COMMUNITY): Payer: Self-pay | Admitting: *Deleted

## 2012-05-11 ENCOUNTER — Emergency Department (HOSPITAL_COMMUNITY)
Admission: EM | Admit: 2012-05-11 | Discharge: 2012-05-11 | Disposition: A | Discharge status: Home / Self Care | Payer: Medicaid Other | Attending: Emergency Medicine | Admitting: Emergency Medicine

## 2012-05-11 ENCOUNTER — Emergency Department (HOSPITAL_COMMUNITY): Payer: Medicaid Other

## 2012-05-11 DIAGNOSIS — R197 Diarrhea, unspecified: Secondary | ICD-10-CM | POA: Insufficient documentation

## 2012-05-11 DIAGNOSIS — G8929 Other chronic pain: Secondary | ICD-10-CM

## 2012-05-11 DIAGNOSIS — R1110 Vomiting, unspecified: Secondary | ICD-10-CM

## 2012-05-11 DIAGNOSIS — Z9089 Acquired absence of other organs: Secondary | ICD-10-CM | POA: Insufficient documentation

## 2012-05-11 DIAGNOSIS — J029 Acute pharyngitis, unspecified: Secondary | ICD-10-CM

## 2012-05-11 DIAGNOSIS — R109 Unspecified abdominal pain: Secondary | ICD-10-CM

## 2012-05-11 LAB — URINE MICROSCOPIC-ADD ON

## 2012-05-11 LAB — URINALYSIS, ROUTINE W REFLEX MICROSCOPIC
Bilirubin Urine: NEGATIVE
Leukocytes, UA: NEGATIVE
Nitrite: NEGATIVE
Specific Gravity, Urine: >1.03 — ABNORMAL HIGH (ref 1.005–1.030)
pH: 5.5 (ref 5.0–8.0)

## 2012-05-11 LAB — COMPREHENSIVE METABOLIC PANEL
ALT: 27 U/L (ref 0–35)
Alkaline Phosphatase: 94 U/L (ref 39–117)
CO2: 25 mEq/L (ref 19–32)
GFR calc Af Amer: >90 mL/min (ref >90)
GFR calc non Af Amer: >90 mL/min (ref >90)
Glucose, Bld: 92 mg/dL (ref 70–99)
Potassium: 4.4 mEq/L (ref 3.5–5.1)
Sodium: 137 mEq/L (ref 135–145)
Total Protein: 8.1 g/dL (ref 6.0–8.3)

## 2012-05-11 LAB — PREGNANCY, URINE: Preg Test, Ur: NEGATIVE

## 2012-05-11 LAB — CBC WITH DIFFERENTIAL/PLATELET
Lymphocytes Relative: 16 % (ref 12–46)
Lymphs Abs: 2.2 10*3/uL (ref 0.7–4.0)
Neutrophils Relative %: 74 % (ref 43–77)
Platelets: 323 10*3/uL (ref 150–400)
RBC: 4.37 MIL/uL (ref 3.87–5.11)
WBC: 13.4 10*3/uL — ABNORMAL HIGH (ref 4.0–10.5)

## 2012-05-11 LAB — LIPASE, BLOOD: Lipase: 31 U/L (ref 11–59)

## 2012-05-11 MED ORDER — AZITHROMYCIN 250 MG PO TABS: 250.0000 mg | ORAL_TABLET | Freq: Every day | ORAL | Status: AC

## 2012-05-11 MED ORDER — SODIUM CHLORIDE 0.9 % IV SOLN: INTRAVENOUS | Status: DC

## 2012-05-11 MED ORDER — HYDROMORPHONE HCL PF 1 MG/ML IJ SOLN
1.0000 mg | Freq: Once | INTRAMUSCULAR | Status: AC
  Administered 2012-05-11: 1 mg via INTRAVENOUS
  Filled 2012-05-11: qty 1

## 2012-05-11 MED ORDER — SODIUM CHLORIDE 0.9 % IV BOLUS (SEPSIS)
250.0000 mL | Freq: Once | INTRAVENOUS | Status: AC
  Administered 2012-05-11: 250 mL via INTRAVENOUS

## 2012-05-11 MED ORDER — ONDANSETRON HCL 4 MG/2ML IJ SOLN
4.0000 mg | Freq: Once | INTRAMUSCULAR | Status: AC
  Administered 2012-05-11: 4 mg via INTRAVENOUS
  Filled 2012-05-11: qty 2

## 2012-05-11 MED ORDER — PROMETHAZINE HCL 25 MG PO TABS: 25.0000 mg | ORAL_TABLET | Freq: Four times a day (QID) | ORAL | Status: AC | PRN

## 2012-05-11 MED ORDER — HYDROCODONE-ACETAMINOPHEN 5-325 MG PO TABS: 1.0000 | ORAL_TABLET | Freq: Four times a day (QID) | ORAL | Status: AC | PRN

## 2012-05-11 NOTE — ED Provider Notes (Signed)
History     CSN: [id number]  Arrival date & time 05/11/12  1750   First MD Initiated Contact with Patient 05/11/12 1818      Chief Complaint  Patient presents with  . Abdominal Pain    (Consider location/radiation/quality/duration/timing/severity/associated sxs/prior treatment) Patient is a 36 y.o. female presenting with abdominal pain. The history is provided by the patient and the spouse.  Abdominal Pain The primary symptoms of the illness include abdominal pain, nausea, vomiting and diarrhea. The primary symptoms of the illness do not include fever, shortness of breath, hematemesis or dysuria. The current episode started more than 2 days ago. The onset of the illness was sudden. The problem has been gradually worsening.  The abdominal pain began more than 2 days ago. The abdominal pain has been unchanged since its onset. The abdominal pain is located in the LLQ and RLQ. The abdominal pain does not radiate. The severity of the abdominal pain is 10/10. The abdominal pain is relieved by nothing.  Symptoms associated with the illness do not include chills or back pain.   Patient with several episodes of vomiting and diarrhea bilateral lower corner abdominal pain for the past 3 days. Patient has similar symptoms to this in the past is seen frequently for abdominal pain nausea and vomiting over the past year. Patient states she has followup with GI medicine in the next week. Patient was last seen July 20 and July 14. For similar symptoms.  Past Medical History  Diagnosis Date  . Asthma   . Migraine     Past Surgical History  Procedure Date  . Appendectomy   . Cholecystectomy   . Tonsillectomy   . Tubal ligation   . Brain surgery     History reviewed. No pertinent family history.  History  Substance Use Topics  . Smoking status: Current Everyday Smoker -- 0.5 packs/day for 15 years    Types: Cigarettes  . Smokeless tobacco: Never Used  . Alcohol Use: No    OB History    Grav Para Term Preterm Abortions TAB SAB Ect Mult Living                  Review of Systems  Constitutional: Negative for fever and chills.  HENT: Negative for congestion, sore throat and neck pain.   Eyes: Negative for redness and visual disturbance.  Respiratory: Negative for shortness of breath.   Cardiovascular: Negative for chest pain.  Gastrointestinal: Positive for nausea, vomiting, abdominal pain and diarrhea. Negative for hematemesis.  Genitourinary: Negative for dysuria.  Musculoskeletal: Negative for back pain.  Skin: Negative for rash.  Neurological: Negative for headaches.  Hematological: Does not bruise/bleed easily.    Allergies  Bee venom; Ketorolac tromethamine; Imitrex; and Isometheptene-apap-dichloral  Home Medications   Current Outpatient Rx  Name Route Sig Dispense Refill  . ACETAMINOPHEN 500 MG PO TABS Oral Take 500 mg by mouth every 6 (six) hours as needed. Pain    . ALBUTEROL SULFATE HFA 108 (90 BASE) MCG/ACT IN AERS Inhalation Inhale 2 puffs into the lungs every 6 (six) hours as needed. Shortness of breath    . IBUPROFEN 200 MG PO TABS Oral Take 800 mg by mouth every 6 (six) hours as needed. For pain    . PANTOPRAZOLE SODIUM 40 MG PO TBEC Oral Take 1 tablet (40 mg total) by mouth daily. 30 tablet 0  . HYDROCODONE-ACETAMINOPHEN 5-325 MG PO TABS Oral Take 1-2 tablets by mouth every 6 (six) hours as needed for pain.  12 tablet 0  . PROMETHAZINE HCL 25 MG PO TABS Oral Take 1 tablet (25 mg total) by mouth every 6 (six) hours as needed for nausea. 12 tablet 0    BP 130/78  Pulse 66  Temp 98.7 F (37.1 C) (Oral)  Resp 16  Ht 4\' 11"  (1.499 m)  Wt 160 lb (72.576 kg)  BMI 32.32 kg/m2  SpO2 100%  LMP 05/03/2012  Physical Exam  Nursing note and vitals reviewed. Constitutional: She is oriented to person, place, and time. She appears well-developed and well-nourished.  HENT:  Head: Normocephalic and atraumatic.  Mouth/Throat: Oropharynx is clear and moist.  No oropharyngeal exudate.  Eyes: Conjunctivae are normal. Pupils are equal, round, and reactive to light.  Neck: Normal range of motion. Neck supple.  Cardiovascular: Normal rate, regular rhythm and normal heart sounds.   No murmur heard. Pulmonary/Chest: Effort normal and breath sounds normal.  Abdominal: Soft. Bowel sounds are normal.       Mild tenderness no guarding bilateral lower cords.  Musculoskeletal: Normal range of motion. She exhibits no edema and no tenderness.  Neurological: She is alert and oriented to person, place, and time. No cranial nerve deficit. She exhibits normal muscle tone. Coordination normal.  Skin: Skin is warm. No rash noted.    ED Course  Procedures (including critical care time)  Labs Reviewed  URINALYSIS, ROUTINE W REFLEX MICROSCOPIC - Abnormal; Notable for the following:    Color, Urine AMBER (*)  BIOCHEMICALS MAY BE AFFECTED BY COLOR   Specific Gravity, Urine >1.030 (*)     Hgb urine dipstick MODERATE (*)     All other components within normal limits  CBC WITH DIFFERENTIAL - Abnormal; Notable for the following:    WBC 13.4 (*)     Neutro Abs 9.9 (*)     Monocytes Absolute 1.2 (*)     All other components within normal limits  COMPREHENSIVE METABOLIC PANEL - Abnormal; Notable for the following:    Total Bilirubin 0.2 (*)     All other components within normal limits  URINE MICROSCOPIC-ADD ON - Abnormal; Notable for the following:    Squamous Epithelial / LPF MANY (*)     Bacteria, UA FEW (*)     All other components within normal limits  PREGNANCY, URINE  LIPASE, BLOOD   Dg Abd Acute W/chest  05/11/2012  *RADIOLOGY REPORT*  Clinical Data: Abdominal pain  ACUTE ABDOMEN SERIES (ABDOMEN 2 VIEW & CHEST 1 VIEW)  Comparison: CT and 04/21/2012  Findings: Normal cardiac silhouette.  Lungs are clear.  No free air beneath hemidiaphragms.  There are several short air fluid levels in the upper mid abdomen.  No dilated loops of large or small bowel.  There is  gas within the rectum.  Prior cholecystectomy noted.  No pathologic calcifications.  No osseous abnormality.  IMPRESSION:  1.  Several short air fluid levels in the mid upper abdomen. This is a nonspecific finding.   Recommend correlation with pancreatitis. 2.  No evidence of bowel obstruction intraperitoneal free air.  2.   Lungs are clear.  4.  Cholecystectomy.  Original Report Authenticated By: Genevive Bi, M.D.   Results for orders placed during the hospital encounter of 05/11/12  URINALYSIS, ROUTINE W REFLEX MICROSCOPIC      Component Value Range   Color, Urine AMBER (*) YELLOW   APPearance CLEAR  CLEAR   Specific Gravity, Urine >1.030 (*) 1.005 - 1.030   pH 5.5  5.0 - 8.0  Glucose, UA NEGATIVE  NEGATIVE mg/dL   Hgb urine dipstick MODERATE (*) NEGATIVE   Bilirubin Urine NEGATIVE  NEGATIVE   Ketones, ur NEGATIVE  NEGATIVE mg/dL   Protein, ur NEGATIVE  NEGATIVE mg/dL   Urobilinogen, UA 0.2  0.0 - 1.0 mg/dL   Nitrite NEGATIVE  NEGATIVE   Leukocytes, UA NEGATIVE  NEGATIVE  PREGNANCY, URINE      Component Value Range   Preg Test, Ur NEGATIVE  NEGATIVE  CBC WITH DIFFERENTIAL      Component Value Range   WBC 13.4 (*) 4.0 - 10.5 K/uL   RBC 4.37  3.87 - 5.11 MIL/uL   Hemoglobin 14.0  12.0 - 15.0 g/dL   HCT 16.1  09.6 - 04.5 %   MCV 97.0  78.0 - 100.0 fL   MCH 32.0  26.0 - 34.0 pg   MCHC 33.0  30.0 - 36.0 g/dL   RDW 40.9  81.1 - 91.4 %   Platelets 323  150 - 400 K/uL   Neutrophils Relative 74  43 - 77 %   Neutro Abs 9.9 (*) 1.7 - 7.7 K/uL   Lymphocytes Relative 16  12 - 46 %   Lymphs Abs 2.2  0.7 - 4.0 K/uL   Monocytes Relative 9  3 - 12 %   Monocytes Absolute 1.2 (*) 0.1 - 1.0 K/uL   Eosinophils Relative 1  0 - 5 %   Eosinophils Absolute 0.1  0.0 - 0.7 K/uL   Basophils Relative 0  0 - 1 %   Basophils Absolute 0.0  0.0 - 0.1 K/uL  COMPREHENSIVE METABOLIC PANEL      Component Value Range   Sodium 137  135 - 145 mEq/L   Potassium 4.4  3.5 - 5.1 mEq/L   Chloride 101  96 -  112 mEq/L   CO2 25  19 - 32 mEq/L   Glucose, Bld 92  70 - 99 mg/dL   BUN 10  6 - 23 mg/dL   Creatinine, Ser 7.82  0.50 - 1.10 mg/dL   Calcium 95.6  8.4 - 21.3 mg/dL   Total Protein 8.1  6.0 - 8.3 g/dL   Albumin 4.2  3.5 - 5.2 g/dL   AST 19  0 - 37 U/L   ALT 27  0 - 35 U/L   Alkaline Phosphatase 94  39 - 117 U/L   Total Bilirubin 0.2 (*) 0.3 - 1.2 mg/dL   GFR calc non Af Amer >90  >90 mL/min   GFR calc Af Amer >90  >90 mL/min  LIPASE, BLOOD      Component Value Range   Lipase 31  11 - 59 U/L  URINE MICROSCOPIC-ADD ON      Component Value Range   Squamous Epithelial / LPF MANY (*) RARE   WBC, UA 0-2  <3 WBC/hpf   RBC / HPF 7-10  <3 RBC/hpf   Bacteria, UA FEW (*) RARE     1. Chronic abdominal pain   2. Abdominal pain   3. Vomiting and diarrhea       MDM  Patient known to me has been seen frequently for abdominal pain nausea or vomiting type symptoms. To the point that this would be consistent with chronic abdominal pain. Also suspicious that it may be mycotic withdrawal. Today's workup without evidence of pancreatitis no significant LFT abnormalities or electrolyte abnormalities urinalysis is contaminated not consistent with urinary tract infection mild leukocytosis no anemia pregnancy test was negative acute abdominal series shows increased  stool but no signs of bowel obstruction or ileus. Patient's abdomen without acute abdominal findings. Improved with pain medicine and antinausea medicine. Patient states she has followup with GI in the next 2 weeks. We'll send home with a small amount pain medicine and antinausea medicine.  Patient also with complaint of sore throat sore throat is intermittent but she's worried about having strep which she was Zithromax. She does have some sinus congestion suspect is most likely related to drainage no evidence of exudate or significant throat abnormality.       [name], MD 05/11/12 2123

## 2012-05-11 NOTE — ED Notes (Signed)
Low abd pain,voiding frequently, vomiting,diarrhea,  Sore throat. Sweating more than usual

## 2012-05-11 NOTE — ED Notes (Signed)
Patient does not need anything at this time. 

## 2012-05-11 NOTE — ED Notes (Signed)
Family at bedside. 

## 2012-05-11 NOTE — ED Notes (Signed)
Patient is resting comfortably. 

## 2012-05-11 NOTE — ED Notes (Signed)
Patient complaining of returning pain. States medication has worn off and pain has returned. Advised MD.

## 2012-05-16 ENCOUNTER — Telehealth: Payer: Self-pay | Admitting: Gastroenterology

## 2012-05-16 ENCOUNTER — Ambulatory Visit: Payer: Medicaid Other | Admitting: Gastroenterology

## 2012-05-16 NOTE — Telephone Encounter (Signed)
Pt was a no show

## 2012-05-24 ENCOUNTER — Encounter (HOSPITAL_COMMUNITY): Payer: Self-pay | Admitting: *Deleted

## 2012-05-24 ENCOUNTER — Emergency Department (HOSPITAL_COMMUNITY)
Admission: EM | Admit: 2012-05-24 | Discharge: 2012-05-24 | Disposition: A | Discharge status: Home / Self Care | Payer: Medicaid Other | Attending: Emergency Medicine | Admitting: Emergency Medicine

## 2012-05-24 ENCOUNTER — Telehealth: Payer: Self-pay | Admitting: Gastroenterology

## 2012-05-24 DIAGNOSIS — R109 Unspecified abdominal pain: Secondary | ICD-10-CM

## 2012-05-24 DIAGNOSIS — R1084 Generalized abdominal pain: Secondary | ICD-10-CM | POA: Insufficient documentation

## 2012-05-24 DIAGNOSIS — F17200 Nicotine dependence, unspecified, uncomplicated: Secondary | ICD-10-CM | POA: Insufficient documentation

## 2012-05-24 DIAGNOSIS — R112 Nausea with vomiting, unspecified: Secondary | ICD-10-CM | POA: Insufficient documentation

## 2012-05-24 DIAGNOSIS — R197 Diarrhea, unspecified: Secondary | ICD-10-CM | POA: Insufficient documentation

## 2012-05-24 DIAGNOSIS — G8929 Other chronic pain: Secondary | ICD-10-CM | POA: Insufficient documentation

## 2012-05-24 DIAGNOSIS — J45909 Unspecified asthma, uncomplicated: Secondary | ICD-10-CM | POA: Insufficient documentation

## 2012-05-24 DIAGNOSIS — Z9089 Acquired absence of other organs: Secondary | ICD-10-CM | POA: Insufficient documentation

## 2012-05-24 LAB — COMPREHENSIVE METABOLIC PANEL
ALT: 107 U/L — ABNORMAL HIGH (ref 0–35)
Alkaline Phosphatase: 184 U/L — ABNORMAL HIGH (ref 39–117)
BUN: 14 mg/dL (ref 6–23)
CO2: 20 mEq/L (ref 19–32)
Chloride: 100 mEq/L (ref 96–112)
GFR calc Af Amer: >90 mL/min (ref >90)
GFR calc non Af Amer: >90 mL/min (ref >90)
Glucose, Bld: 107 mg/dL — ABNORMAL HIGH (ref 70–99)
Potassium: 4 mEq/L (ref 3.5–5.1)
Total Bilirubin: 0.4 mg/dL (ref 0.3–1.2)
Total Protein: 8.9 g/dL — ABNORMAL HIGH (ref 6.0–8.3)

## 2012-05-24 LAB — LIPASE, BLOOD: Lipase: 20 U/L (ref 11–59)

## 2012-05-24 LAB — URINALYSIS, ROUTINE W REFLEX MICROSCOPIC
Nitrite: NEGATIVE
Specific Gravity, Urine: 1.01 (ref 1.005–1.030)
pH: 6 (ref 5.0–8.0)

## 2012-05-24 LAB — CBC WITH DIFFERENTIAL/PLATELET
Eosinophils Absolute: 0 10*3/uL (ref 0.0–0.7)
Hemoglobin: 15.3 g/dL — ABNORMAL HIGH (ref 12.0–15.0)
Lymphocytes Relative: 9 % — ABNORMAL LOW (ref 12–46)
Lymphs Abs: 1.6 10*3/uL (ref 0.7–4.0)
MCH: 34.1 pg — ABNORMAL HIGH (ref 26.0–34.0)
Monocytes Relative: 5 % (ref 3–12)
Neutrophils Relative %: 86 % — ABNORMAL HIGH (ref 43–77)
RBC: 4.49 MIL/uL (ref 3.87–5.11)
WBC: 17.2 10*3/uL — ABNORMAL HIGH (ref 4.0–10.5)

## 2012-05-24 LAB — PREGNANCY, URINE: Preg Test, Ur: NEGATIVE

## 2012-05-24 LAB — URINE MICROSCOPIC-ADD ON

## 2012-05-24 LAB — CLOSTRIDIUM DIFFICILE BY PCR: Toxigenic C. Difficile by PCR: NEGATIVE

## 2012-05-24 MED ORDER — MORPHINE SULFATE 4 MG/ML IJ SOLN
4.0000 mg | Freq: Once | INTRAMUSCULAR | Status: AC
  Administered 2012-05-24: 4 mg via INTRAVENOUS

## 2012-05-24 MED ORDER — HYOSCYAMINE SULFATE ER 0.375 MG PO TB12: 0.3750 mg | ORAL_TABLET | Freq: Two times a day (BID) | ORAL | Status: DC | PRN

## 2012-05-24 MED ORDER — MORPHINE SULFATE 4 MG/ML IJ SOLN: 4.0000 mg | Freq: Once | INTRAMUSCULAR | Status: DC

## 2012-05-24 MED ORDER — SODIUM CHLORIDE 0.9 % IV BOLUS (SEPSIS): 1000.0000 mL | Freq: Once | INTRAVENOUS | Status: DC

## 2012-05-24 MED ORDER — ONDANSETRON HCL 4 MG/2ML IJ SOLN
4.0000 mg | Freq: Once | INTRAMUSCULAR | Status: AC
  Administered 2012-05-24: 4 mg via INTRAVENOUS
  Filled 2012-05-24: qty 2

## 2012-05-24 MED ORDER — DICYCLOMINE HCL 10 MG/ML IM SOLN
20.0000 mg | Freq: Once | INTRAMUSCULAR | Status: AC
  Administered 2012-05-24: 20 mg via INTRAMUSCULAR
  Filled 2012-05-24: qty 2

## 2012-05-24 MED ORDER — MORPHINE SULFATE 4 MG/ML IJ SOLN
4.0000 mg | Freq: Once | INTRAMUSCULAR | Status: AC
  Administered 2012-05-24: 4 mg via INTRAVENOUS
  Filled 2012-05-24: qty 1

## 2012-05-24 MED ORDER — MORPHINE SULFATE 4 MG/ML IJ SOLN
INTRAMUSCULAR | Status: AC
  Filled 2012-05-24: qty 1

## 2012-05-24 MED ORDER — SODIUM CHLORIDE 0.9 % IV BOLUS (SEPSIS)
1000.0000 mL | Freq: Once | INTRAVENOUS | Status: AC
  Administered 2012-05-24 (×2): 1000 mL via INTRAVENOUS

## 2012-05-24 NOTE — ED Notes (Signed)
Pt requesting to See EMD. Raynelle Fanning, PA aware and EMD is to see patient as soon as he is available. Pt made aware.

## 2012-05-24 NOTE — ED Notes (Signed)
Pt states continued abdominal pain with N/V/D x 2 days. Pt states she has seen GI but unable to diagnose pt with anything. NAD.

## 2012-05-24 NOTE — ED Notes (Signed)
Pt c/o abdominal pain, nausea, vomiting and diarrhea x 2 days.

## 2012-05-24 NOTE — ED Notes (Signed)
Pt states pain has only decreased to 8. PA aware. No new orders at this time. Pt to be assessed by EMD, Dr. Ignacia Palma.

## 2012-05-24 NOTE — Progress Notes (Signed)
36 yo woman with abdominal pain that goes crossways across her upper abdomen. Exam showed no abdominal mass or tenderness.  Lab workup showed WBC elevated at 17,000, otherwise negative.  Review of old records showed multiple ED visits, with four negative abdominal/pelvic CT's in the past 13 months.  She had advised to have GI followup; review shows she missed her last two scheduled appointments.  I advised her that she had been seen on several occasions in the ED over the past year, without success in resolving her problem.  She needs to have workup by the GI specialist.  I called Gerrit Halls, NP for Dr. Karilyn Cota, [facility], and discussed pt's condition.  She advised Rx with LevBid, and gave her an appointment for a visit next Wednesday, August 28.

## 2012-05-24 NOTE — Telephone Encounter (Signed)
Pt in ED currently, no-showed several appts with our office. Spoke with Dr. Ignacia Palma. Start on Levbid Appt to be made by our office and given to Dr. Ignacia Palma so he may make pt aware.   Please make appt again for patient and call Dr. Ignacia Palma at [phone]

## 2012-05-28 LAB — STOOL CULTURE

## 2012-05-29 NOTE — ED Provider Notes (Signed)
Medical screening examination/treatment/procedure(s) were conducted as a shared visit with non-physician practitioner(s) and myself.  I personally evaluated the patient during the encounter 36 yo woman with abdominal pain that goes crossways across her upper abdomen. Exam showed no abdominal mass or tenderness. Lab workup showed WBC elevated at 17,000, otherwise negative. Review of old records showed multiple ED visits, with four negative abdominal/pelvic CT's in the past 13 months. She had advised to have GI followup; review shows she missed her last two scheduled appointments. I advised her that she had been seen on several occasions in the ED over the past year, without success in resolving her problem. She needs to have workup by the GI specialist. I called Gerrit Halls, NP for Dr. Karilyn Cota, [facility], and discussed pt's condition. She advised Rx with LevBid, and gave her an appointment for a visit next Wednesday, August 28.        Bonnie Cooper III, MD 05/29/12 [phone]

## 2012-05-29 NOTE — ED Provider Notes (Signed)
History     CSN: [id number]  Arrival date & time 05/24/12  0930   First MD Initiated Contact with Patient 05/24/12 [phone]      Chief Complaint  Patient presents with  . Abdominal Pain    (Consider location/radiation/quality/duration/timing/severity/associated sxs/prior treatment) HPI Comments: Bonnie Jensen presents with another episode of her chronic intermittent abdominal pain which is associated with non bloody emesis,  Watery brown diarrhea and fever which has been to 101 at home.  This episode started 2 days ago.  Her abdominal pain is generalized to her left upper and lower abdomen,  Waxing and waning,  But is not made better with emesis or vomiting.  Eating or drinking makes her pain worse.   She has found no alleviators for her pain,  But narcotics given during previous visits here have been a temporizing measure.   She does report having a history of c dificile about 2 years ago which was diagnosed at [facility] ed.  Her symptoms are similar to this previous infection.  Prior visits include:   04/11/2012 by Dr. Vanetta Mulders and Eber Hong, for  abdominal pain with associated nausea, vomiting, diarrhea, fever and urinary frequency. Patient reported a home temperature measurement of 103. Pt was given Zofran and fluids and was discharged home with a prescription for 10-4mg  tablets of Zofran after showing improvement in the ED. Patient was seen again on 04/14/12 by Dr. Bethann Berkshire complaining of the same abdominal pain. Patient had a urinalysis, urine culture, CBC, metabolic panel and CT of abdomen taken on 04/15/12. All tests were unremarkable for acute findings. Patient was treated for a UTI, but urine culture was negative for infection. Patient was also given anti nausea medication and Norco for pain treatment. She was seen here for the same symptoms on 8/9 with a negative workup,  And her last visit in July also included an abdominal/pelvic CT scan which was negative.  She was  referred to Dr. Jena Gauss of GI, but unfortunately missed 2 scheduled visits to his office this month for various reasons.    The history is provided by the patient and a relative.    Past Medical History  Diagnosis Date  . Asthma   . Migraine     Past Surgical History  Procedure Date  . Appendectomy   . Cholecystectomy   . Tonsillectomy   . Tubal ligation   . Brain surgery     History reviewed. No pertinent family history.  History  Substance Use Topics  . Smoking status: Current Everyday Smoker -- 0.5 packs/day for 15 years    Types: Cigarettes  . Smokeless tobacco: Never Used  . Alcohol Use: No    OB History    Grav Para Term Preterm Abortions TAB SAB Ect Mult Living                  Review of Systems  Constitutional: Positive for fever.  HENT: Negative for congestion, sore throat and neck pain.   Eyes: Negative.   Respiratory: Negative for chest tightness and shortness of breath.   Cardiovascular: Negative for chest pain.  Gastrointestinal: Positive for nausea, vomiting, abdominal pain and diarrhea. Negative for rectal pain.  Genitourinary: Negative.   Musculoskeletal: Negative for joint swelling and arthralgias.  Skin: Negative.  Negative for rash and wound.  Neurological: Negative for dizziness, weakness, light-headedness, numbness and headaches.  Hematological: Negative.   Psychiatric/Behavioral: Negative.     Allergies  Bee venom; Ketorolac tromethamine; Imitrex; and Isometheptene-apap-dichloral  Home Medications   Current Outpatient Rx  Name Route Sig Dispense Refill  . ACETAMINOPHEN 500 MG PO TABS Oral Take 500 mg by mouth every 6 (six) hours as needed. Pain    . IBUPROFEN 200 MG PO TABS Oral Take 800 mg by mouth every 6 (six) hours as needed. For pain    . HYOSCYAMINE SULFATE ER 0.375 MG PO TB12 Oral Take 1 tablet (0.375 mg total) by mouth every 12 (twelve) hours as needed for cramping. 30 tablet 0    BP 134/89  Pulse 119  Temp 98 F (36.7 C)  (Oral)  Resp 18  Ht 4\' 11"  (1.499 m)  Wt 160 lb (72.576 kg)  BMI 32.32 kg/m2  SpO2 99%  LMP 05/03/2012  Physical Exam  Nursing note and vitals reviewed. Constitutional: She appears well-developed and well-nourished.  HENT:  Head: Normocephalic and atraumatic.  Eyes: Conjunctivae are normal.  Neck: Normal range of motion.  Cardiovascular: Normal rate, regular rhythm, normal heart sounds and intact distal pulses.   Pulmonary/Chest: Effort normal and breath sounds normal. She has no wheezes.  Abdominal: Soft. Bowel sounds are normal. She exhibits no distension and no mass. There is no hepatosplenomegaly. There is tenderness in the left upper quadrant and left lower quadrant. There is no rebound, no guarding and no CVA tenderness. No hernia.  Musculoskeletal: Normal range of motion.  Neurological: She is alert.  Skin: Skin is warm and dry.  Psychiatric: She has a normal mood and affect.    ED Course  Procedures (including critical care time)  Labs Reviewed  CBC WITH DIFFERENTIAL - Abnormal; Notable for the following:    WBC 17.2 (*)     Hemoglobin 15.3 (*)     MCH 34.1 (*)     Platelets 423 (*)     Neutrophils Relative 86 (*)     Neutro Abs 14.7 (*)     Lymphocytes Relative 9 (*)     All other components within normal limits  COMPREHENSIVE METABOLIC PANEL - Abnormal; Notable for the following:    Glucose, Bld 107 (*)     Total Protein 8.9 (*)     ALT 107 (*)     Alkaline Phosphatase 184 (*)     All other components within normal limits  URINALYSIS, ROUTINE W REFLEX MICROSCOPIC - Abnormal; Notable for the following:    Hgb urine dipstick SMALL (*)     Bilirubin Urine SMALL (*)     Ketones, ur 15 (*)     Protein, ur TRACE (*)     All other components within normal limits  URINE MICROSCOPIC-ADD ON - Abnormal; Notable for the following:    Squamous Epithelial / LPF FEW (*)     Bacteria, UA FEW (*)     All other components within normal limits  LIPASE, BLOOD  PREGNANCY,  URINE  STOOL CULTURE  CLOSTRIDIUM DIFFICILE BY PCR  LAB REPORT - SCANNED   No results found.   1. Abdominal pain       MDM  Labs reviewed,  Wbc has been consistently elevated on previous ed visits.  Stool cx and c dif PCR pending.  Pt with acute on chronic abdominal pain which seems improved with narcotic medications,  Possibly sx due to narcotic withdrawal.  Pt has been reluctant to pursue GI workup.  She was strongly encouraged to f/u with this as there is not much more to be offered by the ed,  Although pt understands need to return for worsened  or new sx.  She was prescribed hyoscyamine as bentyl in the ed did give mild improvement,  Although pt obtained complete, but short term relief with narcotics per IV given here.  She was given IV fluids.  Also tolerated po fluids prior to dc home.  The patient appears reasonably screened and/or stabilized for discharge and I doubt any other medical condition or other [facility] requiring further screening, evaluation, or treatment in the ED at this time prior to discharge.   Pt was seen by Dr. Ignacia Palma prior to dc home.        Burgess Amor, [place] 05/29/12 1342

## 2012-05-30 ENCOUNTER — Ambulatory Visit: Payer: Medicaid Other | Admitting: Gastroenterology

## 2012-05-31 ENCOUNTER — Ambulatory Visit: Payer: Medicaid Other | Admitting: Gastroenterology

## 2012-05-31 ENCOUNTER — Telehealth: Payer: Self-pay | Admitting: Gastroenterology

## 2012-05-31 NOTE — Telephone Encounter (Signed)
Pt was a no show

## 2012-06-02 NOTE — Telephone Encounter (Signed)
Second no show

## 2012-06-02 NOTE — Telephone Encounter (Signed)
Third no-show.  I personally spoke with Dr. Ignacia Palma, ED physician at [facility] regarding this pt. He contacted our office stating pt presented to the ED regularly, and he asked for an appt to give pt that day prior to discharging. This was done, yet pt has again no-showed.   Please send a letter to patient explaining our policy on no-shows. I am copying Dr. Jena Gauss on this for further recommendations.

## 2012-06-03 NOTE — Telephone Encounter (Signed)
If we don't have a Dr.-pt relationship with him as it is, I want to block this individual from ever being scheduled here again

## 2012-06-05 NOTE — Telephone Encounter (Signed)
I blocked the patient in Epic.

## 2012-06-05 NOTE — Telephone Encounter (Signed)
Noted. I am copying Dr. Ignacia Palma on this for FYI.

## 2012-09-04 ENCOUNTER — Emergency Department (HOSPITAL_COMMUNITY): Payer: Medicaid Other

## 2012-09-04 ENCOUNTER — Emergency Department (HOSPITAL_COMMUNITY)
Admission: EM | Admit: 2012-09-04 | Discharge: 2012-09-04 | Disposition: A | Discharge status: Home / Self Care | Payer: Medicaid Other | Attending: Emergency Medicine | Admitting: Emergency Medicine

## 2012-09-04 ENCOUNTER — Encounter (HOSPITAL_COMMUNITY): Payer: Self-pay | Admitting: *Deleted

## 2012-09-04 DIAGNOSIS — Z9889 Other specified postprocedural states: Secondary | ICD-10-CM | POA: Insufficient documentation

## 2012-09-04 DIAGNOSIS — J40 Bronchitis, not specified as acute or chronic: Secondary | ICD-10-CM

## 2012-09-04 DIAGNOSIS — J029 Acute pharyngitis, unspecified: Secondary | ICD-10-CM | POA: Insufficient documentation

## 2012-09-04 DIAGNOSIS — IMO0001 Reserved for inherently not codable concepts without codable children: Secondary | ICD-10-CM | POA: Insufficient documentation

## 2012-09-04 DIAGNOSIS — Z8669 Personal history of other diseases of the nervous system and sense organs: Secondary | ICD-10-CM | POA: Insufficient documentation

## 2012-09-04 DIAGNOSIS — J329 Chronic sinusitis, unspecified: Secondary | ICD-10-CM | POA: Insufficient documentation

## 2012-09-04 DIAGNOSIS — F17200 Nicotine dependence, unspecified, uncomplicated: Secondary | ICD-10-CM | POA: Insufficient documentation

## 2012-09-04 DIAGNOSIS — R0602 Shortness of breath: Secondary | ICD-10-CM | POA: Insufficient documentation

## 2012-09-04 DIAGNOSIS — J3489 Other specified disorders of nose and nasal sinuses: Secondary | ICD-10-CM | POA: Insufficient documentation

## 2012-09-04 DIAGNOSIS — H9209 Otalgia, unspecified ear: Secondary | ICD-10-CM | POA: Insufficient documentation

## 2012-09-04 MED ORDER — AMOXICILLIN 500 MG PO CAPS: 500.0000 mg | ORAL_CAPSULE | Freq: Three times a day (TID) | ORAL | Status: DC

## 2012-09-04 MED ORDER — PREDNISONE 50 MG PO TABS
60.0000 mg | ORAL_TABLET | Freq: Once | ORAL | Status: AC
  Administered 2012-09-04: 60 mg via ORAL
  Filled 2012-09-04: qty 1

## 2012-09-04 MED ORDER — ALBUTEROL SULFATE (5 MG/ML) 0.5% IN NEBU
5.0000 mg | INHALATION_SOLUTION | Freq: Once | RESPIRATORY_TRACT | Status: AC
Start: 2012-09-04 — End: 2012-09-04
  Administered 2012-09-04: 5 mg via RESPIRATORY_TRACT
  Filled 2012-09-04: qty 1

## 2012-09-04 MED ORDER — OXYCODONE-ACETAMINOPHEN 5-325 MG PO TABS
1.0000 | ORAL_TABLET | Freq: Once | ORAL | Status: AC
  Administered 2012-09-04: 1 via ORAL
  Filled 2012-09-04: qty 1

## 2012-09-04 MED ORDER — TRAMADOL HCL 50 MG PO TABS: 50.0000 mg | ORAL_TABLET | Freq: Four times a day (QID) | ORAL | Status: DC | PRN

## 2012-09-04 MED ORDER — ALBUTEROL SULFATE (5 MG/ML) 0.5% IN NEBU: 2.5000 mg | INHALATION_SOLUTION | Freq: Four times a day (QID) | RESPIRATORY_TRACT | Status: DC | PRN

## 2012-09-04 MED ORDER — IPRATROPIUM BROMIDE 0.02 % IN SOLN
0.5000 mg | Freq: Once | RESPIRATORY_TRACT | Status: AC
  Administered 2012-09-04: 0.5 mg via RESPIRATORY_TRACT
  Filled 2012-09-04: qty 2.5

## 2012-09-04 MED ORDER — PREDNISONE 10 MG PO TABS: 20.0000 mg | ORAL_TABLET | Freq: Every day | ORAL | Status: DC

## 2012-09-04 NOTE — ED Notes (Signed)
C/o productive cough (green-yellow sputum), nasal congestion and bilateral ear pain x 2 months.  States was seen at Urgent Care and Rx Z-pak 1 month ago with no improvement of symptoms. C/o chest pain, back pain - worse with movement, cough and deep breaths- x 2 days.

## 2012-09-04 NOTE — ED Provider Notes (Signed)
History   This chart was scribed for [name], MD by Leone Payor, ED Scribe. This patient was seen in room APA14/APA14 and the patient's care was started at 1417.   CSN: [id number]  Arrival date & time 09/04/12  1356   First MD Initiated Contact with Patient 09/04/12 1417      Chief Complaint  Patient presents with  . Nasal Congestion  . Otalgia  . Cough     HPI Comments: Bonnie Jensen is a 36 y.o. female who presents to the Emergency Department complaining of productive cough with green-yellow sputum starting a few days ago with associated sore throat, earache, chest congestion (starting 2 months ago). Pt states she uses albuterol for the wheezing.   Pt does not have a PCP.  Pt is a current everyday smoker but denies alcohol use.   Patient is a 36 y.o. female presenting with cough. The history is provided by the patient. No language interpreter was used.  Cough This is a new problem. The current episode started 2 days ago. The problem occurs constantly. The problem has not changed since onset.The cough is productive of sputum. There has been no fever. Associated symptoms include ear pain, sore throat, myalgias, shortness of breath and wheezing. Pertinent negatives include no chest pain. She has tried nothing for the symptoms. She is a smoker. Her past medical history is significant for asthma.    Bonnie Jensen is a 36 y.o. female who presents to the Emergency Department complaining of productive cough with green-yellow sputum starting a few days ago with associated sore throat, earache, chest congestion (starting 2 months ago). Pt states she uses albuterol for the wheezing.   Pt does not have a PCP.  Pt is a current everyday smoker but denies alcohol use. Past Medical History  Diagnosis Date  . Asthma   . Migraine     Past Surgical History  Procedure Date  . Appendectomy   . Cholecystectomy   . Tonsillectomy   . Tubal ligation   . Brain surgery     No family  history on file.  History  Substance Use Topics  . Smoking status: Current Every Day Smoker -- 0.5 packs/day for 15 years    Types: Cigarettes  . Smokeless tobacco: Never Used  . Alcohol Use: No    OB History    Grav Para Term Preterm Abortions TAB SAB Ect Mult Living                  Review of Systems  Constitutional: Negative for fatigue.  HENT: Positive for ear pain and sore throat. Negative for congestion and sinus pressure.   Eyes: Negative for discharge.  Respiratory: Positive for cough, shortness of breath and wheezing.   Cardiovascular: Negative for chest pain.  Genitourinary: Negative for frequency and hematuria.  Musculoskeletal: Positive for myalgias. Negative for back pain.  Neurological: Negative for seizures.  Hematological: Negative.   Psychiatric/Behavioral: Negative for hallucinations.    Allergies  Bee venom; Ketorolac tromethamine; Imitrex; and Isometheptene-apap-dichloral  Home Medications   Current Outpatient Rx  Name  Route  Sig  Dispense  Refill  . ACETAMINOPHEN 500 MG PO TABS   Oral   Take 500 mg by mouth every 6 (six) hours as needed. Pain         . HYOSCYAMINE SULFATE ER 0.375 MG PO TB12   Oral   Take 1 tablet (0.375 mg total) by mouth every 12 (twelve) hours as needed for cramping.  30 tablet   0   . IBUPROFEN 200 MG PO TABS   Oral   Take 800 mg by mouth every 6 (six) hours as needed. For pain           BP 143/109  Pulse 114  Temp 97.6 F (36.4 C) (Oral)  Resp 22  Ht 4\' 11"  (1.499 m)  Wt 160 lb (72.576 kg)  BMI 32.32 kg/m2  SpO2 97%  Physical Exam  Nursing note and vitals reviewed. Constitutional: She is oriented to person, place, and time. She appears well-developed.  HENT:  Head: Normocephalic and atraumatic.  Eyes: Conjunctivae normal and EOM are normal. No scleral icterus.  Neck: Normal range of motion. Neck supple. No thyromegaly present.  Cardiovascular: Normal rate and regular rhythm.  Exam reveals no gallop  and no friction rub.   No murmur heard. Pulmonary/Chest: Effort normal. No stridor. She has wheezes. She has no rales. She exhibits no tenderness.       Moderate wheezing in lungs bilaterally.  Abdominal: Soft. Bowel sounds are normal. She exhibits no distension. There is no tenderness. There is no rebound.  Musculoskeletal: Normal range of motion. She exhibits no edema.  Lymphadenopathy:    She has no cervical adenopathy.  Neurological: She is oriented to person, place, and time. Coordination normal.  Skin: No rash noted. No erythema.  Psychiatric: She has a normal mood and affect. Her behavior is normal.    ED Course  Procedures (including critical care time)  DIAGNOSTIC STUDIES: Oxygen Saturation is 97% on room air, normal by my interpretation.    COORDINATION OF CARE:  2:28 PM Discussed treatment plan which includes breathing treatment with pt at bedside and pt agreed to plan.  3:35 PM Upon recheck, lungs are clearer although pt reports still having pain in chest and back. Will prescribe prednisone.    Labs Reviewed - No data to display Dg Chest Portable 1 View  09/04/2012  *RADIOLOGY REPORT*  Clinical Data: Cough.  PORTABLE CHEST - 1 VIEW  Comparison: 01/12/2012  Findings: Heart and mediastinal contours are within normal limits. No focal opacities or effusions.  No acute bony abnormality.  IMPRESSION: No active cardiopulmonary disease.   Original Report Authenticated By: Charlett Nose, M.D.      No diagnosis found.  Pt improved with tx  MDM     The chart was scribed for me under my direct supervision.  I personally performed the history, physical, and medical decision making and all procedures in the evaluation of this patient.[name], MD 09/04/12 [phone]

## 2012-09-04 NOTE — ED Notes (Signed)
Instructions, prescriptions and f/u information reviewed - verbalizes understanding. Alert, in no distress.

## 2012-09-09 ENCOUNTER — Emergency Department (HOSPITAL_COMMUNITY)
Admission: EM | Admit: 2012-09-09 | Discharge: 2012-09-09 | Disposition: A | Discharge status: Home / Self Care | Payer: Medicaid Other | Attending: Emergency Medicine | Admitting: Emergency Medicine

## 2012-09-09 ENCOUNTER — Encounter (HOSPITAL_COMMUNITY): Payer: Self-pay | Admitting: Emergency Medicine

## 2012-09-09 ENCOUNTER — Emergency Department (HOSPITAL_COMMUNITY): Payer: Medicaid Other

## 2012-09-09 DIAGNOSIS — F17200 Nicotine dependence, unspecified, uncomplicated: Secondary | ICD-10-CM | POA: Insufficient documentation

## 2012-09-09 DIAGNOSIS — J45909 Unspecified asthma, uncomplicated: Secondary | ICD-10-CM | POA: Insufficient documentation

## 2012-09-09 DIAGNOSIS — J40 Bronchitis, not specified as acute or chronic: Secondary | ICD-10-CM

## 2012-09-09 DIAGNOSIS — Z79899 Other long term (current) drug therapy: Secondary | ICD-10-CM | POA: Insufficient documentation

## 2012-09-09 DIAGNOSIS — Z792 Long term (current) use of antibiotics: Secondary | ICD-10-CM | POA: Insufficient documentation

## 2012-09-09 DIAGNOSIS — J9801 Acute bronchospasm: Secondary | ICD-10-CM

## 2012-09-09 DIAGNOSIS — G43909 Migraine, unspecified, not intractable, without status migrainosus: Secondary | ICD-10-CM | POA: Insufficient documentation

## 2012-09-09 DIAGNOSIS — R062 Wheezing: Secondary | ICD-10-CM | POA: Insufficient documentation

## 2012-09-09 MED ORDER — OXYCODONE-ACETAMINOPHEN 5-325 MG PO TABS
1.0000 | ORAL_TABLET | Freq: Once | ORAL | Status: AC
  Administered 2012-09-09: 1 via ORAL
  Filled 2012-09-09: qty 1

## 2012-09-09 MED ORDER — IPRATROPIUM BROMIDE HFA 17 MCG/ACT IN AERS: 2.0000 | INHALATION_SPRAY | Freq: Four times a day (QID) | RESPIRATORY_TRACT | Status: DC

## 2012-09-09 NOTE — ED Provider Notes (Signed)
History     CSN: [id number]  Arrival date & time 09/09/12  4098   First MD Initiated Contact with Patient 09/09/12 [phone]      Chief Complaint  Patient presents with  . Shortness of Breath    (Consider location/radiation/quality/duration/timing/severity/associated sxs/prior treatment) Patient is a 36 y.o. female presenting with shortness of breath. The history is provided by the patient (the pt complains of sob and wheezing.  pt given tx by ems and is better). No language interpreter was used.  Shortness of Breath  The current episode started today. The problem occurs occasionally. The problem has been resolved. The problem is moderate. Nothing relieves the symptoms. Nothing aggravates the symptoms. Associated symptoms include shortness of breath and wheezing. Pertinent negatives include no chest pain and no cough.    Past Medical History  Diagnosis Date  . Asthma   . Migraine     Past Surgical History  Procedure Date  . Appendectomy   . Cholecystectomy   . Tonsillectomy   . Tubal ligation   . Brain surgery     No family history on file.  History  Substance Use Topics  . Smoking status: Current Every Day Smoker -- 1.0 packs/day for 15 years    Types: Cigarettes  . Smokeless tobacco: Never Used  . Alcohol Use: No    OB History    Grav Para Term Preterm Abortions TAB SAB Ect Mult Living                  Review of Systems  Constitutional: Negative for fatigue.  HENT: Negative for congestion, sinus pressure and ear discharge.   Eyes: Negative for discharge.  Respiratory: Positive for shortness of breath and wheezing. Negative for cough.   Cardiovascular: Negative for chest pain.  Gastrointestinal: Negative for abdominal pain and diarrhea.  Genitourinary: Negative for frequency and hematuria.  Musculoskeletal: Negative for back pain.  Skin: Negative for rash.  Neurological: Negative for seizures and headaches.  Hematological: Negative.   Psychiatric/Behavioral:  Negative for hallucinations.    Allergies  Bee venom; Ketorolac tromethamine; Imitrex; and Isometheptene-apap-dichloral  Home Medications   Current Outpatient Rx  Name  Route  Sig  Dispense  Refill  . ACETAMINOPHEN 500 MG PO TABS   Oral   Take 1,000 mg by mouth every 6 (six) hours as needed. Pain and fever reducer         . ALBUTEROL SULFATE HFA 108 (90 BASE) MCG/ACT IN AERS   Inhalation   Inhale 2 puffs into the lungs every 4 (four) hours as needed. Shortness of breath         . ALBUTEROL SULFATE (5 MG/ML) 0.5% IN NEBU   Nebulization   Take 0.5 mLs (2.5 mg total) by nebulization every 6 (six) hours as needed for wheezing.   20 mL   12   . AMOXICILLIN 500 MG PO CAPS   Oral   Take 1 capsule (500 mg total) by mouth 3 (three) times daily.   21 capsule   0   . IBUPROFEN 200 MG PO TABS   Oral   Take 800 mg by mouth every 6 (six) hours as needed. For pain         . IPRATROPIUM BROMIDE HFA 17 MCG/ACT IN AERS   Inhalation   Inhale 2 puffs into the lungs every 6 (six) hours.   1 Inhaler   12   . PREDNISONE 10 MG PO TABS   Oral   Take 2 tablets (20  mg total) by mouth daily.   15 tablet   0   . TRAMADOL HCL 50 MG PO TABS   Oral   Take 1 tablet (50 mg total) by mouth every 6 (six) hours as needed for pain.   20 tablet   0     BP 123/71  Pulse 105  Temp 97.6 F (36.4 C)  Resp 18  SpO2 96%  LMP 08/26/2012  Physical Exam  Constitutional: She is oriented to person, place, and time. She appears well-developed.  HENT:  Head: Normocephalic and atraumatic.  Eyes: Conjunctivae normal and EOM are normal. No scleral icterus.  Neck: Neck supple. No thyromegaly present.  Cardiovascular: Normal rate and regular rhythm.  Exam reveals no gallop and no friction rub.   No murmur heard. Pulmonary/Chest: No stridor. She has no wheezes. She has no rales. She exhibits no tenderness.  Abdominal: She exhibits no distension. There is no tenderness. There is no rebound.   Musculoskeletal: Normal range of motion. She exhibits no edema.  Lymphadenopathy:    She has no cervical adenopathy.  Neurological: She is oriented to person, place, and time. Coordination normal.  Skin: No rash noted. No erythema.  Psychiatric: She has a normal mood and affect. Her behavior is normal.    ED Course  Procedures (including critical care time)  Labs Reviewed - No data to display Dg Chest 2 View  09/09/2012  *RADIOLOGY REPORT*  Clinical Data: Shortness of breath.  CHEST - 2 VIEW  Comparison: 09/04/2012  Findings: Heart and mediastinal contours are within normal limits. No focal opacities or effusions.  No acute bony abnormality.  IMPRESSION: No active cardiopulmonary disease.   Original Report Authenticated By: Charlett Nose, M.D.      1. Bronchitis   2. Bronchospasm       MDM          [name], MD 09/09/12 [phone]

## 2012-09-09 NOTE — ED Notes (Signed)
Solumedrol 125 mg IV, albuterol neb x1, duoneb x1 by EMS.

## 2012-09-09 NOTE — ED Notes (Signed)
States she has been having problems off and on for the past 2 months, worse over the past 2 days.  States that she was recently seen here in the ER for the same symptoms.  States she is not getting any better.

## 2012-09-09 NOTE — ED Notes (Addendum)
States she has been coughing up green phlegm until she vomits.  States she has generalized chest pains from coughing so much.  States she also has severe nasal congestion.  States she has been taking her medications as prescribed.

## 2012-09-12 ENCOUNTER — Encounter (HOSPITAL_COMMUNITY): Payer: Self-pay | Admitting: Emergency Medicine

## 2012-09-12 ENCOUNTER — Emergency Department (HOSPITAL_COMMUNITY)
Admission: EM | Admit: 2012-09-12 | Discharge: 2012-09-12 | Disposition: A | Discharge status: Home / Self Care | Payer: Medicaid Other | Attending: Emergency Medicine | Admitting: Emergency Medicine

## 2012-09-12 DIAGNOSIS — F17200 Nicotine dependence, unspecified, uncomplicated: Secondary | ICD-10-CM | POA: Insufficient documentation

## 2012-09-12 DIAGNOSIS — J40 Bronchitis, not specified as acute or chronic: Secondary | ICD-10-CM | POA: Insufficient documentation

## 2012-09-12 DIAGNOSIS — J329 Chronic sinusitis, unspecified: Secondary | ICD-10-CM | POA: Insufficient documentation

## 2012-09-12 DIAGNOSIS — Z791 Long term (current) use of non-steroidal anti-inflammatories (NSAID): Secondary | ICD-10-CM | POA: Insufficient documentation

## 2012-09-12 DIAGNOSIS — Z8679 Personal history of other diseases of the circulatory system: Secondary | ICD-10-CM | POA: Insufficient documentation

## 2012-09-12 DIAGNOSIS — R1110 Vomiting, unspecified: Secondary | ICD-10-CM | POA: Insufficient documentation

## 2012-09-12 DIAGNOSIS — R197 Diarrhea, unspecified: Secondary | ICD-10-CM | POA: Insufficient documentation

## 2012-09-12 DIAGNOSIS — Z79899 Other long term (current) drug therapy: Secondary | ICD-10-CM | POA: Insufficient documentation

## 2012-09-12 DIAGNOSIS — J45909 Unspecified asthma, uncomplicated: Secondary | ICD-10-CM | POA: Insufficient documentation

## 2012-09-12 DIAGNOSIS — IMO0001 Reserved for inherently not codable concepts without codable children: Secondary | ICD-10-CM | POA: Insufficient documentation

## 2012-09-12 LAB — CBC WITH DIFFERENTIAL/PLATELET
Basophils Absolute: 0 10*3/uL (ref 0.0–0.1)
Eosinophils Relative: 1 % (ref 0–5)
HCT: 42.5 % (ref 36.0–46.0)
Hemoglobin: 14.4 g/dL (ref 12.0–15.0)
Lymphocytes Relative: 4 % — ABNORMAL LOW (ref 12–46)
Lymphs Abs: 0.6 10*3/uL — ABNORMAL LOW (ref 0.7–4.0)
MCV: 101.2 fL — ABNORMAL HIGH (ref 78.0–100.0)
Monocytes Absolute: 0.4 10*3/uL (ref 0.1–1.0)
Monocytes Relative: 3 % (ref 3–12)
Neutro Abs: 12.1 10*3/uL — ABNORMAL HIGH (ref 1.7–7.7)
RDW: 14.6 % (ref 11.5–15.5)
WBC: 13.3 10*3/uL — ABNORMAL HIGH (ref 4.0–10.5)

## 2012-09-12 LAB — URINALYSIS, ROUTINE W REFLEX MICROSCOPIC
Hgb urine dipstick: NEGATIVE
Nitrite: NEGATIVE
Protein, ur: NEGATIVE mg/dL
Specific Gravity, Urine: <1.005 — ABNORMAL LOW (ref 1.005–1.030)
Urobilinogen, UA: 0.2 mg/dL (ref 0.0–1.0)

## 2012-09-12 LAB — COMPREHENSIVE METABOLIC PANEL
AST: 40 U/L — ABNORMAL HIGH (ref 0–37)
BUN: 12 mg/dL (ref 6–23)
CO2: 27 mEq/L (ref 19–32)
Calcium: 9.2 mg/dL (ref 8.4–10.5)
Chloride: 97 mEq/L (ref 96–112)
Creatinine, Ser: 0.6 mg/dL (ref 0.50–1.10)
GFR calc Af Amer: >90 mL/min (ref >90)
GFR calc non Af Amer: >90 mL/min (ref >90)
Glucose, Bld: 105 mg/dL — ABNORMAL HIGH (ref 70–99)
Total Bilirubin: 0.3 mg/dL (ref 0.3–1.2)

## 2012-09-12 MED ORDER — PROMETHAZINE HCL 25 MG PO TABS: 25.0000 mg | ORAL_TABLET | Freq: Four times a day (QID) | ORAL | Status: DC | PRN

## 2012-09-12 MED ORDER — HYDROMORPHONE HCL PF 1 MG/ML IJ SOLN
1.0000 mg | Freq: Once | INTRAMUSCULAR | Status: AC
  Administered 2012-09-12: 1 mg via INTRAVENOUS
  Filled 2012-09-12: qty 1

## 2012-09-12 MED ORDER — SULFAMETHOXAZOLE-TRIMETHOPRIM 800-160 MG PO TABS: 1.0000 | ORAL_TABLET | Freq: Two times a day (BID) | ORAL | Status: AC

## 2012-09-12 MED ORDER — TRAMADOL HCL 50 MG PO TABS: 50.0000 mg | ORAL_TABLET | Freq: Four times a day (QID) | ORAL | Status: DC | PRN

## 2012-09-12 MED ORDER — METHYLPREDNISOLONE SODIUM SUCC 125 MG IJ SOLR
125.0000 mg | Freq: Once | INTRAMUSCULAR | Status: AC
  Administered 2012-09-12: 125 mg via INTRAMUSCULAR

## 2012-09-12 MED ORDER — PREDNISONE 10 MG PO TABS: 20.0000 mg | ORAL_TABLET | Freq: Every day | ORAL | Status: DC

## 2012-09-12 MED ORDER — ONDANSETRON HCL 4 MG/2ML IJ SOLN
4.0000 mg | Freq: Once | INTRAMUSCULAR | Status: AC
  Administered 2012-09-12: 4 mg via INTRAVENOUS
  Filled 2012-09-12: qty 2

## 2012-09-12 MED ORDER — METHYLPREDNISOLONE SODIUM SUCC 125 MG IJ SOLR
125.0000 mg | Freq: Once | INTRAMUSCULAR | Status: DC
  Filled 2012-09-12: qty 2

## 2012-09-12 MED ORDER — SODIUM CHLORIDE 0.9 % IV BOLUS (SEPSIS)
1000.0000 mL | Freq: Once | INTRAVENOUS | Status: AC
  Administered 2012-09-12: 1000 mL via INTRAVENOUS

## 2012-09-12 MED ORDER — HYDROCODONE-ACETAMINOPHEN 5-325 MG PO TABS: 1.0000 | ORAL_TABLET | Freq: Four times a day (QID) | ORAL | Status: DC | PRN

## 2012-09-12 NOTE — ED Provider Notes (Signed)
History     CSN: [id number]  Arrival date & time 09/12/12  1119   First MD Initiated Contact with Patient 09/12/12 1309      Chief Complaint  Patient presents with  . Emesis  . Diarrhea  . Generalized Body Aches    (Consider location/radiation/quality/duration/timing/severity/associated sxs/prior treatment) Patient is a 36 y.o. female presenting with cough. The history is provided by the patient (the pt complains of a cough and sinus problems for weeks.  ). No language interpreter was used.  Cough This is a recurrent problem. The current episode started more than 1 week ago. The problem occurs constantly. The problem has not changed since onset.The cough is non-productive. There has been no fever. Associated symptoms include chills. Pertinent negatives include no chest pain and no headaches. The treatment provided no relief. Risk factors: smokes. She is a smoker. Her past medical history does not include pneumonia.    Past Medical History  Diagnosis Date  . Asthma   . Migraine     Past Surgical History  Procedure Date  . Appendectomy   . Cholecystectomy   . Tonsillectomy   . Tubal ligation   . Brain surgery     No family history on file.  History  Substance Use Topics  . Smoking status: Current Every Day Smoker -- 1.0 packs/day for 15 years    Types: Cigarettes  . Smokeless tobacco: Never Used  . Alcohol Use: No    OB History    Grav Para Term Preterm Abortions TAB SAB Ect Mult Living                  Review of Systems  Constitutional: Positive for chills. Negative for fatigue.  HENT: Negative for congestion, sinus pressure and ear discharge.   Eyes: Negative for discharge.  Respiratory: Positive for cough.   Cardiovascular: Negative for chest pain.  Gastrointestinal: Negative for abdominal pain and diarrhea.  Genitourinary: Negative for frequency and hematuria.  Musculoskeletal: Negative for back pain.  Skin: Negative for rash.  Neurological: Negative  for seizures and headaches.  Hematological: Negative.   Psychiatric/Behavioral: Negative for hallucinations.    Allergies  Bee venom; Ketorolac tromethamine; Imitrex; and Isometheptene-apap-dichloral  Home Medications   Current Outpatient Rx  Name  Route  Sig  Dispense  Refill  . ACETAMINOPHEN 500 MG PO TABS   Oral   Take 1,000 mg by mouth every 6 (six) hours as needed. Pain and fever reducer         . ALBUTEROL SULFATE HFA 108 (90 BASE) MCG/ACT IN AERS   Inhalation   Inhale 2 puffs into the lungs every 4 (four) hours as needed. Shortness of breath         . ALBUTEROL SULFATE (5 MG/ML) 0.5% IN NEBU   Nebulization   Take 0.5 mLs (2.5 mg total) by nebulization every 6 (six) hours as needed for wheezing.   20 mL   12   . PREDNISONE 10 MG PO TABS   Oral   Take 2 tablets (20 mg total) by mouth daily.   14 tablet   0   . PROMETHAZINE HCL 25 MG PO TABS   Oral   Take 1 tablet (25 mg total) by mouth every 6 (six) hours as needed for nausea.   15 tablet   0   . SULFAMETHOXAZOLE-TRIMETHOPRIM 800-160 MG PO TABS   Oral   Take 1 tablet by mouth 2 (two) times daily.   28 tablet   0  Dispense as written.   . TRAMADOL HCL 50 MG PO TABS   Oral   Take 1 tablet (50 mg total) by mouth every 6 (six) hours as needed for pain.   20 tablet   0     BP 145/88  Pulse 121  Temp 98.7 F (37.1 C)  Resp 20  Ht 4\' 11"  (1.499 m)  Wt 160 lb (72.576 kg)  BMI 32.32 kg/m2  SpO2 98%  LMP 08/26/2012  Physical Exam  Constitutional: She is oriented to person, place, and time. She appears well-developed.  HENT:  Head: Normocephalic and atraumatic.  Eyes: Conjunctivae normal and EOM are normal. No scleral icterus.  Neck: Neck supple. No thyromegaly present.  Cardiovascular: Normal rate and regular rhythm.  Exam reveals no gallop and no friction rub.   No murmur heard. Pulmonary/Chest: No stridor. She has no wheezes. She has no rales. She exhibits no tenderness.  Abdominal: She  exhibits no distension. There is no tenderness. There is no rebound.  Musculoskeletal: Normal range of motion. She exhibits no edema.  Lymphadenopathy:    She has no cervical adenopathy.  Neurological: She is oriented to person, place, and time. Coordination normal.  Skin: No rash noted. No erythema.  Psychiatric: She has a normal mood and affect. Her behavior is normal.    ED Course  Procedures (including critical care time)  Labs Reviewed  CBC WITH DIFFERENTIAL - Abnormal; Notable for the following:    WBC 13.3 (*)     MCV 101.2 (*)     MCH 34.3 (*)     Neutrophils Relative 92 (*)     Neutro Abs 12.1 (*)     Lymphocytes Relative 4 (*)     Lymphs Abs 0.6 (*)     All other components within normal limits  COMPREHENSIVE METABOLIC PANEL - Abnormal; Notable for the following:    Glucose, Bld 105 (*)     Albumin 3.4 (*)     AST 40 (*)     ALT 46 (*)     All other components within normal limits  URINALYSIS, ROUTINE W REFLEX MICROSCOPIC - Abnormal; Notable for the following:    Specific Gravity, Urine <1.005 (*)     All other components within normal limits   No results found.   1. Sinusitis   2. Bronchitis       MDM          [name], MD 09/12/12 [phone]

## 2012-09-12 NOTE — ED Notes (Signed)
Pt c/o/ cold sx x 3 months with n/v/d/body aches since yesterday.

## 2012-09-17 ENCOUNTER — Emergency Department (HOSPITAL_COMMUNITY): Payer: Medicaid Other

## 2012-09-17 ENCOUNTER — Encounter (HOSPITAL_COMMUNITY): Payer: Self-pay

## 2012-09-17 ENCOUNTER — Emergency Department (HOSPITAL_COMMUNITY)
Admission: EM | Admit: 2012-09-17 | Discharge: 2012-09-17 | Disposition: A | Discharge status: Home / Self Care | Payer: Medicaid Other | Attending: Emergency Medicine | Admitting: Emergency Medicine

## 2012-09-17 DIAGNOSIS — Z8679 Personal history of other diseases of the circulatory system: Secondary | ICD-10-CM | POA: Insufficient documentation

## 2012-09-17 DIAGNOSIS — R112 Nausea with vomiting, unspecified: Secondary | ICD-10-CM | POA: Insufficient documentation

## 2012-09-17 DIAGNOSIS — Z79899 Other long term (current) drug therapy: Secondary | ICD-10-CM | POA: Insufficient documentation

## 2012-09-17 DIAGNOSIS — R109 Unspecified abdominal pain: Secondary | ICD-10-CM

## 2012-09-17 DIAGNOSIS — J029 Acute pharyngitis, unspecified: Secondary | ICD-10-CM | POA: Insufficient documentation

## 2012-09-17 DIAGNOSIS — J329 Chronic sinusitis, unspecified: Secondary | ICD-10-CM | POA: Insufficient documentation

## 2012-09-17 DIAGNOSIS — J45909 Unspecified asthma, uncomplicated: Secondary | ICD-10-CM | POA: Insufficient documentation

## 2012-09-17 DIAGNOSIS — G8929 Other chronic pain: Secondary | ICD-10-CM | POA: Insufficient documentation

## 2012-09-17 DIAGNOSIS — R1084 Generalized abdominal pain: Secondary | ICD-10-CM | POA: Insufficient documentation

## 2012-09-17 DIAGNOSIS — F17200 Nicotine dependence, unspecified, uncomplicated: Secondary | ICD-10-CM | POA: Insufficient documentation

## 2012-09-17 HISTORY — DX: Other chronic pain: G89.29

## 2012-09-17 HISTORY — DX: Unspecified abdominal pain: R10.9

## 2012-09-17 HISTORY — DX: Dorsalgia, unspecified: M54.9

## 2012-09-17 LAB — COMPREHENSIVE METABOLIC PANEL
AST: 87 U/L — ABNORMAL HIGH (ref 0–37)
CO2: 26 mEq/L (ref 19–32)
Calcium: 9.4 mg/dL (ref 8.4–10.5)
Creatinine, Ser: 0.64 mg/dL (ref 0.50–1.10)
GFR calc Af Amer: >90 mL/min (ref >90)
GFR calc non Af Amer: >90 mL/min (ref >90)
Total Protein: 7.9 g/dL (ref 6.0–8.3)

## 2012-09-17 LAB — CBC WITH DIFFERENTIAL/PLATELET
Hemoglobin: 14.4 g/dL (ref 12.0–15.0)
Lymphs Abs: 1.9 10*3/uL (ref 0.7–4.0)
MCH: 33.9 pg (ref 26.0–34.0)
Monocytes Relative: 4 % (ref 3–12)
Neutro Abs: 15.5 10*3/uL — ABNORMAL HIGH (ref 1.7–7.7)
Neutrophils Relative %: 85 % — ABNORMAL HIGH (ref 43–77)
RBC: 4.25 MIL/uL (ref 3.87–5.11)

## 2012-09-17 LAB — URINALYSIS, ROUTINE W REFLEX MICROSCOPIC
Glucose, UA: NEGATIVE mg/dL
Leukocytes, UA: NEGATIVE
Specific Gravity, Urine: <1.005 — ABNORMAL LOW (ref 1.005–1.030)
pH: 6 (ref 5.0–8.0)

## 2012-09-17 LAB — LIPASE, BLOOD: Lipase: 52 U/L (ref 11–59)

## 2012-09-17 MED ORDER — FAMOTIDINE IN NACL 20-0.9 MG/50ML-% IV SOLN
20.0000 mg | Freq: Once | INTRAVENOUS | Status: AC
Start: 2012-09-17 — End: 2012-09-17
  Administered 2012-09-17: 20 mg via INTRAVENOUS
  Filled 2012-09-17: qty 50

## 2012-09-17 MED ORDER — IOHEXOL 300 MG/ML  SOLN
100.0000 mL | Freq: Once | INTRAMUSCULAR | Status: AC | PRN
  Administered 2012-09-17: 100 mL via INTRAVENOUS

## 2012-09-17 MED ORDER — ONDANSETRON HCL 4 MG/2ML IJ SOLN
4.0000 mg | Freq: Once | INTRAMUSCULAR | Status: AC
  Administered 2012-09-17: 4 mg via INTRAVENOUS
  Filled 2012-09-17: qty 2

## 2012-09-17 MED ORDER — ONDANSETRON HCL 4 MG PO TABS: 4.0000 mg | ORAL_TABLET | Freq: Four times a day (QID) | ORAL | Status: DC

## 2012-09-17 MED ORDER — SODIUM CHLORIDE 0.9 % IV BOLUS (SEPSIS)
1000.0000 mL | Freq: Once | INTRAVENOUS | Status: AC
  Administered 2012-09-17: 1000 mL via INTRAVENOUS

## 2012-09-17 MED ORDER — FENTANYL CITRATE 0.05 MG/ML IJ SOLN
50.0000 ug | INTRAMUSCULAR | Status: AC | PRN
  Administered 2012-09-17 (×2): 50 ug via INTRAVENOUS
  Filled 2012-09-17 (×2): qty 2

## 2012-09-17 MED ORDER — FENTANYL CITRATE 0.05 MG/ML IJ SOLN
100.0000 ug | Freq: Once | INTRAMUSCULAR | Status: AC
  Administered 2012-09-17: 100 ug via INTRAVENOUS
  Filled 2012-09-17: qty 2

## 2012-09-17 MED ORDER — SODIUM CHLORIDE 0.9 % IV SOLN
INTRAVENOUS | Status: DC
  Administered 2012-09-17: 11:00:00 via INTRAVENOUS

## 2012-09-17 MED ORDER — TRAMADOL HCL 50 MG PO TABS: 50.0000 mg | ORAL_TABLET | Freq: Four times a day (QID) | ORAL | Status: DC | PRN

## 2012-09-17 NOTE — ED Notes (Signed)
Pt given sprite with ice, sipping and tolerating well.

## 2012-09-17 NOTE — ED Notes (Signed)
Pt reports n/v and some abd pain for the past several weeks.  nad noted

## 2012-09-17 NOTE — ED Notes (Signed)
Pt requesting nausea and pain meds.  edp notified and orders received.

## 2012-09-17 NOTE — ED Provider Notes (Signed)
History     CSN: [id number]  Arrival date & time 09/17/12  7829   First MD Initiated Contact with Patient 09/17/12 [phone]      Chief Complaint  Patient presents with  . Nausea  . Emesis     HPI Pt was seen at 1030.   Per pt, c/o gradual onset and persistence of constant acute flair of her chronic generalized abd "pain" for the past several weeks.  Has been associated with several intermittent episodes of N/V.  Also c/o sinus congestion and sore throat for the past "3 months."  Denies fevers, no rash, no diarrhea, no black or blood in stools or emesis, no CP/SOB, no cough, no back pain.  The patient has a significant history of similar symptoms previously, recently being evaluated for this complaint and multiple prior evals for same.        Past Medical History  Diagnosis Date  . Asthma   . Migraine   . Chronic abdominal pain     Past Surgical History  Procedure Date  . Appendectomy   . Cholecystectomy   . Tonsillectomy   . Tubal ligation   . Brain surgery      History  Substance Use Topics  . Smoking status: Current Every Day Smoker -- 1.0 packs/day for 15 years    Types: Cigarettes  . Smokeless tobacco: Never Used  . Alcohol Use: No    Review of Systems ROS: Statement: All systems negative except as marked or noted in the HPI; Constitutional: Negative for fever and chills. ; ; Eyes: Negative for eye pain, redness and discharge. ; ; ENMT: Negative for ear pain, hoarseness, +nasal congestion, sinus pressure and sore throat. ; ; Cardiovascular: Negative for chest pain, palpitations, diaphoresis, dyspnea and peripheral edema. ; ; Respiratory: Negative for cough, wheezing and stridor. ; ; Gastrointestinal: +abd pain, N/V.  Negative for diarrhea, blood in stool, hematemesis, jaundice and rectal bleeding. . ; ; Genitourinary: Negative for dysuria, flank pain and hematuria. ; ; Musculoskeletal: Negative for back pain and neck pain. Negative for swelling and trauma.; ; Skin:  Negative for pruritus, rash, abrasions, blisters, bruising and skin lesion.; ; Neuro: Negative for headache, lightheadedness and neck stiffness. Negative for weakness, altered level of consciousness , altered mental status, extremity weakness, paresthesias, involuntary movement, seizure and syncope.       Allergies  Bee venom; Ketorolac tromethamine; Imitrex; and Isometheptene-apap-dichloral  Home Medications   Current Outpatient Rx  Name  Route  Sig  Dispense  Refill  . ALBUTEROL SULFATE HFA 108 (90 BASE) MCG/ACT IN AERS   Inhalation   Inhale 2 puffs into the lungs every 4 (four) hours as needed. Shortness of breath         . ALBUTEROL SULFATE (5 MG/ML) 0.5% IN NEBU   Nebulization   Take 0.5 mLs (2.5 mg total) by nebulization every 6 (six) hours as needed for wheezing.   20 mL   12   . HYDROCODONE-ACETAMINOPHEN 5-325 MG PO TABS   Oral   Take 1 tablet by mouth every 6 (six) hours as needed for pain.   20 tablet   0   . PREDNISONE 10 MG PO TABS   Oral   Take 2 tablets (20 mg total) by mouth daily.   14 tablet   0   . PROMETHAZINE HCL 25 MG PO TABS   Oral   Take 1 tablet (25 mg total) by mouth every 6 (six) hours as needed for nausea.  15 tablet   0   . SULFAMETHOXAZOLE-TRIMETHOPRIM 800-160 MG PO TABS   Oral   Take 1 tablet by mouth 2 (two) times daily.   28 tablet   0     Dispense as written.     BP 126/82  Pulse 76  Temp 97.4 F (36.3 C) (Oral)  Resp 16  SpO2 95%  LMP 08/26/2012  Physical Exam 1035: Physical examination:  Nursing notes reviewed; Vital signs and O2 SAT reviewed;  Constitutional: Well developed, Well nourished, Well hydrated, In no acute distress; Head:  Normocephalic, atraumatic; Eyes: EOMI, PERRL, No scleral icterus; ENMT: Mouth and pharynx normal, Mucous membranes moist; Neck: Supple, Full range of motion, No lymphadenopathy; Cardiovascular: Regular rate and rhythm, No murmur, rub, or gallop; Respiratory: Breath sounds clear & equal  bilaterally, No rales, rhonchi, wheezes.  Speaking full sentences with ease, Normal respiratory effort/excursion; Chest: Nontender, Movement normal; Abdomen: Soft, +diffusely tender to palp. No rebound or guarding. Nondistended, Normal bowel sounds; Genitourinary: No CVA tenderness; Extremities: Pulses normal, No tenderness, No edema, No calf edema or asymmetry.; Neuro: AA&Ox3, Major CN grossly intact.  Speech clear. No gross focal motor or sensory deficits in extremities.; Skin: Color normal, Warm, Dry.   ED Course  Procedures    MDM  MDM Reviewed: nursing note, vitals and previous chart Reviewed previous: labs Interpretation: labs and x-ray   Results for orders placed during the hospital encounter of 09/17/12  COMPREHENSIVE METABOLIC PANEL      Component Value Range   Sodium 135  135 - 145 mEq/L   Potassium 4.4  3.5 - 5.1 mEq/L   Chloride 99  96 - 112 mEq/L   CO2 26  19 - 32 mEq/L   Glucose, Bld 104 (*) 70 - 99 mg/dL   BUN 9  6 - 23 mg/dL   Creatinine, Ser 4.09  0.50 - 1.10 mg/dL   Calcium 9.4  8.4 - 81.1 mg/dL   Total Protein 7.9  6.0 - 8.3 g/dL   Albumin 3.9  3.5 - 5.2 g/dL   AST 87 (*) 0 - 37 U/L   ALT 96 (*) 0 - 35 U/L   Alkaline Phosphatase 126 (*) 39 - 117 U/L   Total Bilirubin 0.2 (*) 0.3 - 1.2 mg/dL   GFR calc non Af Amer >90  >90 mL/min   GFR calc Af Amer >90  >90 mL/min  CBC WITH DIFFERENTIAL      Component Value Range   WBC 18.4 (*) 4.0 - 10.5 K/uL   RBC 4.25  3.87 - 5.11 MIL/uL   Hemoglobin 14.4  12.0 - 15.0 g/dL   HCT 91.4  78.2 - 95.6 %   MCV 102.1 (*) 78.0 - 100.0 fL   MCH 33.9  26.0 - 34.0 pg   MCHC 33.2  30.0 - 36.0 g/dL   RDW 21.3  08.6 - 57.8 %   Platelets 364  150 - 400 K/uL   Neutrophils Relative 85 (*) 43 - 77 %   Neutro Abs 15.5 (*) 1.7 - 7.7 K/uL   Lymphocytes Relative 11 (*) 12 - 46 %   Lymphs Abs 1.9  0.7 - 4.0 K/uL   Monocytes Relative 4  3 - 12 %   Monocytes Absolute 0.7  0.1 - 1.0 K/uL   Eosinophils Relative 1  0 - 5 %   Eosinophils  Absolute 0.2  0.0 - 0.7 K/uL   Basophils Relative 0  0 - 1 %   Basophils Absolute 0.1  0.0 - 0.1 K/uL  PREGNANCY, URINE      Component Value Range   Preg Test, Ur NEGATIVE  NEGATIVE  URINALYSIS, ROUTINE W REFLEX MICROSCOPIC      Component Value Range   Color, Urine YELLOW  YELLOW   APPearance CLEAR  CLEAR   Specific Gravity, Urine <1.005 (*) 1.005 - 1.030   pH 6.0  5.0 - 8.0   Glucose, UA NEGATIVE  NEGATIVE mg/dL   Hgb urine dipstick NEGATIVE  NEGATIVE   Bilirubin Urine NEGATIVE  NEGATIVE   Ketones, ur NEGATIVE  NEGATIVE mg/dL   Protein, ur NEGATIVE  NEGATIVE mg/dL   Urobilinogen, UA 0.2  0.0 - 1.0 mg/dL   Nitrite NEGATIVE  NEGATIVE   Leukocytes, UA NEGATIVE  NEGATIVE  LIPASE, BLOOD      Component Value Range   Lipase 52  11 - 59 U/L  RAPID STREP SCREEN      Component Value Range   Streptococcus, Group A Screen (Direct) NEGATIVE  NEGATIVE    Ct Abdomen Pelvis W Contrast 09/17/2012  *RADIOLOGY REPORT*  Clinical Data: Abdominal pain with nausea and vomiting for several weeks.  History of appendectomy, cholecystectomy and tubal ligation.  CT ABDOMEN AND PELVIS WITH CONTRAST  Technique:  Multidetector CT imaging of the abdomen and pelvis was performed following the standard protocol during bolus administration of intravenous contrast.  Contrast: OMNIPAQUE IOHEXOL 300 MG/ML  SOLN  Comparison: Radiographs performed today.  Abdominal pelvic CT 04/21/2012.  Findings: The lung bases are clear.  There is no pleural effusion. There is slightly greater biliary prominence status post cholecystectomy.  The common hepatic duct measures 6 mm in greatest dimension.  The pancreas appears normal.  There is no pancreatic ductal dilatation or surrounding inflammatory change.  The liver, spleen and adrenal glands appear normal.  The kidneys are stable with mild cortical lobularity bilaterally.  There is no hydronephrosis.  The stomach, small bowel and colon appear unremarkable aside from mildly  prominent colonic stool.  There are postsurgical changes consistent with prior appendectomy.  No inflammatory changes are seen.  2.1 cm right ovarian follicle is noted.  The uterus, ovaries and bladder otherwise appear unremarkable.  IMPRESSION: Minimally more prominent biliary system status post cholecystectomy, still within physiologic limits.  Otherwise stable examination.  No acute abdominal pelvic findings identified.   Original Report Authenticated By: Carey Bullocks, M.D.    Dg Abd Acute W/chest 09/17/2012  *RADIOLOGY REPORT*  Clinical Data: Abdominal pain, nausea and vomiting for 1 week.  ACUTE ABDOMEN SERIES (ABDOMEN 2 VIEW & CHEST 1 VIEW)  Comparison: 09/09/2012 and 05/11/2012 radiographs.  Findings: The heart size and mediastinal contours are normal. The lungs are clear. There is no pleural effusion or pneumothorax. No acute osseous findings are identified.  The bowel gas pattern is normal.  There is no free intraperitoneal air or suspicious calcification.  Cholecystectomy clips are noted. Osseous structures appear unchanged.  IMPRESSION: Reversion to normal bowel gas pattern.  No acute cardiopulmonary or abdominal process identified.   Original Report Authenticated By: Carey Bullocks, M.D.      1410:  Pt has been already eval in the ED twice in the past 1 week for her URI symptoms, rx and states she is taking bactrim and prednisone.  Pt has been eval in the ED 9 times in the past 6 months for chronic abd pain, N/V.  Pt unwelcome at [facility] office due to missing scheduled appts, per EPIC chart review.  Pt already aware  that I will not be rx her narcotic pain meds upon discharge from the ED.  Pt has tol PO well while in the ED without N/V.  No stooling while in the ED. VSS.   Pt encouraged to f/u with her PMD, GI MD, and Pain Management doctor for good continuity of care and control of her chronic pain.  Verb understanding.  Dx and testing d/w pt and family.  Questions answered.  Verb  understanding, agreeable to d/c home with outpt f/u.      Laray Anger, DO 09/19/12 1513

## 2012-10-15 ENCOUNTER — Encounter (HOSPITAL_COMMUNITY): Payer: Self-pay

## 2012-10-15 ENCOUNTER — Emergency Department (HOSPITAL_COMMUNITY)
Admission: EM | Admit: 2012-10-15 | Discharge: 2012-10-15 | Disposition: A | Discharge status: Home / Self Care | Payer: Medicaid Other | Attending: Emergency Medicine | Admitting: Emergency Medicine

## 2012-10-15 DIAGNOSIS — IMO0002 Reserved for concepts with insufficient information to code with codable children: Secondary | ICD-10-CM | POA: Insufficient documentation

## 2012-10-15 DIAGNOSIS — R197 Diarrhea, unspecified: Secondary | ICD-10-CM | POA: Insufficient documentation

## 2012-10-15 DIAGNOSIS — G8929 Other chronic pain: Secondary | ICD-10-CM | POA: Insufficient documentation

## 2012-10-15 DIAGNOSIS — Z8679 Personal history of other diseases of the circulatory system: Secondary | ICD-10-CM | POA: Insufficient documentation

## 2012-10-15 DIAGNOSIS — F17200 Nicotine dependence, unspecified, uncomplicated: Secondary | ICD-10-CM | POA: Insufficient documentation

## 2012-10-15 DIAGNOSIS — Z79899 Other long term (current) drug therapy: Secondary | ICD-10-CM | POA: Insufficient documentation

## 2012-10-15 DIAGNOSIS — J029 Acute pharyngitis, unspecified: Secondary | ICD-10-CM | POA: Insufficient documentation

## 2012-10-15 DIAGNOSIS — J45901 Unspecified asthma with (acute) exacerbation: Secondary | ICD-10-CM | POA: Insufficient documentation

## 2012-10-15 LAB — CBC WITH DIFFERENTIAL/PLATELET
Eosinophils Absolute: 0.2 10*3/uL (ref 0.0–0.7)
Lymphocytes Relative: 18 % (ref 12–46)
Lymphs Abs: 2.4 10*3/uL (ref 0.7–4.0)
MCH: 33.8 pg (ref 26.0–34.0)
Neutrophils Relative %: 74 % (ref 43–77)
Platelets: 407 10*3/uL — ABNORMAL HIGH (ref 150–400)
RBC: 3.61 MIL/uL — ABNORMAL LOW (ref 3.87–5.11)
WBC: 13 10*3/uL — ABNORMAL HIGH (ref 4.0–10.5)

## 2012-10-15 LAB — COMPREHENSIVE METABOLIC PANEL
ALT: 12 U/L (ref 0–35)
AST: 10 U/L (ref 0–37)
Albumin: 3.1 g/dL — ABNORMAL LOW (ref 3.5–5.2)
Alkaline Phosphatase: 96 U/L (ref 39–117)
BUN: 7 mg/dL (ref 6–23)
CO2: 25 mEq/L (ref 19–32)
Calcium: 9.3 mg/dL (ref 8.4–10.5)
Chloride: 101 mEq/L (ref 96–112)
Creatinine, Ser: 0.59 mg/dL (ref 0.50–1.10)
GFR calc Af Amer: >90 mL/min (ref >90)
GFR calc non Af Amer: >90 mL/min (ref >90)
Glucose, Bld: 104 mg/dL — ABNORMAL HIGH (ref 70–99)
Potassium: 3.5 mEq/L (ref 3.5–5.1)
Sodium: 137 mEq/L (ref 135–145)
Total Bilirubin: 0.1 mg/dL — ABNORMAL LOW (ref 0.3–1.2)
Total Protein: 7.2 g/dL (ref 6.0–8.3)

## 2012-10-15 LAB — URINALYSIS, ROUTINE W REFLEX MICROSCOPIC
Bilirubin Urine: NEGATIVE
Glucose, UA: NEGATIVE mg/dL
Ketones, ur: NEGATIVE mg/dL
Leukocytes, UA: NEGATIVE
Nitrite: NEGATIVE
Protein, ur: NEGATIVE mg/dL
Specific Gravity, Urine: 1.025 (ref 1.005–1.030)
Urobilinogen, UA: 0.2 mg/dL (ref 0.0–1.0)
pH: 5.5 (ref 5.0–8.0)

## 2012-10-15 LAB — URINE MICROSCOPIC-ADD ON

## 2012-10-15 MED ORDER — ONDANSETRON HCL 4 MG/2ML IJ SOLN
4.0000 mg | Freq: Once | INTRAMUSCULAR | Status: AC
  Administered 2012-10-15: 4 mg via INTRAVENOUS
  Filled 2012-10-15: qty 2

## 2012-10-15 MED ORDER — DIPHENOXYLATE-ATROPINE 2.5-0.025 MG PO TABS: 1.0000 | ORAL_TABLET | Freq: Four times a day (QID) | ORAL | Status: DC | PRN

## 2012-10-15 MED ORDER — HYDROCODONE-ACETAMINOPHEN 5-325 MG PO TABS: 2.0000 | ORAL_TABLET | ORAL | Status: DC | PRN

## 2012-10-15 MED ORDER — HYDROMORPHONE HCL PF 1 MG/ML IJ SOLN
1.0000 mg | Freq: Once | INTRAMUSCULAR | Status: AC
  Administered 2012-10-15: 1 mg via INTRAVENOUS
  Filled 2012-10-15: qty 1

## 2012-10-15 MED ORDER — SODIUM CHLORIDE 0.9 % IV BOLUS (SEPSIS)
1000.0000 mL | Freq: Once | INTRAVENOUS | Status: AC
  Administered 2012-10-15: 1000 mL via INTRAVENOUS

## 2012-10-15 NOTE — ED Notes (Signed)
Vomiting, diarrhea, sore throat, cough, congestion x 1 week

## 2012-10-15 NOTE — ED Provider Notes (Signed)
History     CSN: [id number]  Arrival date & time 10/15/12  0020   First MD Initiated Contact with Patient 10/15/12 0107      Chief Complaint  Patient presents with  . Emesis  . Diarrhea  . Sore Throat    (Consider location/radiation/quality/duration/timing/severity/associated sxs/prior treatment) HPI Comments: 37 year old female with a history of chronic abdominal and back pain who presents with a complaint of sore throat, nausea vomiting and diarrhea. This started approximately one week ago, has been persistent, nothing seems to make it better or worse and is not associated with fevers rashes or swelling. She states that she has been sick since September with a sinus infection and has been on several different antibiotics the last of which was approximately one month ago. The patient states she has had approximately 7 episodes of watery diarrhea today, no blood in her stools and has been having diarrhea for 3 days.  Patient is a 37 y.o. female presenting with vomiting, diarrhea, and pharyngitis. The history is provided by the patient and medical records.  Emesis  Associated symptoms include diarrhea.  Diarrhea The primary symptoms include vomiting and diarrhea.  Sore Throat    Past Medical History  Diagnosis Date  . Asthma   . Migraine   . Chronic abdominal pain   . Chronic back pain     Past Surgical History  Procedure Date  . Appendectomy   . Cholecystectomy   . Tonsillectomy   . Tubal ligation   . Brain surgery     No family history on file.  History  Substance Use Topics  . Smoking status: Current Every Day Smoker -- 1.0 packs/day for 15 years    Types: Cigarettes  . Smokeless tobacco: Never Used  . Alcohol Use: No    OB History    Grav Para Term Preterm Abortions TAB SAB Ect Mult Living                  Review of Systems  Gastrointestinal: Positive for vomiting and diarrhea.  All other systems reviewed and are negative.    Allergies  Bee  venom; Ketorolac tromethamine; Imitrex; and Isometheptene-apap-dichloral  Home Medications   Current Outpatient Rx  Name  Route  Sig  Dispense  Refill  . ALBUTEROL SULFATE HFA 108 (90 BASE) MCG/ACT IN AERS   Inhalation   Inhale 2 puffs into the lungs every 4 (four) hours as needed. Shortness of breath         . ALBUTEROL SULFATE (5 MG/ML) 0.5% IN NEBU   Nebulization   Take 0.5 mLs (2.5 mg total) by nebulization every 6 (six) hours as needed for wheezing.   20 mL   12   . DIPHENOXYLATE-ATROPINE 2.5-0.025 MG PO TABS   Oral   Take 1 tablet by mouth 4 (four) times daily as needed for diarrhea or loose stools.   30 tablet   0   . HYDROCODONE-ACETAMINOPHEN 5-325 MG PO TABS   Oral   Take 1 tablet by mouth every 6 (six) hours as needed for pain.   20 tablet   0   . HYDROCODONE-ACETAMINOPHEN 5-325 MG PO TABS   Oral   Take 2 tablets by mouth every 4 (four) hours as needed for pain.   8 tablet   0   . ONDANSETRON HCL 4 MG PO TABS   Oral   Take 1 tablet (4 mg total) by mouth every 6 (six) hours.   12 tablet   0   .  PREDNISONE 10 MG PO TABS   Oral   Take 2 tablets (20 mg total) by mouth daily.   14 tablet   0   . PROMETHAZINE HCL 25 MG PO TABS   Oral   Take 1 tablet (25 mg total) by mouth every 6 (six) hours as needed for nausea.   15 tablet   0   . TRAMADOL HCL 50 MG PO TABS   Oral   Take 1 tablet (50 mg total) by mouth every 6 (six) hours as needed for pain.   15 tablet   0     BP 119/92  Pulse 120  Temp 97.8 F (36.6 C) (Oral)  Resp 18  Ht 4\' 11"  (1.499 m)  Wt 160 lb (72.576 kg)  BMI 32.32 kg/m2  SpO2 98%  LMP 10/03/2012  Physical Exam  Nursing note and vitals reviewed. Constitutional: She appears well-developed and well-nourished. No distress.  HENT:  Head: Normocephalic and atraumatic.  Mouth/Throat: No oropharyngeal exudate.       Mucous membranes are dry and dehydrated, posterior erythema of the pharynx, no exudate asymmetry or hypertrophy,  tympanic membranes are normal in appearance  Eyes: Conjunctivae normal and EOM are normal. Pupils are equal, round, and reactive to light. Right eye exhibits no discharge. Left eye exhibits no discharge. No scleral icterus.  Neck: Normal range of motion. Neck supple. No JVD present. No thyromegaly present.  Cardiovascular: Regular rhythm, normal heart sounds and intact distal pulses.  Exam reveals no gallop and no friction rub.   No murmur heard.      Tachycardic to 110  Pulmonary/Chest: Effort normal. No respiratory distress. She has wheezes (mild end expiratory wheezes). She has no rales.       Speaking in full sentences, hoarse voice  Abdominal: Soft. Bowel sounds are normal. She exhibits no distension and no mass. There is no tenderness.       The patient complains of lower abdominal tenderness she does not have any masses guarding tenderness or peritoneal signs  Musculoskeletal: Normal range of motion. She exhibits no edema and no tenderness.  Lymphadenopathy:    She has no cervical adenopathy.  Neurological: She is alert. Coordination normal.  Skin: Skin is warm and dry. No rash noted. No erythema.  Psychiatric: She has a normal mood and affect. Her behavior is normal.    ED Course  Procedures (including critical care time)  Labs Reviewed  CBC WITH DIFFERENTIAL - Abnormal; Notable for the following:    WBC 13.0 (*)     RBC 3.61 (*)     Platelets 407 (*)     Neutro Abs 9.6 (*)     All other components within normal limits  COMPREHENSIVE METABOLIC PANEL - Abnormal; Notable for the following:    Glucose, Bld 104 (*)     Albumin 3.1 (*)     Total Bilirubin 0.1 (*)     All other components within normal limits  URINALYSIS, ROUTINE W REFLEX MICROSCOPIC - Abnormal; Notable for the following:    Hgb urine dipstick TRACE (*)     All other components within normal limits  URINE MICROSCOPIC-ADD ON - Abnormal; Notable for the following:    Squamous Epithelial / LPF MANY (*)     All  other components within normal limits  PREGNANCY, URINE   No results found.   1. Diarrhea   2. Pharyngitis       MDM  Patient will need evaluation for level of dehydration, check renal  function, give IV rehydration, albuterol treatment for wheezing. This is likely a viral syndrome  Labs all normal except for mild leukocytosis.  She has improved VS, will be given hydrocodone 8 tabs for home and lomotil - encouraged close f/u.        Vida Roller, MD 10/15/12 0230

## 2012-10-23 ENCOUNTER — Other Ambulatory Visit (HOSPITAL_COMMUNITY): Payer: Self-pay | Admitting: Internal Medicine

## 2012-10-23 DIAGNOSIS — N63 Unspecified lump in unspecified breast: Secondary | ICD-10-CM

## 2012-10-24 ENCOUNTER — Ambulatory Visit (HOSPITAL_COMMUNITY)
Admission: RE | Admit: 2012-10-24 | Discharge: 2012-10-24 | Disposition: A | Discharge status: Home / Self Care | Payer: Medicaid Other | Source: Ambulatory Visit | Attending: Internal Medicine | Admitting: Internal Medicine

## 2012-10-24 ENCOUNTER — Other Ambulatory Visit (HOSPITAL_COMMUNITY): Payer: Self-pay | Admitting: Internal Medicine

## 2012-10-24 DIAGNOSIS — N63 Unspecified lump in unspecified breast: Secondary | ICD-10-CM

## 2012-10-24 DIAGNOSIS — N6009 Solitary cyst of unspecified breast: Secondary | ICD-10-CM | POA: Insufficient documentation

## 2012-12-23 ENCOUNTER — Encounter (HOSPITAL_COMMUNITY): Payer: Self-pay | Admitting: *Deleted

## 2012-12-23 ENCOUNTER — Emergency Department (HOSPITAL_COMMUNITY)
Admission: EM | Admit: 2012-12-23 | Discharge: 2012-12-23 | Disposition: A | Discharge status: Home / Self Care | Payer: Medicaid Other | Attending: Emergency Medicine | Admitting: Emergency Medicine

## 2012-12-23 DIAGNOSIS — Z3202 Encounter for pregnancy test, result negative: Secondary | ICD-10-CM | POA: Insufficient documentation

## 2012-12-23 DIAGNOSIS — F17200 Nicotine dependence, unspecified, uncomplicated: Secondary | ICD-10-CM | POA: Insufficient documentation

## 2012-12-23 DIAGNOSIS — R3915 Urgency of urination: Secondary | ICD-10-CM | POA: Insufficient documentation

## 2012-12-23 DIAGNOSIS — Z8679 Personal history of other diseases of the circulatory system: Secondary | ICD-10-CM | POA: Insufficient documentation

## 2012-12-23 DIAGNOSIS — J3489 Other specified disorders of nose and nasal sinuses: Secondary | ICD-10-CM | POA: Insufficient documentation

## 2012-12-23 DIAGNOSIS — R112 Nausea with vomiting, unspecified: Secondary | ICD-10-CM

## 2012-12-23 DIAGNOSIS — J329 Chronic sinusitis, unspecified: Secondary | ICD-10-CM | POA: Insufficient documentation

## 2012-12-23 DIAGNOSIS — M549 Dorsalgia, unspecified: Secondary | ICD-10-CM | POA: Insufficient documentation

## 2012-12-23 DIAGNOSIS — Z87440 Personal history of urinary (tract) infections: Secondary | ICD-10-CM | POA: Insufficient documentation

## 2012-12-23 DIAGNOSIS — J45909 Unspecified asthma, uncomplicated: Secondary | ICD-10-CM | POA: Insufficient documentation

## 2012-12-23 DIAGNOSIS — G8929 Other chronic pain: Secondary | ICD-10-CM | POA: Insufficient documentation

## 2012-12-23 LAB — CBC WITH DIFFERENTIAL/PLATELET
Basophils Absolute: 0.1 10*3/uL (ref 0.0–0.1)
Basophils Relative: 1 % (ref 0–1)
Eosinophils Absolute: 0.1 10*3/uL (ref 0.0–0.7)
Hemoglobin: 14.9 g/dL (ref 12.0–15.0)
MCH: 33.5 pg (ref 26.0–34.0)
MCHC: 33.9 g/dL (ref 30.0–36.0)
Neutro Abs: 8.6 10*3/uL — ABNORMAL HIGH (ref 1.7–7.7)
Neutrophils Relative %: 76 % (ref 43–77)
Platelets: 306 10*3/uL (ref 150–400)

## 2012-12-23 LAB — URINE MICROSCOPIC-ADD ON

## 2012-12-23 LAB — COMPREHENSIVE METABOLIC PANEL
ALT: 18 U/L (ref 0–35)
AST: 17 U/L (ref 0–37)
Albumin: 4.1 g/dL (ref 3.5–5.2)
Alkaline Phosphatase: 108 U/L (ref 39–117)
Chloride: 99 mEq/L (ref 96–112)
Potassium: 4.1 mEq/L (ref 3.5–5.1)
Sodium: 136 mEq/L (ref 135–145)
Total Bilirubin: 0.4 mg/dL (ref 0.3–1.2)
Total Protein: 7.9 g/dL (ref 6.0–8.3)

## 2012-12-23 LAB — URINALYSIS, ROUTINE W REFLEX MICROSCOPIC
Glucose, UA: NEGATIVE mg/dL
Ketones, ur: NEGATIVE mg/dL
Leukocytes, UA: NEGATIVE
Nitrite: NEGATIVE
Protein, ur: NEGATIVE mg/dL
pH: 6 (ref 5.0–8.0)

## 2012-12-23 LAB — PREGNANCY, URINE: Preg Test, Ur: NEGATIVE

## 2012-12-23 MED ORDER — ONDANSETRON 8 MG PO TBDP: 8.0000 mg | ORAL_TABLET | Freq: Three times a day (TID) | ORAL | Status: DC | PRN

## 2012-12-23 MED ORDER — ONDANSETRON HCL 4 MG/2ML IJ SOLN
4.0000 mg | Freq: Once | INTRAMUSCULAR | Status: AC
  Administered 2012-12-23: 4 mg via INTRAVENOUS
  Filled 2012-12-23: qty 2

## 2012-12-23 MED ORDER — SODIUM CHLORIDE 0.9 % IV BOLUS (SEPSIS)
1000.0000 mL | Freq: Once | INTRAVENOUS | Status: AC
  Administered 2012-12-23: 1000 mL via INTRAVENOUS

## 2012-12-23 NOTE — ED Notes (Signed)
Pt reports chronic sinus infection for several months, also having recurrent nausea and vomiting that started last night. Also having diarrhea that she reports as chronic.

## 2012-12-23 NOTE — ED Notes (Signed)
Pt c/o sinus infection since October, n/v that started last night, lower back pain "for awhile" does admit to diarrhea but states that she normally has diarrhea and that it has not changed.

## 2012-12-23 NOTE — ED Provider Notes (Signed)
History  This chart was scribed for Hilario Quarry, MD, by Candelaria Stagers, ED Scribe. This patient was seen in room APA04/[facility]04 and the patient's care was started at 12:21 PM   CSN: [id number]  Arrival date & time 12/23/12  1104   First MD Initiated Contact with Patient 12/23/12 1141      Chief Complaint  Patient presents with  . Nausea  . Emesis  . Recurrent Sinusitis     The history is provided by the patient. No language interpreter was used.   Cheyane BRIANN SARCHET is a 37 y.o. female who presents to the Emergency Department complaining of nausea and vomiting that started last night reporting that she has vomited about five times.  She denies fever, chills, or rash.  Pt is experiencing associated increased urination.  She reports she has not been eating or drinking normally due to nausea and vomiting.  Nothing seems to make the sx better or worse.  Pt reports she has also been experiencing sinus pressure and congestion for about six months and was treated for a sinus infection by her PCP Dr. Felecia Shelling with a Z-pack with no relief.  Pt smokes.  She has h/o UTI.   Past Medical History  Diagnosis Date  . Asthma   . Migraine   . Chronic abdominal pain   . Chronic back pain     Past Surgical History  Procedure Laterality Date  . Appendectomy    . Cholecystectomy    . Tonsillectomy    . Tubal ligation    . Brain surgery      No family history on file.  History  Substance Use Topics  . Smoking status: Current Every Day Smoker -- 1.00 packs/day for 15 years    Types: Cigarettes  . Smokeless tobacco: Never Used  . Alcohol Use: No    OB History   Grav Para Term Preterm Abortions TAB SAB Ect Mult Living                  Review of Systems  Gastrointestinal: Positive for nausea and vomiting.  Genitourinary: Positive for urgency. Negative for vaginal discharge.  All other systems reviewed and are negative.    Allergies  Bee venom; Ketorolac tromethamine; Imitrex; and  Isometheptene-apap-dichloral  Home Medications   Current Outpatient Rx  Name  Route  Sig  Dispense  Refill  . acetaminophen (TYLENOL) 500 MG tablet   Oral   Take 1,000 mg by mouth every 6 (six) hours as needed for pain.         Marland Kitchen ibuprofen (ADVIL,MOTRIN) 200 MG tablet   Oral   Take 800 mg by mouth every 6 (six) hours as needed for pain.           BP 159/102  Pulse 89  Temp(Src) 98 F (36.7 C) (Oral)  Resp 20  Ht 4\' 11"  (1.499 m)  Wt 160 lb (72.576 kg)  BMI 32.3 kg/m2  SpO2 98%  LMP 12/16/2012  Physical Exam  Nursing note and vitals reviewed. Constitutional: She is oriented to person, place, and time. She appears well-developed and well-nourished. No distress.  HENT:  Head: Normocephalic and atraumatic.  Eyes: EOM are normal.  Neck: Neck supple. No tracheal deviation present.  Cardiovascular: Normal rate.   Pulmonary/Chest: Effort normal. No respiratory distress.  Abdominal: Soft. There is no tenderness.  Musculoskeletal: Normal range of motion.  Neurological: She is alert and oriented to person, place, and time.  Skin: Skin is warm and dry.  Psychiatric: She has a normal mood and affect. Her behavior is normal.    ED Course  Procedures  DIAGNOSTIC STUDIES: Oxygen Saturation is 98% on room air, normal by my interpretation.    COORDINATION OF CARE:  12:20 PM Discussed course of care which includes lab work.  Pt understands and agrees.    Labs Reviewed  URINALYSIS, ROUTINE W REFLEX MICROSCOPIC - Abnormal; Notable for the following:    Specific Gravity, Urine <1.005 (*)    Hgb urine dipstick TRACE (*)    All other components within normal limits  URINE MICROSCOPIC-ADD ON - Abnormal; Notable for the following:    Squamous Epithelial / LPF FEW (*)    All other components within normal limits  PREGNANCY, URINE  CBC WITH DIFFERENTIAL  COMPREHENSIVE METABOLIC PANEL   No results found.   No diagnosis found.    MDM  I personally performed the  services described in this documentation, which was scribed in my presence. The recorded information has been reviewed and considered.         Hilario Quarry, MD 12/23/12 1435

## 2012-12-23 NOTE — ED Notes (Signed)
Patient assisted to the restroom and back to room. Tolerated well. 

## 2013-01-09 ENCOUNTER — Emergency Department (HOSPITAL_COMMUNITY)
Admission: EM | Admit: 2013-01-09 | Discharge: 2013-01-09 | Disposition: A | Discharge status: Home / Self Care | Payer: Medicaid Other | Attending: Emergency Medicine | Admitting: Emergency Medicine

## 2013-01-09 ENCOUNTER — Encounter (HOSPITAL_COMMUNITY): Payer: Self-pay | Admitting: Emergency Medicine

## 2013-01-09 DIAGNOSIS — Z8719 Personal history of other diseases of the digestive system: Secondary | ICD-10-CM | POA: Insufficient documentation

## 2013-01-09 DIAGNOSIS — M25521 Pain in right elbow: Secondary | ICD-10-CM

## 2013-01-09 DIAGNOSIS — F17200 Nicotine dependence, unspecified, uncomplicated: Secondary | ICD-10-CM | POA: Insufficient documentation

## 2013-01-09 DIAGNOSIS — Z8679 Personal history of other diseases of the circulatory system: Secondary | ICD-10-CM | POA: Insufficient documentation

## 2013-01-09 DIAGNOSIS — R296 Repeated falls: Secondary | ICD-10-CM | POA: Insufficient documentation

## 2013-01-09 DIAGNOSIS — Y9289 Other specified places as the place of occurrence of the external cause: Secondary | ICD-10-CM | POA: Insufficient documentation

## 2013-01-09 DIAGNOSIS — G8929 Other chronic pain: Secondary | ICD-10-CM | POA: Insufficient documentation

## 2013-01-09 DIAGNOSIS — J45909 Unspecified asthma, uncomplicated: Secondary | ICD-10-CM | POA: Insufficient documentation

## 2013-01-09 DIAGNOSIS — S59909A Unspecified injury of unspecified elbow, initial encounter: Secondary | ICD-10-CM | POA: Insufficient documentation

## 2013-01-09 DIAGNOSIS — Y93K1 Activity, walking an animal: Secondary | ICD-10-CM | POA: Insufficient documentation

## 2013-01-09 DIAGNOSIS — S6990XA Unspecified injury of unspecified wrist, hand and finger(s), initial encounter: Secondary | ICD-10-CM | POA: Insufficient documentation

## 2013-01-09 DIAGNOSIS — Z8739 Personal history of other diseases of the musculoskeletal system and connective tissue: Secondary | ICD-10-CM | POA: Insufficient documentation

## 2013-01-09 NOTE — ED Notes (Signed)
Pt reports pain in right elbow after tripping and falling.  Denies relief from OTC medications.  No decreased ROM noted, although pt does report pain with movement.

## 2013-01-09 NOTE — ED Notes (Signed)
Pt c/o rt elbow pain since tripping over a dog on Sunday.

## 2013-01-09 NOTE — ED Provider Notes (Signed)
History     CSN: [id number]  Arrival date & time 01/09/13  0010   First MD Initiated Contact with Patient 01/09/13 0045      Chief Complaint  Patient presents with  . Elbow Pain  . Fall    (Consider location/radiation/quality/duration/timing/severity/associated sxs/prior treatment) HPI Bonnie Jensen is a 37 y.o. female who presents to the Emergency Department complaining of right elbow pain after falling while walking the dog. She has been using heat, ice, tylenol and ibuprofen. Last taken just prior to arrival.   PCP Dr. Felecia Shelling  Past Medical History  Diagnosis Date  . Asthma   . Migraine   . Chronic abdominal pain   . Chronic back pain     Past Surgical History  Procedure Laterality Date  . Appendectomy    . Cholecystectomy    . Tonsillectomy    . Tubal ligation    . Brain surgery      History reviewed. No pertinent family history.  History  Substance Use Topics  . Smoking status: Current Every Day Smoker -- 1.00 packs/day for 15 years    Types: Cigarettes  . Smokeless tobacco: Never Used  . Alcohol Use: No    OB History   Grav Para Term Preterm Abortions TAB SAB Ect Mult Living                  Review of Systems  Constitutional: Negative for fever.       10 Systems reviewed and are negative for acute change except as noted in the HPI.  HENT: Negative for congestion.   Eyes: Negative for discharge and redness.  Respiratory: Negative for cough and shortness of breath.   Cardiovascular: Negative for chest pain.  Gastrointestinal: Negative for vomiting and abdominal pain.  Musculoskeletal: Negative for back pain.       Right elbow  Skin: Negative for rash.  Neurological: Negative for syncope, numbness and headaches.  Psychiatric/Behavioral:       No behavior change.    Allergies  Bee venom; Ketorolac tromethamine; Imitrex; and Isometheptene-apap-dichloral  Home Medications   Current Outpatient Rx  Name  Route  Sig  Dispense  Refill  .  acetaminophen (TYLENOL) 500 MG tablet   Oral   Take 1,000 mg by mouth every 6 (six) hours as needed for pain.         Marland Kitchen ibuprofen (ADVIL,MOTRIN) 200 MG tablet   Oral   Take 800 mg by mouth every 6 (six) hours as needed for pain.         Marland Kitchen ondansetron (ZOFRAN-ODT) 8 MG disintegrating tablet   Oral   Take 1 tablet (8 mg total) by mouth every 8 (eight) hours as needed for nausea.   20 tablet   0     BP 126/76  Pulse 93  Temp(Src) 98.3 F (36.8 C) (Oral)  Resp 20  Ht 4\' 11"  (1.499 m)  Wt 160 lb (72.576 kg)  BMI 32.3 kg/m2  SpO2 97%  LMP 12/16/2012  Physical Exam  Nursing note and vitals reviewed. Constitutional: She appears well-developed and well-nourished.  Awake, alert, nontoxic appearance.  HENT:  Head: Normocephalic and atraumatic.  Right Ear: External ear normal.  Left Ear: External ear normal.  Eyes: EOM are normal. Pupils are equal, round, and reactive to light.  Neck: Neck supple.  Cardiovascular: Normal rate and intact distal pulses.   Pulmonary/Chest: Effort normal and breath sounds normal. She exhibits no tenderness.  Abdominal: Soft. Bowel sounds are normal. There  is no tenderness. There is no rebound.  Musculoskeletal: She exhibits no tenderness.  Baseline ROM, no obvious new focal weakness.Right elbow without swelling , lesions, focal tenderness.No deformity. Pulses 2+  Neurological:  Mental status and motor strength appears baseline for patient and situation.  Skin: No rash noted.  Psychiatric: She has a normal mood and affect.    ED Course  Procedures (including critical care time)    MDM  Patient with right elbow pain since fall on Sunday. Using tylenol and ibuprofen. Referral made to orthopedist. Pt stable in ED with no significant deterioration in condition.The patient appears reasonably screened and/or stabilized for discharge and I doubt any other medical condition or other [facility] requiring further screening, evaluation, or treatment in the ED  at this time prior to discharge.  MDM Reviewed: nursing note and vitals           Nicoletta Dress. Colon Branch, MD 01/09/13 2952

## 2013-03-08 ENCOUNTER — Emergency Department (HOSPITAL_COMMUNITY)
Admission: EM | Admit: 2013-03-08 | Discharge: 2013-03-09 | Disposition: A | Discharge status: Home / Self Care | Payer: Medicaid Other | Attending: Emergency Medicine | Admitting: Emergency Medicine

## 2013-03-08 ENCOUNTER — Encounter (HOSPITAL_COMMUNITY): Payer: Self-pay | Admitting: *Deleted

## 2013-03-08 DIAGNOSIS — F17200 Nicotine dependence, unspecified, uncomplicated: Secondary | ICD-10-CM | POA: Insufficient documentation

## 2013-03-08 DIAGNOSIS — M545 Low back pain, unspecified: Secondary | ICD-10-CM | POA: Insufficient documentation

## 2013-03-08 DIAGNOSIS — Z8679 Personal history of other diseases of the circulatory system: Secondary | ICD-10-CM | POA: Insufficient documentation

## 2013-03-08 DIAGNOSIS — M549 Dorsalgia, unspecified: Secondary | ICD-10-CM

## 2013-03-08 DIAGNOSIS — Z8739 Personal history of other diseases of the musculoskeletal system and connective tissue: Secondary | ICD-10-CM | POA: Insufficient documentation

## 2013-03-08 DIAGNOSIS — R112 Nausea with vomiting, unspecified: Secondary | ICD-10-CM | POA: Insufficient documentation

## 2013-03-08 DIAGNOSIS — Z3202 Encounter for pregnancy test, result negative: Secondary | ICD-10-CM | POA: Insufficient documentation

## 2013-03-08 DIAGNOSIS — J45909 Unspecified asthma, uncomplicated: Secondary | ICD-10-CM | POA: Insufficient documentation

## 2013-03-08 NOTE — ED Notes (Signed)
Mid-Lower back pain x 3 days and vomiting today.

## 2013-03-09 LAB — URINE MICROSCOPIC-ADD ON

## 2013-03-09 LAB — URINALYSIS, ROUTINE W REFLEX MICROSCOPIC
Glucose, UA: NEGATIVE mg/dL
Specific Gravity, Urine: 1.01 (ref 1.005–1.030)
Urobilinogen, UA: 0.2 mg/dL (ref 0.0–1.0)
pH: 7 (ref 5.0–8.0)

## 2013-03-09 LAB — PREGNANCY, URINE: Preg Test, Ur: NEGATIVE

## 2013-03-09 MED ORDER — HYDROCODONE-ACETAMINOPHEN 5-325 MG PO TABS: 2.0000 | ORAL_TABLET | ORAL | Status: DC | PRN

## 2013-03-09 MED ORDER — HYDROCODONE-ACETAMINOPHEN 5-325 MG PO TABS
2.0000 | ORAL_TABLET | Freq: Once | ORAL | Status: AC
  Administered 2013-03-09: 2 via ORAL
  Filled 2013-03-09: qty 2

## 2013-03-09 NOTE — ED Provider Notes (Signed)
History     CSN: [id number]  Arrival date & time 03/08/13  2235   First MD Initiated Contact with Patient 03/08/13 2357      Chief Complaint  Patient presents with  . Emesis    (Consider location/radiation/quality/duration/timing/severity/associated sxs/prior treatment) HPI Comments: Patient started earlier today with pain in the lower back, feels nauseated, and has vomited.  No injury or trauma.  No fevers or chills.  No urinary complaints.    Patient is a 37 y.o. female presenting with vomiting. The history is provided by the patient.  Emesis Severity:  Moderate Duration:  6 hours Timing:  Constant Quality:  Stomach contents Progression:  Worsening Relieved by:  Nothing Worsened by:  Nothing tried Ineffective treatments:  None tried Associated symptoms comment:  Back pain  Risk factors: no diabetes     Past Medical History  Diagnosis Date  . Asthma   . Migraine   . Chronic abdominal pain   . Chronic back pain     Past Surgical History  Procedure Laterality Date  . Appendectomy    . Cholecystectomy    . Tonsillectomy    . Tubal ligation    . Brain surgery      History reviewed. No pertinent family history.  History  Substance Use Topics  . Smoking status: Current Every Day Smoker -- 1.00 packs/day for 15 years    Types: Cigarettes  . Smokeless tobacco: Never Used  . Alcohol Use: No    OB History   Grav Para Term Preterm Abortions TAB SAB Ect Mult Living                  Review of Systems  Gastrointestinal: Positive for vomiting.  All other systems reviewed and are negative.    Allergies  Bee venom; Ketorolac tromethamine; Imitrex; and Isometheptene-apap-dichloral  Home Medications   Current Outpatient Rx  Name  Route  Sig  Dispense  Refill  . acetaminophen (TYLENOL) 500 MG tablet   Oral   Take 1,000 mg by mouth every 6 (six) hours as needed for pain.         Marland Kitchen ibuprofen (ADVIL,MOTRIN) 200 MG tablet   Oral   Take 800 mg by mouth  every 6 (six) hours as needed for pain.         Marland Kitchen ondansetron (ZOFRAN-ODT) 8 MG disintegrating tablet   Oral   Take 1 tablet (8 mg total) by mouth every 8 (eight) hours as needed for nausea.   20 tablet   0     BP 153/93  Pulse 94  Temp(Src) 98.3 F (36.8 C) (Oral)  Resp 20  Ht 4\' 11"  (1.499 m)  Wt 175 lb (79.379 kg)  BMI 35.33 kg/m2  SpO2 99%  LMP 02/24/2013  Physical Exam  Nursing note and vitals reviewed. Constitutional: She is oriented to person, place, and time. She appears well-developed and well-nourished. No distress.  HENT:  Head: Normocephalic and atraumatic.  Neck: Normal range of motion. Neck supple.  Cardiovascular: Normal rate and regular rhythm.  Exam reveals no gallop and no friction rub.   No murmur heard. Pulmonary/Chest: Effort normal and breath sounds normal. No respiratory distress. She has no wheezes.  Abdominal: Soft. Bowel sounds are normal. She exhibits no distension. There is no tenderness.  Musculoskeletal: Normal range of motion. She exhibits no edema.  Neurological: She is alert and oriented to person, place, and time.  The DTR's are 2+ and equal in the ble.  Strength is 5/5  in the BLE and she is able to walk on her heels and toes without difficulty.  Skin: Skin is warm and dry. She is not diaphoretic.    ED Course  Procedures (including critical care time)  Labs Reviewed  URINALYSIS, ROUTINE W REFLEX MICROSCOPIC  PREGNANCY, URINE   No results found.   No diagnosis found.    MDM  The ua is negative.  This appears to be a musculoskeletal pain without evidence for cauda equina or bothersome findings on the history / exam.  Will treat with nsaids, a few norco.  To follow up with pcp if not improving in the next week.          Geoffery Lyons, MD 03/09/13 [phone]

## 2013-03-09 NOTE — ED Notes (Signed)
Pt alert & oriented x4, stable gait. Patient given discharge instructions, paperwork & prescription(s). Patient  instructed to stop at the registration desk to finish any additional paperwork. Patient verbalized understanding. Pt left department w/ no further questions. 

## 2013-04-08 ENCOUNTER — Emergency Department (HOSPITAL_COMMUNITY)
Admission: EM | Admit: 2013-04-08 | Discharge: 2013-04-08 | Disposition: A | Discharge status: Home / Self Care | Payer: Medicaid Other | Attending: Emergency Medicine | Admitting: Emergency Medicine

## 2013-04-08 ENCOUNTER — Encounter (HOSPITAL_COMMUNITY): Payer: Self-pay

## 2013-04-08 DIAGNOSIS — G8929 Other chronic pain: Secondary | ICD-10-CM | POA: Insufficient documentation

## 2013-04-08 DIAGNOSIS — F17200 Nicotine dependence, unspecified, uncomplicated: Secondary | ICD-10-CM | POA: Insufficient documentation

## 2013-04-08 DIAGNOSIS — K029 Dental caries, unspecified: Secondary | ICD-10-CM | POA: Insufficient documentation

## 2013-04-08 DIAGNOSIS — M549 Dorsalgia, unspecified: Secondary | ICD-10-CM | POA: Insufficient documentation

## 2013-04-08 DIAGNOSIS — Z8719 Personal history of other diseases of the digestive system: Secondary | ICD-10-CM | POA: Insufficient documentation

## 2013-04-08 DIAGNOSIS — Z8679 Personal history of other diseases of the circulatory system: Secondary | ICD-10-CM | POA: Insufficient documentation

## 2013-04-08 DIAGNOSIS — K089 Disorder of teeth and supporting structures, unspecified: Secondary | ICD-10-CM | POA: Insufficient documentation

## 2013-04-08 DIAGNOSIS — J45909 Unspecified asthma, uncomplicated: Secondary | ICD-10-CM | POA: Insufficient documentation

## 2013-04-08 MED ORDER — HYDROCODONE-ACETAMINOPHEN 5-325 MG PO TABS
2.0000 | ORAL_TABLET | Freq: Once | ORAL | Status: AC
  Administered 2013-04-08: 2 via ORAL
  Filled 2013-04-08: qty 2

## 2013-04-08 MED ORDER — HYDROCODONE-ACETAMINOPHEN 5-325 MG PO TABS: 1.0000 | ORAL_TABLET | ORAL | Status: DC | PRN

## 2013-04-08 NOTE — ED Notes (Signed)
Pt reports dental pain since may, she has multiple dental caries and is unable to see her dentist for tooth extraction until august 18th.

## 2013-04-08 NOTE — ED Notes (Signed)
Alert, talking, dental pain, Has appt with oral surgeon in August.

## 2013-04-13 NOTE — ED Provider Notes (Signed)
History    CSN: [id number] Arrival date & time 04/08/13  1128  First MD Initiated Contact with Patient 04/08/13 1144     Chief Complaint  Patient presents with  . Dental Pain   (Consider location/radiation/quality/duration/timing/severity/associated sxs/prior Treatment) HPI Comments: Bonnie Jensen is a 37 y.o. Female presenting with chronic intermittent dental pain which has been worse the past few days.  She has multiple cavities which are causing pain with cold sensitivity, making eating and even breathing painful.     She denies gingival swelling, fevers or chills and has had no nausea or vomiting, also no complaint of difficulty swallowing,  Although chewing makes pain worse.  The patient has tried tylenol without relief of symptoms. She is scheduled to see Dr. Manson Passey an oral surgeon next month.       The history is provided by the patient.   Past Medical History  Diagnosis Date  . Asthma   . Migraine   . Chronic abdominal pain   . Chronic back pain    Past Surgical History  Procedure Laterality Date  . Appendectomy    . Cholecystectomy    . Tonsillectomy    . Tubal ligation    . Brain surgery     No family history on file. History  Substance Use Topics  . Smoking status: Current Every Day Smoker -- 1.00 packs/day for 15 years    Types: Cigarettes  . Smokeless tobacco: Never Used  . Alcohol Use: No   OB History   Grav Para Term Preterm Abortions TAB SAB Ect Mult Living                 Review of Systems  Constitutional: Negative for fever.  HENT: Positive for dental problem. Negative for sore throat, facial swelling, neck pain and neck stiffness.   Respiratory: Negative for shortness of breath.     Allergies  Bee venom; Ketorolac tromethamine; Imitrex; and Isometheptene-apap-dichloral  Home Medications   Current Outpatient Rx  Name  Route  Sig  Dispense  Refill  . acetaminophen (TYLENOL) 500 MG tablet   Oral   Take 1,000 mg by mouth every 6 (six)  hours as needed for pain.         Marland Kitchen HYDROcodone-acetaminophen (NORCO) 5-325 MG per tablet   Oral   Take 2 tablets by mouth every 4 (four) hours as needed for pain.   12 tablet   0   . HYDROcodone-acetaminophen (NORCO/VICODIN) 5-325 MG per tablet   Oral   Take 1 tablet by mouth every 4 (four) hours as needed.   20 tablet   0   . ibuprofen (ADVIL,MOTRIN) 200 MG tablet   Oral   Take 800 mg by mouth every 6 (six) hours as needed for pain.         Marland Kitchen ondansetron (ZOFRAN-ODT) 8 MG disintegrating tablet   Oral   Take 1 tablet (8 mg total) by mouth every 8 (eight) hours as needed for nausea.   20 tablet   0    BP 162/87  Pulse 86  Temp(Src) 98.7 F (37.1 C) (Oral)  Resp 20  Ht 4\' 11"  (1.499 m)  Wt 178 lb 9 oz (80.995 kg)  BMI 36.05 kg/m2  SpO2 100%  LMP 03/25/2013 Physical Exam  Constitutional: She is oriented to person, place, and time. She appears well-developed and well-nourished. No distress.  HENT:  Head: Normocephalic and atraumatic. No trismus in the jaw.  Right Ear: Tympanic membrane and external ear  normal.  Left Ear: Tympanic membrane and external ear normal.  Mouth/Throat: Oropharynx is clear and moist and mucous membranes are normal. No oral lesions. Dental caries present. No dental abscesses or edematous.  Eyes: Conjunctivae are normal.  Neck: Normal range of motion. Neck supple.  Cardiovascular: Normal rate and normal heart sounds.   Pulmonary/Chest: Effort normal.  Abdominal: She exhibits no distension.  Musculoskeletal: Normal range of motion.  Lymphadenopathy:    She has no cervical adenopathy.  Neurological: She is alert and oriented to person, place, and time.  Skin: Skin is warm and dry. No erythema.  Psychiatric: She has a normal mood and affect.    ED Course  Procedures (including critical care time) Labs Reviewed - No data to display No results found. 1. Pain due to dental caries     MDM  Multiple cavities without gingival edema or  abscess.  Pt prescribed hydrocodone, encouraged she keep her appt with Dr. Manson Passey,  Also suggested getting no cancellation list in hope of getting appt moved up.  Pt agrees.  The patient appears reasonably screened and/or stabilized for discharge and I doubt any other medical condition or other [facility] requiring further screening, evaluation, or treatment in the ED at this time prior to discharge.   Burgess Amor, PA-C 04/13/13 [phone]

## 2013-04-13 NOTE — ED Provider Notes (Signed)
Medical screening examination/treatment/procedure(s) were performed by non-physician practitioner and as supervising physician I was immediately available for consultation/collaboration.  Jhoanna Heyde, MD 04/13/13 1538 

## 2013-04-26 ENCOUNTER — Encounter (HOSPITAL_COMMUNITY): Payer: Self-pay | Admitting: Emergency Medicine

## 2013-04-26 ENCOUNTER — Emergency Department (HOSPITAL_COMMUNITY)
Admission: EM | Admit: 2013-04-26 | Discharge: 2013-04-26 | Disposition: A | Discharge status: Home / Self Care | Payer: Medicaid Other | Attending: Emergency Medicine | Admitting: Emergency Medicine

## 2013-04-26 DIAGNOSIS — J45909 Unspecified asthma, uncomplicated: Secondary | ICD-10-CM | POA: Insufficient documentation

## 2013-04-26 DIAGNOSIS — F17200 Nicotine dependence, unspecified, uncomplicated: Secondary | ICD-10-CM | POA: Insufficient documentation

## 2013-04-26 DIAGNOSIS — Z8669 Personal history of other diseases of the nervous system and sense organs: Secondary | ICD-10-CM | POA: Insufficient documentation

## 2013-04-26 DIAGNOSIS — G8929 Other chronic pain: Secondary | ICD-10-CM | POA: Insufficient documentation

## 2013-04-26 DIAGNOSIS — Z791 Long term (current) use of non-steroidal anti-inflammatories (NSAID): Secondary | ICD-10-CM | POA: Insufficient documentation

## 2013-04-26 DIAGNOSIS — K029 Dental caries, unspecified: Secondary | ICD-10-CM

## 2013-04-26 MED ORDER — AMOXICILLIN 500 MG PO CAPS: 500.0000 mg | ORAL_CAPSULE | Freq: Three times a day (TID) | ORAL | Status: DC

## 2013-04-26 MED ORDER — HYDROCODONE-ACETAMINOPHEN 5-325 MG PO TABS: 1.0000 | ORAL_TABLET | ORAL | Status: DC | PRN

## 2013-04-26 NOTE — ED Provider Notes (Signed)
CSN: [id number]     Arrival date & time 04/26/13  1109 History     First MD Initiated Contact with Patient 04/26/13 1122     Chief Complaint  Patient presents with  . Dental Pain   (Consider location/radiation/quality/duration/timing/severity/associated sxs/prior Treatment) Patient is a 37 y.o. female presenting with tooth pain. The history is provided by the patient.  Dental Pain Location:  Lower and upper Upper teeth location:  9/LU central incisor Lower teeth location:  23/LL lateral incisor Associated symptoms: no fever and no headaches    Bonnie Jensen is a 37 y.o. female who presents to the ED with dental pain due to caries. She states that she went to a dentist and he drilled the upper tooth that is now hurting. The lower tooth has been hurting for 2 days also.  Past Medical History  Diagnosis Date  . Asthma   . Migraine   . Chronic abdominal pain   . Chronic back pain    Past Surgical History  Procedure Laterality Date  . Appendectomy    . Cholecystectomy    . Tonsillectomy    . Tubal ligation    . Brain surgery     No family history on file. History  Substance Use Topics  . Smoking status: Current Every Day Smoker -- 1.00 packs/day for 15 years    Types: Cigarettes  . Smokeless tobacco: Never Used  . Alcohol Use: No   OB History   Grav Para Term Preterm Abortions TAB SAB Ect Mult Living                 Review of Systems  Constitutional: Negative for fever and chills.  HENT: Positive for dental problem.   Respiratory: Negative for cough.   Gastrointestinal: Negative for nausea, vomiting and abdominal pain.  Musculoskeletal: Negative for back pain.  Skin: Negative for rash.  Neurological: Negative for headaches.  Psychiatric/Behavioral: The patient is not nervous/anxious.     Allergies  Bee venom; Ketorolac tromethamine; Imitrex; and Isometheptene-apap-dichloral  Home Medications   Current Outpatient Rx  Name  Route  Sig  Dispense  Refill  .  acetaminophen (TYLENOL) 500 MG tablet   Oral   Take 1,000 mg by mouth every 6 (six) hours as needed for pain.         Marland Kitchen HYDROcodone-acetaminophen (NORCO) 5-325 MG per tablet   Oral   Take 2 tablets by mouth every 4 (four) hours as needed for pain.   12 tablet   0   . HYDROcodone-acetaminophen (NORCO/VICODIN) 5-325 MG per tablet   Oral   Take 1 tablet by mouth every 4 (four) hours as needed.   20 tablet   0   . ibuprofen (ADVIL,MOTRIN) 200 MG tablet   Oral   Take 800 mg by mouth every 6 (six) hours as needed for pain.         Marland Kitchen ondansetron (ZOFRAN-ODT) 8 MG disintegrating tablet   Oral   Take 1 tablet (8 mg total) by mouth every 8 (eight) hours as needed for nausea.   20 tablet   0    BP 149/107  Pulse 112  Temp(Src) 98.2 F (36.8 C)  Resp 18  Ht 4\' 11"  (1.499 m)  Wt 178 lb (80.74 kg)  BMI 35.93 kg/m2  SpO2 100%  LMP 04/19/2013 Physical Exam  Nursing note and vitals reviewed. Constitutional: She is oriented to person, place, and time. She appears well-developed and well-nourished.  HENT:  Head: Normocephalic.  Mouth/Throat:  Uvula is midline and oropharynx is clear and moist. Dental caries present.    Eyes: Conjunctivae and EOM are normal.  Neck: Normal range of motion. Neck supple.  Cardiovascular: Normal rate, regular rhythm and normal heart sounds.   Pulmonary/Chest: Effort normal and breath sounds normal.  Musculoskeletal: Normal range of motion.  Lymphadenopathy:    She has no cervical adenopathy.  Neurological: She is alert and oriented to person, place, and time. No cranial nerve deficit.  Skin: Skin is warm and dry.  Psychiatric: She has a normal mood and affect. Her behavior is normal.    ED Course   Procedures   MDM  37 y.o. female with dental pain. Will treat with antibiotics and pain medication and she will follow up with with her dentist as soon as possible.  Discussed with the patient and all questioned fully answered.   Medication List     STOP taking these medications       acetaminophen 500 MG tablet  Commonly known as:  TYLENOL      TAKE these medications       amoxicillin 500 MG capsule  Commonly known as:  AMOXIL  Take 1 capsule (500 mg total) by mouth 3 (three) times daily.     HYDROcodone-acetaminophen 5-325 MG per tablet  Commonly known as:  NORCO/VICODIN  Take 1 tablet by mouth every 4 (four) hours as needed.      ASK your doctor about these medications       ibuprofen 200 MG tablet  Commonly known as:  ADVIL,MOTRIN  Take 400 mg by mouth every 6 (six) hours as needed for pain.         [place], [place] 04/26/13 [phone]

## 2013-04-26 NOTE — ED Notes (Signed)
Pt c/o left lower and upper front tooth ache x 2 days. Pt reports some nausea from pain. No relief from otc pain medication.  Denies abd pain/v/d.

## 2013-04-27 NOTE — ED Provider Notes (Signed)
Medical screening examination/treatment/procedure(s) were performed by non-physician practitioner and as supervising physician I was immediately available for consultation/collaboration.   Helane Briceno L Lilit Cinelli, MD 04/27/13 0840 

## 2013-05-11 ENCOUNTER — Emergency Department (HOSPITAL_COMMUNITY)
Admission: EM | Admit: 2013-05-11 | Discharge: 2013-05-11 | Disposition: A | Discharge status: Home / Self Care | Payer: Medicaid Other | Attending: Emergency Medicine | Admitting: Emergency Medicine

## 2013-05-11 ENCOUNTER — Encounter (HOSPITAL_COMMUNITY): Payer: Self-pay | Admitting: Emergency Medicine

## 2013-05-11 DIAGNOSIS — J45909 Unspecified asthma, uncomplicated: Secondary | ICD-10-CM | POA: Insufficient documentation

## 2013-05-11 DIAGNOSIS — M549 Dorsalgia, unspecified: Secondary | ICD-10-CM | POA: Insufficient documentation

## 2013-05-11 DIAGNOSIS — Z8679 Personal history of other diseases of the circulatory system: Secondary | ICD-10-CM | POA: Insufficient documentation

## 2013-05-11 DIAGNOSIS — R52 Pain, unspecified: Secondary | ICD-10-CM | POA: Insufficient documentation

## 2013-05-11 DIAGNOSIS — K089 Disorder of teeth and supporting structures, unspecified: Secondary | ICD-10-CM | POA: Insufficient documentation

## 2013-05-11 DIAGNOSIS — K029 Dental caries, unspecified: Secondary | ICD-10-CM

## 2013-05-11 DIAGNOSIS — F17200 Nicotine dependence, unspecified, uncomplicated: Secondary | ICD-10-CM | POA: Insufficient documentation

## 2013-05-11 DIAGNOSIS — K047 Periapical abscess without sinus: Secondary | ICD-10-CM | POA: Insufficient documentation

## 2013-05-11 DIAGNOSIS — G8929 Other chronic pain: Secondary | ICD-10-CM | POA: Insufficient documentation

## 2013-05-11 MED ORDER — HYDROCODONE-ACETAMINOPHEN 5-325 MG PO TABS
2.0000 | ORAL_TABLET | Freq: Once | ORAL | Status: AC
  Administered 2013-05-11: 2 via ORAL
  Filled 2013-05-11: qty 2

## 2013-05-11 MED ORDER — HYDROCODONE-ACETAMINOPHEN 5-325 MG PO TABS: 1.0000 | ORAL_TABLET | ORAL | Status: DC | PRN

## 2013-05-11 NOTE — ED Provider Notes (Signed)
CSN: [id number]     Arrival date & time 05/11/13  1403 History     First MD Initiated Contact with Patient 05/11/13 1411     Chief Complaint  Patient presents with  . Dental Pain   (Consider location/radiation/quality/duration/timing/severity/associated sxs/prior Treatment) HPI Comments: Bonnie Jensen is a 37 y.o. Female with acute on chronic dental pain.  She was seen by her dentist [facility]) the third week of July at which time he drilled through the cavity in her left upper incisor to allow for drainage of infection as she has an abscess at the base of this tooth, with the plan to extract the tooth once the infection is resolved.  She was placed on amoxil (here in the ed) on 04/26/13 which she has completed,  Was also placed on hydrocodone which was helpful,  But ran out several days ago.  Her follow up appt with her dentist is on 8/18.  She cannot eat, drink,  Even breathing room temperature air is painful. She denies drainage from the site and has no further swelling, also denies fevers or chills.     The history is provided by the patient.    Past Medical History  Diagnosis Date  . Asthma   . Migraine   . Chronic abdominal pain   . Chronic back pain    Past Surgical History  Procedure Laterality Date  . Appendectomy    . Cholecystectomy    . Tonsillectomy    . Tubal ligation    . Brain surgery     History reviewed. No pertinent family history. History  Substance Use Topics  . Smoking status: Current Every Day Smoker -- 1.00 packs/day for 15 years    Types: Cigarettes  . Smokeless tobacco: Never Used  . Alcohol Use: No   OB History   Grav Para Term Preterm Abortions TAB SAB Ect Mult Living                 Review of Systems  Constitutional: Negative for fever.  HENT: Positive for dental problem. Negative for sore throat, facial swelling, neck pain and neck stiffness.   Respiratory: Negative for shortness of breath.     Allergies  Bee venom; Ketorolac  tromethamine; Imitrex; and Isometheptene-apap-dichloral  Home Medications   Current Outpatient Rx  Name  Route  Sig  Dispense  Refill  . amoxicillin (AMOXIL) 500 MG capsule   Oral   Take 1 capsule (500 mg total) by mouth 3 (three) times daily.   21 capsule   0   . HYDROcodone-acetaminophen (NORCO/VICODIN) 5-325 MG per tablet   Oral   Take 1 tablet by mouth every 4 (four) hours as needed.   15 tablet   0   . HYDROcodone-acetaminophen (NORCO/VICODIN) 5-325 MG per tablet   Oral   Take 1 tablet by mouth every 4 (four) hours as needed for pain.   20 tablet   0   . ibuprofen (ADVIL,MOTRIN) 200 MG tablet   Oral   Take 400 mg by mouth every 6 (six) hours as needed for pain.           BP 150/99  Pulse 107  Temp(Src) 98.2 F (36.8 C) (Oral)  Resp 18  SpO2 99%  LMP 04/19/2013 Physical Exam  Constitutional: She is oriented to person, place, and time. She appears well-developed and well-nourished. No distress.  HENT:  Head: Normocephalic and atraumatic. No trismus in the jaw.  Right Ear: Tympanic membrane and external ear normal.  Left Ear: Tympanic membrane and external ear normal.  Mouth/Throat: Oropharynx is clear and moist and mucous membranes are normal. No oral lesions. Dental abscesses present.    Eyes: Conjunctivae are normal.  Neck: Normal range of motion. Neck supple.  Cardiovascular: Normal rate and normal heart sounds.   Pulmonary/Chest: Effort normal.  Abdominal: She exhibits no distension.  Musculoskeletal: Normal range of motion.  Lymphadenopathy:    She has no cervical adenopathy.  No adenopathy.  Neurological: She is alert and oriented to person, place, and time.  Skin: Skin is warm and dry. No erythema.  Psychiatric: She has a normal mood and affect.    ED Course   Procedures (including critical care time)  Labs Reviewed - No data to display No results found. 1. Pain due to dental caries     MDM  Pt prescribed hydrocodone,  Encouraged eating  / drinking room temp foods/fluids.  F/u with dentist as planned.  With recent infection and dentists plan to allow this tooth to drain,  Could not recommend dental putty.  There is no evidence for infection or abscess today.  Burgess Amor, PA-C 05/11/13 1430

## 2013-05-11 NOTE — ED Provider Notes (Signed)
Medical screening examination/treatment/procedure(s) were performed by non-physician practitioner and as supervising physician I was immediately available for consultation/collaboration. Siah Steely, MD, FACEP   Andrw Mcguirt L Chrystle Murillo, MD 05/11/13 1551 

## 2013-05-11 NOTE — ED Notes (Signed)
Pt c/o upper and lower left side dental pain x1 month. Pt states she is not able to see dentist until the 18th of this month.

## 2013-06-01 ENCOUNTER — Emergency Department (HOSPITAL_COMMUNITY): Payer: Medicaid Other

## 2013-06-01 ENCOUNTER — Encounter (HOSPITAL_COMMUNITY): Payer: Self-pay | Admitting: *Deleted

## 2013-06-01 ENCOUNTER — Emergency Department (HOSPITAL_COMMUNITY)
Admission: EM | Admit: 2013-06-01 | Discharge: 2013-06-01 | Disposition: A | Discharge status: Home / Self Care | Payer: Medicaid Other | Attending: Emergency Medicine | Admitting: Emergency Medicine

## 2013-06-01 DIAGNOSIS — S60222A Contusion of left hand, initial encounter: Secondary | ICD-10-CM

## 2013-06-01 DIAGNOSIS — J45909 Unspecified asthma, uncomplicated: Secondary | ICD-10-CM | POA: Insufficient documentation

## 2013-06-01 DIAGNOSIS — Z8679 Personal history of other diseases of the circulatory system: Secondary | ICD-10-CM | POA: Insufficient documentation

## 2013-06-01 DIAGNOSIS — W230XXA Caught, crushed, jammed, or pinched between moving objects, initial encounter: Secondary | ICD-10-CM | POA: Insufficient documentation

## 2013-06-01 DIAGNOSIS — S60229A Contusion of unspecified hand, initial encounter: Secondary | ICD-10-CM | POA: Insufficient documentation

## 2013-06-01 DIAGNOSIS — Y929 Unspecified place or not applicable: Secondary | ICD-10-CM | POA: Insufficient documentation

## 2013-06-01 DIAGNOSIS — F17200 Nicotine dependence, unspecified, uncomplicated: Secondary | ICD-10-CM | POA: Insufficient documentation

## 2013-06-01 DIAGNOSIS — Y9389 Activity, other specified: Secondary | ICD-10-CM | POA: Insufficient documentation

## 2013-06-01 MED ORDER — OXYCODONE-ACETAMINOPHEN 5-325 MG PO TABS
1.0000 | ORAL_TABLET | Freq: Once | ORAL | Status: AC
  Administered 2013-06-01: 1 via ORAL
  Filled 2013-06-01: qty 1

## 2013-06-01 NOTE — ED Notes (Signed)
Pt seen and evaluated by EDPa for initial assessment. 

## 2013-06-01 NOTE — ED Notes (Signed)
Moving freezer and partner let go, trapping her L hand between wall and freezer.  Tender to palpation, no obvious deformity.  Multiple scratches on hand.

## 2013-06-01 NOTE — ED Provider Notes (Signed)
CSN: [id number]     Arrival date & time 06/01/13  1230 History   First MD Initiated Contact with Patient 06/01/13 1251     Chief Complaint  Patient presents with  . Hand Pain   (Consider location/radiation/quality/duration/timing/severity/associated sxs/prior Treatment) Patient is a 37 y.o. female presenting with hand injury. The history is provided by the patient.  Hand Injury Location:  Hand Time since incident: just prior to ED arrival. Injury: yes   Mechanism of injury: crush   Mechanism of injury comment:  Crush injury to left hand that occurred while trying to move a freezer.  Hand location:  Dorsum of L hand Pain details:    Quality:  Aching and throbbing   Radiates to:  Does not radiate   Severity:  Mild   Onset quality:  Sudden   Duration:  1 hour   Timing:  Constant   Progression:  Unchanged Chronicity:  New Handedness:  Left-handed Dislocation: no   Foreign body present:  No foreign bodies Prior injury to area:  Yes Relieved by:  Nothing Worsened by:  Movement Ineffective treatments:  None tried Associated symptoms: no decreased range of motion, no fever, no muscle weakness, no numbness, no stiffness, no swelling and no tingling     Past Medical History  Diagnosis Date  . Asthma   . Migraine   . Chronic abdominal pain   . Chronic back pain    Past Surgical History  Procedure Laterality Date  . Appendectomy    . Cholecystectomy    . Tonsillectomy    . Tubal ligation    . Brain surgery     History reviewed. No pertinent family history. History  Substance Use Topics  . Smoking status: Current Every Day Smoker -- 1.00 packs/day for 15 years    Types: Cigarettes  . Smokeless tobacco: Never Used  . Alcohol Use: No   OB History   Grav Para Term Preterm Abortions TAB SAB Ect Mult Living                 Review of Systems  Constitutional: Negative for fever.  HENT: Negative for trouble swallowing.   Respiratory: Negative for shortness of breath.    Cardiovascular: Negative for chest pain.  Gastrointestinal: Negative for nausea and vomiting.  Genitourinary: Negative for dysuria and difficulty urinating.  Musculoskeletal: Positive for arthralgias. Negative for joint swelling and stiffness.  Skin: Negative for color change, rash and wound.  Neurological: Negative for dizziness, facial asymmetry, speech difficulty, weakness, numbness and headaches.  Psychiatric/Behavioral: Negative for confusion and decreased concentration.  All other systems reviewed and are negative.    Allergies  Bee venom; Ketorolac tromethamine; Imitrex; and Isometheptene-apap-dichloral  Home Medications   Current Outpatient Rx  Name  Route  Sig  Dispense  Refill  . amoxicillin (AMOXIL) 500 MG capsule   Oral   Take 1 capsule (500 mg total) by mouth 3 (three) times daily.   21 capsule   0   . HYDROcodone-acetaminophen (NORCO/VICODIN) 5-325 MG per tablet   Oral   Take 1 tablet by mouth every 4 (four) hours as needed.   15 tablet   0   . HYDROcodone-acetaminophen (NORCO/VICODIN) 5-325 MG per tablet   Oral   Take 1 tablet by mouth every 4 (four) hours as needed for pain.   20 tablet   0   . ibuprofen (ADVIL,MOTRIN) 200 MG tablet   Oral   Take 400 mg by mouth every 6 (six) hours as needed for  pain.           BP 124/84  Pulse 85  Temp(Src) 98.5 F (36.9 C) (Oral)  Resp 23  Ht 4\' 11"  (1.499 m)  Wt 183 lb (83.008 kg)  BMI 36.94 kg/m2  SpO2 98%  LMP 05/20/2013 Physical Exam  Nursing note and vitals reviewed. Constitutional: She is oriented to person, place, and time. She appears well-developed and well-nourished. No distress.  HENT:  Head: Normocephalic and atraumatic.  Cardiovascular: Normal rate, regular rhythm, normal heart sounds and intact distal pulses.   No murmur heard. Pulmonary/Chest: Effort normal and breath sounds normal. No respiratory distress.  Musculoskeletal: She exhibits tenderness. She exhibits no edema.  Diffuse ttp of  the dorsal left hand.  Few scattered, superficial abrasions present.  Radial pulse is brisk, distal sensation intact.  CR< 2 sec.  No bruising or bony deformity.  Patient has full ROM. Compartments soft. Left wrist and elbow are non-tender.  Neurological: She is alert and oriented to person, place, and time. She exhibits normal muscle tone. Coordination normal.  Skin: Skin is warm and dry.    ED Course  Procedures (including critical care time) Labs Review Labs Reviewed - No data to display Imaging Review Dg Hand Complete Left  06/01/2013   *RADIOLOGY REPORT*  Clinical Data: Hand pain following traumatic injury  LEFT HAND - COMPLETE 3+ VIEW  Comparison: 05/27/2013  Findings: No acute fracture or dislocation is noted.  No gross soft tissue abnormality is seen.  IMPRESSION: No acute abnormality noted.   Original Report Authenticated By: Alcide Clever, M.D.    MDM   Patient reviewed on the [place] narcotic database.  Has recent narcotics filled.    X-ray finding discussed.  Pt agrees to RICE therapy, velcro forearm splint applied.  Pain improved, remains NV intact.  No clinical evidence of compartment syndrome . Pt agrees to ortho f/u , referral info given.  Appears stable for discharge.   Avryl Roehm L. Brooklynne Pereida, PA-C 06/03/13 0840

## 2013-06-03 NOTE — ED Provider Notes (Signed)
Medical screening examination/treatment/procedure(s) were performed by non-physician practitioner and as supervising physician I was immediately available for consultation/collaboration.   Pairlee Sawtell L Chayson Charters, MD 06/03/13 2156 

## 2013-06-04 ENCOUNTER — Encounter (HOSPITAL_COMMUNITY): Payer: Self-pay | Admitting: *Deleted

## 2013-06-04 ENCOUNTER — Emergency Department (HOSPITAL_COMMUNITY)
Admission: EM | Admit: 2013-06-04 | Discharge: 2013-06-04 | Discharge status: Against Medical Advice | Payer: Medicaid Other | Attending: Emergency Medicine | Admitting: Emergency Medicine

## 2013-06-04 DIAGNOSIS — G8929 Other chronic pain: Secondary | ICD-10-CM | POA: Insufficient documentation

## 2013-06-04 DIAGNOSIS — K089 Disorder of teeth and supporting structures, unspecified: Secondary | ICD-10-CM | POA: Insufficient documentation

## 2013-06-04 DIAGNOSIS — M549 Dorsalgia, unspecified: Secondary | ICD-10-CM | POA: Insufficient documentation

## 2013-06-04 DIAGNOSIS — J45909 Unspecified asthma, uncomplicated: Secondary | ICD-10-CM | POA: Insufficient documentation

## 2013-06-04 DIAGNOSIS — F17200 Nicotine dependence, unspecified, uncomplicated: Secondary | ICD-10-CM | POA: Insufficient documentation

## 2013-06-04 NOTE — ED Notes (Signed)
Pt called from waiting room x3 with no answer

## 2013-06-04 NOTE — ED Notes (Signed)
Pt c/o dental pain to several teeth. Pt c/o vomiting x 2 . Can't see dentist until Sept. 22nd.

## 2013-06-30 ENCOUNTER — Emergency Department (HOSPITAL_COMMUNITY)
Admission: EM | Admit: 2013-06-30 | Discharge: 2013-06-30 | Disposition: A | Discharge status: Home / Self Care | Payer: Medicaid Other | Attending: Emergency Medicine | Admitting: Emergency Medicine

## 2013-06-30 ENCOUNTER — Encounter (HOSPITAL_COMMUNITY): Payer: Self-pay | Admitting: Emergency Medicine

## 2013-06-30 DIAGNOSIS — Z792 Long term (current) use of antibiotics: Secondary | ICD-10-CM | POA: Insufficient documentation

## 2013-06-30 DIAGNOSIS — M545 Low back pain, unspecified: Secondary | ICD-10-CM | POA: Insufficient documentation

## 2013-06-30 DIAGNOSIS — M79609 Pain in unspecified limb: Secondary | ICD-10-CM | POA: Insufficient documentation

## 2013-06-30 DIAGNOSIS — M549 Dorsalgia, unspecified: Secondary | ICD-10-CM

## 2013-06-30 DIAGNOSIS — J45909 Unspecified asthma, uncomplicated: Secondary | ICD-10-CM | POA: Insufficient documentation

## 2013-06-30 DIAGNOSIS — G8929 Other chronic pain: Secondary | ICD-10-CM | POA: Insufficient documentation

## 2013-06-30 DIAGNOSIS — R209 Unspecified disturbances of skin sensation: Secondary | ICD-10-CM | POA: Insufficient documentation

## 2013-06-30 DIAGNOSIS — Z8679 Personal history of other diseases of the circulatory system: Secondary | ICD-10-CM | POA: Insufficient documentation

## 2013-06-30 DIAGNOSIS — F17200 Nicotine dependence, unspecified, uncomplicated: Secondary | ICD-10-CM | POA: Insufficient documentation

## 2013-06-30 MED ORDER — NAPROXEN 500 MG PO TABS: 500.0000 mg | ORAL_TABLET | Freq: Two times a day (BID) | ORAL | Status: DC

## 2013-06-30 MED ORDER — HYDROCODONE-ACETAMINOPHEN 5-325 MG PO TABS
2.0000 | ORAL_TABLET | Freq: Once | ORAL | Status: AC
  Administered 2013-06-30: 2 via ORAL
  Filled 2013-06-30: qty 2

## 2013-06-30 MED ORDER — DEXAMETHASONE SODIUM PHOSPHATE 10 MG/ML IJ SOLN
10.0000 mg | Freq: Once | INTRAMUSCULAR | Status: AC
  Administered 2013-06-30: 10 mg via INTRAMUSCULAR
  Filled 2013-06-30: qty 1

## 2013-06-30 MED ORDER — HYDROCODONE-ACETAMINOPHEN 5-325 MG PO TABS: 2.0000 | ORAL_TABLET | ORAL | Status: DC | PRN

## 2013-06-30 NOTE — ED Notes (Signed)
Patient c/o lower back pain that radiates down her right leg, causing some numbness in right leg x 3-4 days.

## 2013-06-30 NOTE — ED Provider Notes (Signed)
CSN: [id number]     Arrival date & time 06/30/13  0507 History   First MD Initiated Contact with Patient 06/30/13 [phone]     Chief Complaint  Patient presents with  . Back Pain  . Leg Pain   (Consider location/radiation/quality/duration/timing/severity/associated sxs/prior Treatment) HPI Comments: 37 year old female with a history of asthma, chronic back pain and chronic abdominal pain. She presents to the hospital with what she states is back pain that got worse 3 days ago. She was lifting a basket of close when she felt the acute onset of lower back pain which is a twisting pain that goes all the way across her back from the left side to the right side. This is not a directional pain that is constant. It seems to get better when she is in a sitting position, worsens when she lays flat or when she is walking. She has no pain radiating down her leg but does notice intermittent numbness over the anterior and lateral and medial surfaces of her right lower extremity above the knee. She has no symptoms below the knee, no weakness, no dysuria, no hematuria, no urinary frequency, no urinary incontinence or retention. She has no history of cancer, no history of IV drug use, she is only 37 years old. She does state that she has had frequent bouts of back pain the past but this seems to be more intense than at that time. She has used ibuprofen and Tylenol at home without any improvement.  Patient is a 37 y.o. female presenting with back pain and leg pain. The history is provided by the patient.  Back Pain Associated symptoms: leg pain and numbness   Associated symptoms: no fever and no weakness   Leg Pain Associated symptoms: back pain   Associated symptoms: no fever and no neck pain     Past Medical History  Diagnosis Date  . Asthma   . Migraine   . Chronic abdominal pain   . Chronic back pain    Past Surgical History  Procedure Laterality Date  . Appendectomy    . Cholecystectomy    .  Tonsillectomy    . Tubal ligation    . Brain surgery     No family history on file. History  Substance Use Topics  . Smoking status: Current Every Day Smoker -- 1.00 packs/day for 15 years    Types: Cigarettes  . Smokeless tobacco: Never Used  . Alcohol Use: No   OB History   Grav Para Term Preterm Abortions TAB SAB Ect Mult Living                 Review of Systems  Constitutional: Negative for fever and chills.  HENT: Negative for neck pain.   Cardiovascular: Negative for leg swelling.  Gastrointestinal: Negative for nausea and vomiting.       No incontinence of bowel  Genitourinary: Negative for difficulty urinating.       No incontinence or retention  Musculoskeletal: Positive for back pain.  Skin: Negative for rash.  Neurological: Positive for numbness. Negative for weakness.    Allergies  Bee venom; Ketorolac tromethamine; Imitrex; and Isometheptene-apap-dichloral  Home Medications   Current Outpatient Rx  Name  Route  Sig  Dispense  Refill  . amoxicillin (AMOXIL) 500 MG capsule   Oral   Take 1 capsule (500 mg total) by mouth 3 (three) times daily.   21 capsule   0   . ibuprofen (ADVIL,MOTRIN) 200 MG tablet   Oral  Take 400 mg by mouth every 6 (six) hours as needed for pain.          Marland Kitchen HYDROcodone-acetaminophen (NORCO/VICODIN) 5-325 MG per tablet   Oral   Take 1 tablet by mouth every 4 (four) hours as needed.   15 tablet   0   . HYDROcodone-acetaminophen (NORCO/VICODIN) 5-325 MG per tablet   Oral   Take 1 tablet by mouth every 4 (four) hours as needed for pain.   20 tablet   0   . HYDROcodone-acetaminophen (NORCO/VICODIN) 5-325 MG per tablet   Oral   Take 2 tablets by mouth every 4 (four) hours as needed for pain.   6 tablet   0   . naproxen (NAPROSYN) 500 MG tablet   Oral   Take 1 tablet (500 mg total) by mouth 2 (two) times daily with a meal.   30 tablet   0    BP 146/93  Pulse 91  Temp(Src) 97.9 F (36.6 C) (Oral)  Resp 20  Ht 4'  11" (1.499 m)  Wt 180 lb (81.647 kg)  BMI 36.34 kg/m2  SpO2 97%  LMP 06/29/2013 Physical Exam  Nursing note and vitals reviewed. Constitutional: She appears well-developed and well-nourished. No distress.  HENT:  Head: Normocephalic and atraumatic.  Eyes: Conjunctivae are normal. No scleral icterus.  Cardiovascular: Normal rate, regular rhythm and intact distal pulses.   Pulmonary/Chest: Effort normal and breath sounds normal.  Musculoskeletal: She exhibits no edema.  No focal tenderness to palpation over the spinal bones of the lumbar spine.  Neurological: She is alert.  The patient has a normal gait, normal strength of bilateral lower extremities and can straight leg raise, flex and extend at the hip, flex and extend at the knee, flex and extend at the ankle without difficulty. She has normal sensation to light touch and pinprick of the right lower extremity as well as the left lower extremity and has symmetrical reflexes at the patellar tendons bilaterally  Skin: Skin is warm and dry. No rash noted. She is not diaphoretic.    ED Course  Procedures (including critical care time) Labs Review Labs Reviewed - No data to display Imaging Review No results found.  MDM   1. Back pain    The patient is neurologically intact, she has no signs of true sciatica, she has no numbness on my exam and admits that at this time she does not have any numbness objectively either. There is no strength deficits, she has no urinary symptoms and no other pathologic red flags for back pain, she will be given Decadron and pain medication will be discharged safely home. 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 She'll also be given family doctor followup and neurosurgical referral should her symptoms last beyond 30 days, the patient has agreed and is amenable to discharge under these  conditions.   Meds given in ED:  Medications  dexamethasone (DECADRON) injection 10 mg (not administered)  HYDROcodone-acetaminophen (NORCO/VICODIN) 5-325 MG per tablet 2 tablet (not administered)    New Prescriptions   HYDROCODONE-ACETAMINOPHEN (NORCO/VICODIN) 5-325 MG PER TABLET    Take 2 tablets by mouth every 4 (four) hours as needed for pain.   NAPROXEN (NAPROSYN) 500 MG TABLET    Take 1 tablet (500 mg total) by mouth 2 (two) times daily with a meal.      Vida Roller, MD 06/30/13  0538 

## 2013-08-16 ENCOUNTER — Emergency Department (HOSPITAL_COMMUNITY)
Admission: EM | Admit: 2013-08-16 | Discharge: 2013-08-17 | Disposition: A | Discharge status: Home / Self Care | Payer: Medicaid Other | Attending: Emergency Medicine | Admitting: Emergency Medicine

## 2013-08-16 ENCOUNTER — Encounter (HOSPITAL_COMMUNITY): Payer: Self-pay | Admitting: Emergency Medicine

## 2013-08-16 DIAGNOSIS — K0889 Other specified disorders of teeth and supporting structures: Secondary | ICD-10-CM

## 2013-08-16 DIAGNOSIS — R51 Headache: Secondary | ICD-10-CM | POA: Insufficient documentation

## 2013-08-16 DIAGNOSIS — J45909 Unspecified asthma, uncomplicated: Secondary | ICD-10-CM | POA: Insufficient documentation

## 2013-08-16 DIAGNOSIS — G8929 Other chronic pain: Secondary | ICD-10-CM | POA: Insufficient documentation

## 2013-08-16 DIAGNOSIS — R011 Cardiac murmur, unspecified: Secondary | ICD-10-CM | POA: Insufficient documentation

## 2013-08-16 DIAGNOSIS — F17200 Nicotine dependence, unspecified, uncomplicated: Secondary | ICD-10-CM | POA: Insufficient documentation

## 2013-08-16 DIAGNOSIS — Z8679 Personal history of other diseases of the circulatory system: Secondary | ICD-10-CM | POA: Insufficient documentation

## 2013-08-16 DIAGNOSIS — K089 Disorder of teeth and supporting structures, unspecified: Secondary | ICD-10-CM | POA: Insufficient documentation

## 2013-08-16 MED ORDER — AMOXICILLIN 500 MG PO CAPS: 500.0000 mg | ORAL_CAPSULE | Freq: Three times a day (TID) | ORAL | Status: DC

## 2013-08-16 MED ORDER — TRAMADOL HCL 50 MG PO TABS: ORAL_TABLET | ORAL | Status: DC

## 2013-08-16 MED ORDER — IBUPROFEN 800 MG PO TABS: 800.0000 mg | ORAL_TABLET | Freq: Three times a day (TID) | ORAL | Status: DC

## 2013-08-16 MED ORDER — TRAMADOL HCL 50 MG PO TABS
100.0000 mg | ORAL_TABLET | Freq: Once | ORAL | Status: DC
  Filled 2013-08-16: qty 2

## 2013-08-16 MED ORDER — PROMETHAZINE HCL 12.5 MG PO TABS
12.5000 mg | ORAL_TABLET | Freq: Once | ORAL | Status: AC
  Administered 2013-08-16: 12.5 mg via ORAL
  Filled 2013-08-16: qty 1

## 2013-08-16 MED ORDER — PENICILLIN V POTASSIUM 250 MG PO TABS
500.0000 mg | ORAL_TABLET | Freq: Once | ORAL | Status: AC
  Administered 2013-08-16: 500 mg via ORAL
  Filled 2013-08-16: qty 2

## 2013-08-16 MED ORDER — IBUPROFEN 800 MG PO TABS
800.0000 mg | ORAL_TABLET | Freq: Once | ORAL | Status: DC
  Filled 2013-08-16: qty 1

## 2013-08-16 NOTE — ED Notes (Signed)
Mult dental caries with pain, and headache.

## 2013-08-16 NOTE — ED Provider Notes (Signed)
CSN: [id number]     Arrival date & time 08/16/13  2259 History   First MD Initiated Contact with Patient 08/16/13 2305     Chief Complaint  Patient presents with  . Dental Pain   (Consider location/radiation/quality/duration/timing/severity/associated sxs/prior Treatment) Patient is a 37 y.o. female presenting with tooth pain. The history is provided by the patient.  Dental Pain Quality:  Aching and throbbing Severity:  Moderate Onset quality:  Gradual Duration:  4 days Timing:  Intermittent Progression:  Worsening Chronicity:  Chronic Context: poor dentition   Relieved by:  Nothing Ineffective treatments:  NSAIDs and topical anesthetic gel Associated symptoms: headaches   Associated symptoms: no neck pain   Risk factors: smoking     Past Medical History  Diagnosis Date  . Asthma   . Migraine   . Chronic abdominal pain   . Chronic back pain    Past Surgical History  Procedure Laterality Date  . Appendectomy    . Cholecystectomy    . Tonsillectomy    . Tubal ligation    . Brain surgery     History reviewed. No pertinent family history. History  Substance Use Topics  . Smoking status: Current Every Day Smoker -- 1.00 packs/day for 15 years    Types: Cigarettes  . Smokeless tobacco: Never Used  . Alcohol Use: No   OB History   Grav Para Term Preterm Abortions TAB SAB Ect Mult Living                 Review of Systems  Constitutional: Negative for activity change.       All ROS Neg except as noted in HPI  HENT: Positive for dental problem. Negative for nosebleeds.   Eyes: Negative for photophobia and discharge.  Respiratory: Negative for cough, shortness of breath and wheezing.   Cardiovascular: Negative for chest pain and palpitations.  Gastrointestinal: Negative for abdominal pain and blood in stool.  Genitourinary: Negative for dysuria, frequency and hematuria.  Musculoskeletal: Negative for arthralgias, back pain and neck pain.  Skin: Negative.    Neurological: Positive for headaches. Negative for dizziness, seizures and speech difficulty.  Psychiatric/Behavioral: Negative for hallucinations and confusion.    Allergies  Bee venom; Ketorolac tromethamine; Imitrex; Isometheptene-apap-dichloral; and Tylenol with codeine #3  Home Medications   Current Outpatient Rx  Name  Route  Sig  Dispense  Refill  . amoxicillin (AMOXIL) 500 MG capsule   Oral   Take 1 capsule (500 mg total) by mouth 3 (three) times daily.   21 capsule   0   . HYDROcodone-acetaminophen (NORCO/VICODIN) 5-325 MG per tablet   Oral   Take 1 tablet by mouth every 4 (four) hours as needed.   15 tablet   0   . HYDROcodone-acetaminophen (NORCO/VICODIN) 5-325 MG per tablet   Oral   Take 1 tablet by mouth every 4 (four) hours as needed for pain.   20 tablet   0   . HYDROcodone-acetaminophen (NORCO/VICODIN) 5-325 MG per tablet   Oral   Take 2 tablets by mouth every 4 (four) hours as needed for pain.   6 tablet   0   . ibuprofen (ADVIL,MOTRIN) 200 MG tablet   Oral   Take 400 mg by mouth every 6 (six) hours as needed for pain.          . naproxen (NAPROSYN) 500 MG tablet   Oral   Take 1 tablet (500 mg total) by mouth 2 (two) times daily with a meal.  30 tablet   0    BP 105/92  Pulse 94  Temp(Src) 97.5 F (36.4 C) (Oral)  Resp 18  Ht 5' (1.524 m)  Wt 175 lb (79.379 kg)  BMI 34.18 kg/m2  SpO2 100%  LMP 08/09/2013 Physical Exam  Nursing note and vitals reviewed. Constitutional: She is oriented to person, place, and time. She appears well-developed and well-nourished.  Non-toxic appearance.  HENT:  Head: Normocephalic.  Right Ear: Tympanic membrane and external ear normal.  Left Ear: Tympanic membrane and external ear normal.  Will there is the upper and lower jaw. No visible abscess. Extensive gum disease present. The airway is patent. No swelling under the tongue.  Eyes: EOM and lids are normal. Pupils are equal, round, and reactive to  light.  Neck: Normal range of motion. Neck supple. Carotid bruit is not present.  Cardiovascular: Normal rate, regular rhythm, intact distal pulses and normal pulses.   Murmur heard. 2/6 systolic murmur  Pulmonary/Chest: Breath sounds normal. No respiratory distress.  Abdominal: Soft. Bowel sounds are normal. There is no tenderness. There is no guarding.  Musculoskeletal: Normal range of motion.  Lymphadenopathy:       Head (right side): No submandibular adenopathy present.       Head (left side): No submandibular adenopathy present.    She has no cervical adenopathy.  Neurological: She is alert and oriented to person, place, and time. She has normal strength. No cranial nerve deficit or sensory deficit.  Skin: Skin is warm and dry.  Psychiatric: She has a normal mood and affect. Her speech is normal.    ED Course  Procedures (including critical care time) Labs Review Labs Reviewed - No data to display Imaging Review No results found.  EKG Interpretation   None       MDM  No diagnosis found. *I have reviewed nursing notes, vital signs, and all appropriate lab and imaging results for this patient.**   Patient states she has an appointment for November 26 to have her teeth removed. Vital signs stable. Pulse oximetry 100% oral air. Within normal limits by my interpretation.  Prescription for Ultram, ibuprofen, and Amoxil given to the patient.  Kathie Dike, PA-C 08/16/13 [phone]

## 2013-08-17 NOTE — ED Notes (Signed)
Patient states that we can throw away the prescriptions for ibuprofen and amoxicillin. States that she has been taking PCN at home and that she does not need another antibiotic. States that the ibuprofen does not help and she guesses they will write for her something different when it eats a hole out in her stomach. Patient signed out and left without taking her prescriptions.

## 2013-08-17 NOTE — ED Provider Notes (Signed)
Medical screening examination/treatment/procedure(s) were performed by non-physician practitioner and as supervising physician I was immediately available for consultation/collaboration.  Eastin Swing, MD 08/17/13 2237 

## 2013-10-27 ENCOUNTER — Encounter (HOSPITAL_COMMUNITY): Payer: Self-pay | Admitting: Emergency Medicine

## 2013-10-27 ENCOUNTER — Emergency Department (HOSPITAL_COMMUNITY)
Admission: EM | Admit: 2013-10-27 | Discharge: 2013-10-27 | Disposition: A | Discharge status: Home / Self Care | Payer: Medicaid Other | Attending: Emergency Medicine | Admitting: Emergency Medicine

## 2013-10-27 DIAGNOSIS — R1084 Generalized abdominal pain: Secondary | ICD-10-CM | POA: Insufficient documentation

## 2013-10-27 DIAGNOSIS — J45909 Unspecified asthma, uncomplicated: Secondary | ICD-10-CM | POA: Insufficient documentation

## 2013-10-27 DIAGNOSIS — Z3202 Encounter for pregnancy test, result negative: Secondary | ICD-10-CM | POA: Insufficient documentation

## 2013-10-27 DIAGNOSIS — R109 Unspecified abdominal pain: Secondary | ICD-10-CM

## 2013-10-27 DIAGNOSIS — F17200 Nicotine dependence, unspecified, uncomplicated: Secondary | ICD-10-CM | POA: Insufficient documentation

## 2013-10-27 DIAGNOSIS — G8929 Other chronic pain: Secondary | ICD-10-CM | POA: Insufficient documentation

## 2013-10-27 DIAGNOSIS — Z9851 Tubal ligation status: Secondary | ICD-10-CM | POA: Insufficient documentation

## 2013-10-27 DIAGNOSIS — Z9089 Acquired absence of other organs: Secondary | ICD-10-CM | POA: Insufficient documentation

## 2013-10-27 DIAGNOSIS — Z8679 Personal history of other diseases of the circulatory system: Secondary | ICD-10-CM | POA: Insufficient documentation

## 2013-10-27 LAB — CBC WITH DIFFERENTIAL/PLATELET
Basophils Absolute: 0 10*3/uL (ref 0.0–0.1)
Basophils Relative: 0 % (ref 0–1)
EOS ABS: 0.1 10*3/uL (ref 0.0–0.7)
Eosinophils Relative: 1 % (ref 0–5)
HCT: 43.7 % (ref 36.0–46.0)
HEMOGLOBIN: 15.1 g/dL — AB (ref 12.0–15.0)
LYMPHS ABS: 1.8 10*3/uL (ref 0.7–4.0)
LYMPHS PCT: 15 % (ref 12–46)
MCH: 33 pg (ref 26.0–34.0)
MCHC: 34.6 g/dL (ref 30.0–36.0)
MCV: 95.6 fL (ref 78.0–100.0)
MONOS PCT: 6 % (ref 3–12)
Monocytes Absolute: 0.7 10*3/uL (ref 0.1–1.0)
NEUTROS PCT: 79 % — AB (ref 43–77)
Neutro Abs: 9.6 10*3/uL — ABNORMAL HIGH (ref 1.7–7.7)
Platelets: 336 10*3/uL (ref 150–400)
RBC: 4.57 MIL/uL (ref 3.87–5.11)
RDW: 13.8 % (ref 11.5–15.5)
WBC: 12.2 10*3/uL — AB (ref 4.0–10.5)

## 2013-10-27 LAB — URINE MICROSCOPIC-ADD ON

## 2013-10-27 LAB — COMPREHENSIVE METABOLIC PANEL
ALK PHOS: 70 U/L (ref 39–117)
ALT: 16 U/L (ref 0–35)
AST: 15 U/L (ref 0–37)
Albumin: 4.2 g/dL (ref 3.5–5.2)
BILIRUBIN TOTAL: 0.8 mg/dL (ref 0.3–1.2)
BUN: 10 mg/dL (ref 6–23)
CHLORIDE: 100 meq/L (ref 96–112)
CO2: 23 meq/L (ref 19–32)
Calcium: 9.7 mg/dL (ref 8.4–10.5)
Creatinine, Ser: 0.72 mg/dL (ref 0.50–1.10)
GLUCOSE: 97 mg/dL (ref 70–99)
POTASSIUM: 3.9 meq/L (ref 3.7–5.3)
SODIUM: 138 meq/L (ref 137–147)
TOTAL PROTEIN: 8.7 g/dL — AB (ref 6.0–8.3)

## 2013-10-27 LAB — URINALYSIS, ROUTINE W REFLEX MICROSCOPIC
Glucose, UA: NEGATIVE mg/dL
KETONES UR: 15 mg/dL — AB
Leukocytes, UA: NEGATIVE
NITRITE: NEGATIVE
PROTEIN: 30 mg/dL — AB
UROBILINOGEN UA: 0.2 mg/dL (ref 0.0–1.0)
pH: 5.5 (ref 5.0–8.0)

## 2013-10-27 LAB — PREGNANCY, URINE: Preg Test, Ur: NEGATIVE

## 2013-10-27 MED ORDER — OXYCODONE-ACETAMINOPHEN 5-325 MG PO TABS
1.0000 | ORAL_TABLET | Freq: Once | ORAL | Status: AC
  Administered 2013-10-27: 1 via ORAL
  Filled 2013-10-27: qty 1

## 2013-10-27 MED ORDER — ONDANSETRON 8 MG PO TBDP
8.0000 mg | ORAL_TABLET | Freq: Once | ORAL | Status: AC
  Administered 2013-10-27: 8 mg via ORAL
  Filled 2013-10-27: qty 1

## 2013-10-27 MED ORDER — ONDANSETRON HCL 8 MG PO TABS: 8.0000 mg | ORAL_TABLET | Freq: Three times a day (TID) | ORAL | Status: DC | PRN

## 2013-10-27 NOTE — ED Notes (Signed)
Patient with no complaints at this time. Respirations even and unlabored. Skin warm/dry. Discharge instructions reviewed with patient at this time. Patient given opportunity to voice concerns/ask questions. Patient discharged at this time and left Emergency Department with steady gait.   

## 2013-10-27 NOTE — Discharge Instructions (Signed)

## 2013-10-27 NOTE — ED Notes (Signed)
Pt c/o back and abd pain with n/v that started yesterday, denies any urinary symptoms,

## 2013-10-27 NOTE — ED Provider Notes (Signed)
CSN: [id number]     Arrival date & time 10/27/13  1537 History   First MD Initiated Contact with Patient 10/27/13 1656     Chief Complaint  Patient presents with  . Abdominal Pain    Patient is a 38 y.o. female presenting with abdominal pain. The history is provided by the patient.  Abdominal Pain Pain location:  Generalized Pain quality: aching   Pain severity:  Moderate Onset quality:  Gradual Duration:  1 day Timing:  Constant Progression:  Unchanged Chronicity:  Recurrent Relieved by:  Nothing Worsened by:  Nothing tried Associated symptoms: nausea and vomiting   Associated symptoms: no chest pain, no constipation, no cough, no diarrhea, no dysuria, no fever, no hematemesis, no vaginal bleeding and no vaginal discharge     Past Medical History  Diagnosis Date  . Asthma   . Migraine   . Chronic abdominal pain   . Chronic back pain    Past Surgical History  Procedure Laterality Date  . Appendectomy    . Cholecystectomy    . Tonsillectomy    . Tubal ligation    . Brain surgery     No family history on file. History  Substance Use Topics  . Smoking status: Current Every Day Smoker -- 1.00 packs/day for 15 years    Types: Cigarettes  . Smokeless tobacco: Never Used  . Alcohol Use: No   OB History   Grav Para Term Preterm Abortions TAB SAB Ect Mult Living                 Review of Systems  Constitutional: Negative for fever.  Respiratory: Negative for cough.   Cardiovascular: Negative for chest pain.  Gastrointestinal: Positive for nausea, vomiting and abdominal pain. Negative for diarrhea, constipation, blood in stool and hematemesis.  Genitourinary: Negative for dysuria, vaginal bleeding and vaginal discharge.  All other systems reviewed and are negative.    Allergies  Bee venom; Ketorolac tromethamine; Imitrex; Isometheptene-apap-dichloral; and Tylenol with codeine #3  Home Medications   Current Outpatient Rx  Name  Route  Sig  Dispense  Refill  .  acetaminophen (TYLENOL) 500 MG tablet   Oral   Take 500 mg by mouth every 6 (six) hours as needed.          BP 148/92  Pulse 98  Temp(Src) 98.3 F (36.8 C) (Oral)  Resp 20  Ht 4\' 11"  (1.499 m)  Wt 172 lb 8 oz (78.245 kg)  BMI 34.82 kg/m2  SpO2 98%  LMP 10/20/2013 Physical Exam CONSTITUTIONAL: Well developed/well nourished HEAD: Normocephalic/atraumatic EYES: EOMI/PERRL, no icterus ENMT: Mucous membranes moist NECK: supple no meningeal signs SPINE:entire spine nontender CV: S1/S2 noted, no murmurs/rubs/gallops noted LUNGS: Lungs are clear to auscultation bilaterally, no apparent distress ABDOMEN: soft, nontender, no rebound or guarding GU:no cva tenderness NEURO: Pt is awake/alert, moves all extremitiesx4 EXTREMITIES: pulses normal, full ROM SKIN: warm, color normal PSYCH: no abnormalities of mood noted  ED Course  Procedures (including critical care time) 6:06 PM Her abdominal exam is unremarkable.  She is well appearing Suspicion for acute abd process is low 6:28 PM WBC elevated though this has been seen previously I doubt acute abdominal process at this time (s/p cholecystectomy and appendectomy) I doubt acute gynecologic emergency Stable for d/c home We discussed strict return precautions  Labs Review Labs Reviewed  CBC WITH DIFFERENTIAL - Abnormal; Notable for the following:    WBC 12.2 (*)    Hemoglobin 15.1 (*)    Neutrophils  Relative % 79 (*)    Neutro Abs 9.6 (*)    All other components within normal limits  COMPREHENSIVE METABOLIC PANEL - Abnormal; Notable for the following:    Total Protein 8.7 (*)    All other components within normal limits  URINALYSIS, ROUTINE W REFLEX MICROSCOPIC - Abnormal; Notable for the following:    Color, Urine AMBER (*)    APPearance CLOUDY (*)    Specific Gravity, Urine >1.030 (*)    Hgb urine dipstick MODERATE (*)    Bilirubin Urine SMALL (*)    Ketones, ur 15 (*)    Protein, ur 30 (*)    All other components  within normal limits  URINE MICROSCOPIC-ADD ON - Abnormal; Notable for the following:    Squamous Epithelial / LPF FEW (*)    Bacteria, UA FEW (*)    All other components within normal limits  PREGNANCY, URINE   Imaging Review No results found.  EKG Interpretation   None       MDM  No diagnosis found. Nursing notes including past medical history and social history reviewed and considered in documentation Labs/vital reviewed and considered     Sharyon Cable, MD 10/27/13 [phone]

## 2013-10-28 ENCOUNTER — Encounter (HOSPITAL_COMMUNITY): Payer: Self-pay | Admitting: Emergency Medicine

## 2013-10-28 ENCOUNTER — Emergency Department (HOSPITAL_COMMUNITY)
Admission: EM | Admit: 2013-10-28 | Discharge: 2013-10-29 | Disposition: A | Discharge status: Home / Self Care | Payer: Medicaid Other | Attending: Emergency Medicine | Admitting: Emergency Medicine

## 2013-10-28 ENCOUNTER — Emergency Department (HOSPITAL_COMMUNITY): Payer: Medicaid Other

## 2013-10-28 DIAGNOSIS — F17200 Nicotine dependence, unspecified, uncomplicated: Secondary | ICD-10-CM | POA: Insufficient documentation

## 2013-10-28 DIAGNOSIS — R112 Nausea with vomiting, unspecified: Secondary | ICD-10-CM | POA: Insufficient documentation

## 2013-10-28 DIAGNOSIS — R109 Unspecified abdominal pain: Secondary | ICD-10-CM

## 2013-10-28 DIAGNOSIS — R6883 Chills (without fever): Secondary | ICD-10-CM | POA: Insufficient documentation

## 2013-10-28 DIAGNOSIS — J45909 Unspecified asthma, uncomplicated: Secondary | ICD-10-CM | POA: Insufficient documentation

## 2013-10-28 DIAGNOSIS — Z3202 Encounter for pregnancy test, result negative: Secondary | ICD-10-CM | POA: Insufficient documentation

## 2013-10-28 DIAGNOSIS — Z8679 Personal history of other diseases of the circulatory system: Secondary | ICD-10-CM | POA: Insufficient documentation

## 2013-10-28 DIAGNOSIS — G8929 Other chronic pain: Secondary | ICD-10-CM | POA: Insufficient documentation

## 2013-10-28 DIAGNOSIS — R1084 Generalized abdominal pain: Secondary | ICD-10-CM | POA: Insufficient documentation

## 2013-10-28 LAB — URINE MICROSCOPIC-ADD ON

## 2013-10-28 LAB — URINALYSIS, ROUTINE W REFLEX MICROSCOPIC
Bilirubin Urine: NEGATIVE
Glucose, UA: NEGATIVE mg/dL
Leukocytes, UA: NEGATIVE
NITRITE: NEGATIVE
Protein, ur: NEGATIVE mg/dL
SPECIFIC GRAVITY, URINE: 1.02 (ref 1.005–1.030)
Urobilinogen, UA: 0.2 mg/dL (ref 0.0–1.0)
pH: 5.5 (ref 5.0–8.0)

## 2013-10-28 LAB — COMPREHENSIVE METABOLIC PANEL
ALBUMIN: 3.8 g/dL (ref 3.5–5.2)
ALK PHOS: 61 U/L (ref 39–117)
ALT: 13 U/L (ref 0–35)
AST: 16 U/L (ref 0–37)
BILIRUBIN TOTAL: 0.4 mg/dL (ref 0.3–1.2)
BUN: 12 mg/dL (ref 6–23)
CHLORIDE: 101 meq/L (ref 96–112)
CO2: 23 mEq/L (ref 19–32)
Calcium: 8.8 mg/dL (ref 8.4–10.5)
Creatinine, Ser: 0.65 mg/dL (ref 0.50–1.10)
GFR calc Af Amer: >90 mL/min (ref >90)
GFR calc non Af Amer: >90 mL/min (ref >90)
Glucose, Bld: 75 mg/dL (ref 70–99)
POTASSIUM: 3.5 meq/L — AB (ref 3.7–5.3)
SODIUM: 138 meq/L (ref 137–147)
TOTAL PROTEIN: 7.7 g/dL (ref 6.0–8.3)

## 2013-10-28 LAB — CBC WITH DIFFERENTIAL/PLATELET
BASOS PCT: 1 % (ref 0–1)
Basophils Absolute: 0.1 10*3/uL (ref 0.0–0.1)
EOS ABS: 0.1 10*3/uL (ref 0.0–0.7)
Eosinophils Relative: 1 % (ref 0–5)
HCT: 40.8 % (ref 36.0–46.0)
Hemoglobin: 14 g/dL (ref 12.0–15.0)
Lymphocytes Relative: 26 % (ref 12–46)
Lymphs Abs: 3.3 10*3/uL (ref 0.7–4.0)
MCH: 32.9 pg (ref 26.0–34.0)
MCHC: 34.3 g/dL (ref 30.0–36.0)
MCV: 95.8 fL (ref 78.0–100.0)
Monocytes Absolute: 0.7 10*3/uL (ref 0.1–1.0)
Monocytes Relative: 6 % (ref 3–12)
NEUTROS ABS: 8.6 10*3/uL — AB (ref 1.7–7.7)
Neutrophils Relative %: 66 % (ref 43–77)
PLATELETS: 314 10*3/uL (ref 150–400)
RBC: 4.26 MIL/uL (ref 3.87–5.11)
RDW: 13.9 % (ref 11.5–15.5)
WBC: 12.8 10*3/uL — ABNORMAL HIGH (ref 4.0–10.5)

## 2013-10-28 LAB — PREGNANCY, URINE: PREG TEST UR: NEGATIVE

## 2013-10-28 MED ORDER — ONDANSETRON 4 MG PO TBDP: ORAL_TABLET | ORAL | Status: DC

## 2013-10-28 MED ORDER — ONDANSETRON HCL 4 MG/2ML IJ SOLN
4.0000 mg | Freq: Once | INTRAMUSCULAR | Status: AC
Start: 2013-10-28 — End: 2013-10-28
  Administered 2013-10-28: 4 mg via INTRAVENOUS
  Filled 2013-10-28: qty 2

## 2013-10-28 MED ORDER — SODIUM CHLORIDE 0.9 % IV BOLUS (SEPSIS)
1000.0000 mL | Freq: Once | INTRAVENOUS | Status: AC
  Administered 2013-10-28: 1000 mL via INTRAVENOUS

## 2013-10-28 MED ORDER — ONDANSETRON HCL 4 MG/2ML IJ SOLN
4.0000 mg | Freq: Once | INTRAMUSCULAR | Status: AC
  Administered 2013-10-28: 4 mg via INTRAVENOUS
  Filled 2013-10-28: qty 2

## 2013-10-28 MED ORDER — HYDROCODONE-ACETAMINOPHEN 5-325 MG PO TABS: 1.0000 | ORAL_TABLET | Freq: Four times a day (QID) | ORAL | Status: DC | PRN

## 2013-10-28 MED ORDER — HYDROMORPHONE HCL PF 1 MG/ML IJ SOLN
1.0000 mg | Freq: Once | INTRAMUSCULAR | Status: AC
  Administered 2013-10-28: 1 mg via INTRAVENOUS
  Filled 2013-10-28: qty 1

## 2013-10-28 MED ORDER — HYDROMORPHONE HCL PF 1 MG/ML IJ SOLN
1.0000 mg | Freq: Once | INTRAMUSCULAR | Status: DC
Start: 2013-10-29 — End: 2013-10-28

## 2013-10-28 NOTE — ED Provider Notes (Signed)
CSN: [id number]     Arrival date & time 10/28/13  1811 History   This chart was scribed for Maudry Diego, MD by Adriana Reams, ED Scribe. This patient was seen in room APA04/APA04 and the patient's care was started at 2039.   First MD Initiated Contact with Patient 10/28/13 2039     Chief Complaint  Patient presents with  . Abdominal Pain  . Emesis    Patient is a 38 y.o. female presenting with abdominal pain. The history is provided by the patient. No language interpreter was used.  Abdominal Pain Pain location:  Generalized Pain radiates to:  Does not radiate Pain severity:  Severe Onset quality:  Gradual Duration:  2 days Progression:  Worsening Chronicity:  New Relieved by:  Nothing Worsened by:  Nothing tried Associated symptoms: nausea and vomiting   Associated symptoms: no chest pain, no cough, no diarrhea, no fatigue, no fever and no hematuria    HPI Comments: Bonnie Jensen is a 38 y.o. female who presents to the Emergency Department complaining of 2 days of gradual onset, gradually worsening generalized abdominal pain with associated vomiting and chills. She was seen in the ED last night for the same complaint. She denies diarrhea, fever or any other symptoms. She has been exposed to sick individuals.    Past Medical History  Diagnosis Date  . Asthma   . Migraine   . Chronic abdominal pain   . Chronic back pain    Past Surgical History  Procedure Laterality Date  . Appendectomy    . Cholecystectomy    . Tonsillectomy    . Tubal ligation    . Brain surgery     History reviewed. No pertinent family history. History  Substance Use Topics  . Smoking status: Current Every Day Smoker -- 1.00 packs/day for 15 years    Types: Cigarettes  . Smokeless tobacco: Never Used  . Alcohol Use: No   OB History   Grav Para Term Preterm Abortions TAB SAB Ect Mult Living                 Review of Systems  Constitutional: Negative for fever, appetite change and  fatigue.  HENT: Negative for congestion, ear discharge and sinus pressure.   Eyes: Negative for discharge.  Respiratory: Negative for cough.   Cardiovascular: Negative for chest pain.  Gastrointestinal: Positive for nausea, vomiting and abdominal pain. Negative for diarrhea.  Genitourinary: Negative for frequency and hematuria.  Musculoskeletal: Negative for back pain.  Skin: Negative for rash.  Neurological: Negative for seizures.  Psychiatric/Behavioral: Negative for hallucinations.    Allergies  Bee venom; Ketorolac tromethamine; Imitrex; Isometheptene-apap-dichloral; and Tylenol with codeine #3  Home Medications   Current Outpatient Rx  Name  Route  Sig  Dispense  Refill  . acetaminophen (TYLENOL) 500 MG tablet   Oral   Take 500 mg by mouth every 6 (six) hours as needed.         . ondansetron (ZOFRAN) 8 MG tablet   Oral   Take 1 tablet (8 mg total) by mouth every 8 (eight) hours as needed for nausea.   8 tablet   0    Triage Vitals; BP 121/85  Pulse 122  Temp(Src) 98.2 F (36.8 C) (Oral)  Resp 20  Ht 4\' 11"  (1.499 m)  Wt 172 lb 12.8 oz (78.382 kg)  BMI 34.88 kg/m2  SpO2 98%  LMP 10/20/2013   Physical Exam  Constitutional: She is oriented to person, place,  and time. She appears well-developed.  HENT:  Head: Normocephalic.  Eyes: Conjunctivae and EOM are normal. No scleral icterus.  Neck: Neck supple. No thyromegaly present.  Cardiovascular: Normal rate and regular rhythm.  Exam reveals no gallop and no friction rub.   No murmur heard. Pulmonary/Chest: No stridor. She has no wheezes. She has no rales. She exhibits no tenderness.  Abdominal: She exhibits no distension. There is tenderness. There is no rebound.  Mild tenderness throughout abdomen   Musculoskeletal: Normal range of motion. She exhibits no edema.  Lymphadenopathy:    She has no cervical adenopathy.  Neurological: She is oriented to person, place, and time. She exhibits normal muscle tone.  Coordination normal.  Skin: No rash noted. No erythema.  Psychiatric: She has a normal mood and affect. Her behavior is normal.    ED Course  Procedures (including critical care time) DIAGNOSTIC STUDIES: Oxygen Saturation is 98% on RA, normal by my interpretation.    COORDINATION OF CARE: 8:49 PM Discussed treatment plan which includes .9% NaCl 1000 mL bolus, 4 mg Zofran injection, 1 mg Dilaudid injection, CBC with differential, comprehensive metabolic panel, urinalysis, and pregnancy test with pt at bedside and pt agreed to plan.    Labs Review Labs Reviewed - No data to display Imaging Review No results found.  EKG Interpretation   None       MDM  Gastroenteritis The chart was scribed for me under my direct supervision.  I personally performed the history, physical, and medical decision making and all procedures in the evaluation of this patient.Maudry Diego, MD 10/29/13 [phone]

## 2013-10-28 NOTE — ED Notes (Signed)
Pt c/o abd pain and vomiting since yesterday and was seen here for the same last night.

## 2013-10-28 NOTE — ED Notes (Signed)
Patient aware of need for urine specimen  - will call when she feels she can void

## 2013-10-29 MED ORDER — HYDROCODONE-ACETAMINOPHEN 5-325 MG PO TABS: 1.0000 | ORAL_TABLET | Freq: Four times a day (QID) | ORAL | Status: DC | PRN

## 2013-10-29 MED ORDER — ONDANSETRON 4 MG PO TBDP: ORAL_TABLET | ORAL | Status: DC

## 2013-10-29 NOTE — Discharge Instructions (Signed)
Drink plenty of fluids and follow up in two days if not improving.

## 2013-11-11 ENCOUNTER — Encounter (HOSPITAL_COMMUNITY): Payer: Self-pay | Admitting: Emergency Medicine

## 2013-11-11 ENCOUNTER — Emergency Department (HOSPITAL_COMMUNITY)
Admission: EM | Admit: 2013-11-11 | Discharge: 2013-11-11 | Disposition: A | Discharge status: Home / Self Care | Payer: Medicaid Other | Attending: Emergency Medicine | Admitting: Emergency Medicine

## 2013-11-11 DIAGNOSIS — R1084 Generalized abdominal pain: Secondary | ICD-10-CM | POA: Insufficient documentation

## 2013-11-11 DIAGNOSIS — J45909 Unspecified asthma, uncomplicated: Secondary | ICD-10-CM | POA: Insufficient documentation

## 2013-11-11 DIAGNOSIS — G8929 Other chronic pain: Secondary | ICD-10-CM | POA: Insufficient documentation

## 2013-11-11 DIAGNOSIS — R109 Unspecified abdominal pain: Secondary | ICD-10-CM

## 2013-11-11 DIAGNOSIS — F17200 Nicotine dependence, unspecified, uncomplicated: Secondary | ICD-10-CM | POA: Insufficient documentation

## 2013-11-11 DIAGNOSIS — Z9851 Tubal ligation status: Secondary | ICD-10-CM | POA: Insufficient documentation

## 2013-11-11 DIAGNOSIS — M549 Dorsalgia, unspecified: Secondary | ICD-10-CM | POA: Insufficient documentation

## 2013-11-11 DIAGNOSIS — Z8679 Personal history of other diseases of the circulatory system: Secondary | ICD-10-CM | POA: Insufficient documentation

## 2013-11-11 DIAGNOSIS — Z3202 Encounter for pregnancy test, result negative: Secondary | ICD-10-CM | POA: Insufficient documentation

## 2013-11-11 DIAGNOSIS — Z9089 Acquired absence of other organs: Secondary | ICD-10-CM | POA: Insufficient documentation

## 2013-11-11 DIAGNOSIS — R112 Nausea with vomiting, unspecified: Secondary | ICD-10-CM | POA: Insufficient documentation

## 2013-11-11 LAB — COMPREHENSIVE METABOLIC PANEL
ALBUMIN: 3.7 g/dL (ref 3.5–5.2)
ALK PHOS: 92 U/L (ref 39–117)
ALT: 29 U/L (ref 0–35)
AST: 14 U/L (ref 0–37)
BILIRUBIN TOTAL: 0.3 mg/dL (ref 0.3–1.2)
BUN: 5 mg/dL — AB (ref 6–23)
CHLORIDE: 103 meq/L (ref 96–112)
CO2: 25 mEq/L (ref 19–32)
Calcium: 9.4 mg/dL (ref 8.4–10.5)
Creatinine, Ser: 0.61 mg/dL (ref 0.50–1.10)
GFR calc Af Amer: >90 mL/min (ref >90)
GFR calc non Af Amer: >90 mL/min (ref >90)
Glucose, Bld: 99 mg/dL (ref 70–99)
POTASSIUM: 3.9 meq/L (ref 3.7–5.3)
Sodium: 139 mEq/L (ref 137–147)
Total Protein: 7.6 g/dL (ref 6.0–8.3)

## 2013-11-11 LAB — CBC WITH DIFFERENTIAL/PLATELET
BASOS ABS: 0 10*3/uL (ref 0.0–0.1)
Basophils Relative: 1 % (ref 0–1)
Eosinophils Absolute: 0.1 10*3/uL (ref 0.0–0.7)
Eosinophils Relative: 2 % (ref 0–5)
HEMATOCRIT: 41 % (ref 36.0–46.0)
HEMOGLOBIN: 13.7 g/dL (ref 12.0–15.0)
LYMPHS PCT: 21 % (ref 12–46)
Lymphs Abs: 1.8 10*3/uL (ref 0.7–4.0)
MCH: 32.5 pg (ref 26.0–34.0)
MCHC: 33.4 g/dL (ref 30.0–36.0)
MCV: 97.2 fL (ref 78.0–100.0)
MONO ABS: 0.5 10*3/uL (ref 0.1–1.0)
MONOS PCT: 6 % (ref 3–12)
Neutro Abs: 5.9 10*3/uL (ref 1.7–7.7)
Neutrophils Relative %: 71 % (ref 43–77)
Platelets: 294 10*3/uL (ref 150–400)
RBC: 4.22 MIL/uL (ref 3.87–5.11)
RDW: 13.8 % (ref 11.5–15.5)
WBC: 8.4 10*3/uL (ref 4.0–10.5)

## 2013-11-11 LAB — URINALYSIS, ROUTINE W REFLEX MICROSCOPIC
BILIRUBIN URINE: NEGATIVE
GLUCOSE, UA: NEGATIVE mg/dL
HGB URINE DIPSTICK: NEGATIVE
Ketones, ur: NEGATIVE mg/dL
Leukocytes, UA: NEGATIVE
NITRITE: NEGATIVE
PH: 6 (ref 5.0–8.0)
Protein, ur: NEGATIVE mg/dL
Urobilinogen, UA: 0.2 mg/dL (ref 0.0–1.0)

## 2013-11-11 LAB — POCT PREGNANCY, URINE: Preg Test, Ur: NEGATIVE

## 2013-11-11 LAB — LIPASE, BLOOD: Lipase: 21 U/L (ref 11–59)

## 2013-11-11 MED ORDER — ONDANSETRON 8 MG PO TBDP
8.0000 mg | ORAL_TABLET | Freq: Once | ORAL | Status: AC
  Administered 2013-11-11: 8 mg via ORAL
  Filled 2013-11-11: qty 1

## 2013-11-11 MED ORDER — HYDROCODONE-ACETAMINOPHEN 5-325 MG PO TABS: ORAL_TABLET | ORAL | Status: DC

## 2013-11-11 MED ORDER — METOCLOPRAMIDE HCL 10 MG PO TABS
10.0000 mg | ORAL_TABLET | Freq: Once | ORAL | Status: AC
  Administered 2013-11-11: 10 mg via ORAL
  Filled 2013-11-11: qty 1

## 2013-11-11 MED ORDER — HYDROMORPHONE HCL PF 1 MG/ML IJ SOLN
1.0000 mg | Freq: Once | INTRAMUSCULAR | Status: AC
Start: 2013-11-11 — End: 2013-11-11
  Administered 2013-11-11: 1 mg via INTRAMUSCULAR
  Filled 2013-11-11: qty 1

## 2013-11-11 MED ORDER — OXYCODONE-ACETAMINOPHEN 5-325 MG PO TABS
1.0000 | ORAL_TABLET | Freq: Once | ORAL | Status: AC
  Administered 2013-11-11: 1 via ORAL
  Filled 2013-11-11: qty 1

## 2013-11-11 NOTE — ED Notes (Signed)
Patient c/o lower abd pain that radiates into flanks and blower back bilaterally. Patient reports nausea and vomiting but denies any diarrhea or urinary symptoms. Patient unsure of any fevers.

## 2013-11-11 NOTE — Discharge Instructions (Signed)

## 2013-11-11 NOTE — ED Provider Notes (Signed)
CSN: [id number]     Arrival date & time 11/11/13  1093 History   This chart was scribed for Bonnie Parkinson, PA-C by Lovena Le Day, ED scribe. This patient was seen in room APA14/APA14 and the patient's care was started at [facility].  Chief Complaint  Patient presents with  . Abdominal Pain   HPI HPI Comments: Bonnie Jensen is a 38 y.o. female with hx of chronic abdominal pain, presents to the Emergency Department complaining of intermittent lower abdominal pain that radiates into her bilateral flanks. She reports pain is similar to previous episode when she was seen here about 2 weeks ago for same, she states her symptoms eased off after she was seen here and returned about 3 days ago. She reports associated emesis episodes and not tolerating food or water. She denies diarrhea, fever, dysuria, difficult BMs, chest pain, pelvic pain or dyspnea She had an abdominal X-ray last time she was seen here. She had an upper endoscopy more than five years ago. She states that she has not followed up with anyone or tried to arrange f/u since her previous visit.  Hx of cholecystectomy, BTL and appendectomy    Past Medical History  Diagnosis Date  . Asthma   . Migraine   . Chronic abdominal pain   . Chronic back pain    Past Surgical History  Procedure Laterality Date  . Appendectomy    . Cholecystectomy    . Tonsillectomy    . Tubal ligation    . Brain surgery     History reviewed. No pertinent family history. History  Substance Use Topics  . Smoking status: Current Every Day Smoker -- 1.00 packs/day for 15 years    Types: Cigarettes  . Smokeless tobacco: Never Used  . Alcohol Use: No   OB History   Grav Para Term Preterm Abortions TAB SAB Ect Mult Living   1 1 1       1      Review of Systems  Constitutional: Negative for fever, chills and fatigue.  HENT: Negative for sore throat and trouble swallowing.   Respiratory: Negative for cough, shortness of breath and wheezing.   Cardiovascular:  Negative for chest pain and palpitations.  Gastrointestinal: Positive for nausea, vomiting and abdominal pain. Negative for diarrhea, blood in stool and abdominal distention.  Genitourinary: Negative for dysuria, hematuria, flank pain, vaginal bleeding, vaginal discharge, difficulty urinating, vaginal pain and pelvic pain.  Musculoskeletal: Negative for arthralgias, back pain, myalgias, neck pain and neck stiffness.  Skin: Negative for rash.  Neurological: Negative for dizziness, weakness and numbness.  Hematological: Does not bruise/bleed easily.   Allergies  Bee venom; Ketorolac tromethamine; Imitrex; Isometheptene-apap-dichloral; and Tylenol with codeine #3  Home Medications   Current Outpatient Rx  Name  Route  Sig  Dispense  Refill  . acetaminophen (TYLENOL) 500 MG tablet   Oral   Take 500 mg by mouth every 6 (six) hours as needed for mild pain.          Marland Kitchen ibuprofen (ADVIL,MOTRIN) 200 MG tablet   Oral   Take 800 mg by mouth every 6 (six) hours as needed for moderate pain.          Triage Vitals: BP 133/89  Pulse 92  Temp(Src) 98 F (36.7 C) (Oral)  Resp 18  Ht 4\' 11"  (1.499 m)  Wt 172 lb 12.8 oz (78.382 kg)  BMI 34.88 kg/m2  SpO2 98%  LMP 11/04/2013  Physical Exam  Nursing note and vitals reviewed.  Constitutional: She is oriented to person, place, and time. She appears well-developed and well-nourished. No distress.  HENT:  Head: Normocephalic and atraumatic.  Mouth/Throat: Oropharynx is clear and moist.  Eyes: Conjunctivae are normal. Right eye exhibits no discharge. Left eye exhibits no discharge.  Neck: Normal range of motion.  Cardiovascular: Normal rate, regular rhythm, normal heart sounds and intact distal pulses.   No murmur heard. Pulmonary/Chest: Effort normal and breath sounds normal. No respiratory distress. She has no wheezes. She has no rales.  Abdominal: Soft. Bowel sounds are normal. She exhibits no distension and no mass. There is tenderness  (diffuse abdominal tenderness). There is no rebound and no guarding.  Musculoskeletal: Normal range of motion. She exhibits no edema.  Lymphadenopathy:    She has no cervical adenopathy.  Neurological: She is alert and oriented to person, place, and time. She exhibits normal muscle tone. Coordination normal.  Skin: Skin is warm and dry.  Psychiatric: She has a normal mood and affect. Thought content normal.   ED Course  Procedures (including critical care time) DIAGNOSTIC STUDIES: Oxygen Saturation is 98% on room air, normal by my interpretation.    COORDINATION OF CARE: At 1040 AM Discussed treatment plan with patient which includes blood work, pain/nausea medicine. Patient agrees.   Labs Review Labs Reviewed  URINALYSIS, ROUTINE W REFLEX MICROSCOPIC - Abnormal; Notable for the following:    Specific Gravity, Urine <1.005 (*)    All other components within normal limits  COMPREHENSIVE METABOLIC PANEL - Abnormal; Notable for the following:    BUN 5 (*)    All other components within normal limits  CBC WITH DIFFERENTIAL  LIPASE, BLOOD  POCT PREGNANCY, URINE   Imaging Review No results found.  EKG Interpretation   None      MDM  1045 AM: She had negative abdominal series when she was seen here on 10/28/13.  Patient tolerating po fluids.  No vomiting or diarrhea during ED stay.  Reports is she feeling better since ED treatment.  Pain is similar to previous, no concerning sx's for acute abdomen.  I have advised patient that she will need to arrange f/u with a PMD or GI given that she has hx of chronic abdominal pain.  Pt agrees to plan and verbalized understanding  Final diagnoses:  Abdominal pain   I personally performed the services described in this documentation, which was scribed in my presence. The recorded information has been reviewed and is accurate.        Shagun Wordell L. [place], PA-C 11/12/13 1628

## 2013-11-13 NOTE — ED Provider Notes (Signed)
Medical screening examination/treatment/procedure(s) were performed by non-physician practitioner and as supervising physician I was immediately available for consultation/collaboration.  EKG Interpretation   None         Alfonzo Feller, DO 11/13/13 [phone]

## 2014-01-13 ENCOUNTER — Encounter (HOSPITAL_COMMUNITY): Payer: Self-pay | Admitting: Emergency Medicine

## 2014-01-13 ENCOUNTER — Inpatient Hospital Stay (HOSPITAL_COMMUNITY)
Admission: EM | Admit: 2014-01-13 | Discharge: 2014-01-17 | DRG: 918 | Disposition: A | Discharge status: Home / Self Care | Payer: Medicaid Other | Attending: Family Medicine | Admitting: Family Medicine

## 2014-01-13 ENCOUNTER — Emergency Department (HOSPITAL_COMMUNITY): Payer: Medicaid Other

## 2014-01-13 DIAGNOSIS — M549 Dorsalgia, unspecified: Secondary | ICD-10-CM | POA: Diagnosis present

## 2014-01-13 DIAGNOSIS — R740 Nonspecific elevation of levels of transaminase and lactic acid dehydrogenase [LDH]: Secondary | ICD-10-CM

## 2014-01-13 DIAGNOSIS — Z9089 Acquired absence of other organs: Secondary | ICD-10-CM

## 2014-01-13 DIAGNOSIS — F17200 Nicotine dependence, unspecified, uncomplicated: Secondary | ICD-10-CM | POA: Diagnosis present

## 2014-01-13 DIAGNOSIS — R7402 Elevation of levels of lactic acid dehydrogenase (LDH): Secondary | ICD-10-CM | POA: Diagnosis present

## 2014-01-13 DIAGNOSIS — Z720 Tobacco use: Secondary | ICD-10-CM

## 2014-01-13 DIAGNOSIS — K589 Irritable bowel syndrome without diarrhea: Secondary | ICD-10-CM | POA: Diagnosis present

## 2014-01-13 DIAGNOSIS — E119 Type 2 diabetes mellitus without complications: Secondary | ICD-10-CM | POA: Diagnosis present

## 2014-01-13 DIAGNOSIS — G8929 Other chronic pain: Secondary | ICD-10-CM | POA: Diagnosis present

## 2014-01-13 DIAGNOSIS — R7989 Other specified abnormal findings of blood chemistry: Secondary | ICD-10-CM | POA: Diagnosis present

## 2014-01-13 DIAGNOSIS — J45909 Unspecified asthma, uncomplicated: Secondary | ICD-10-CM | POA: Diagnosis present

## 2014-01-13 DIAGNOSIS — R7401 Elevation of levels of liver transaminase levels: Secondary | ICD-10-CM | POA: Diagnosis present

## 2014-01-13 DIAGNOSIS — E872 Acidosis, unspecified: Secondary | ICD-10-CM | POA: Diagnosis present

## 2014-01-13 DIAGNOSIS — T391X1A Poisoning by 4-Aminophenol derivatives, accidental (unintentional), initial encounter: Principal | ICD-10-CM | POA: Diagnosis present

## 2014-01-13 DIAGNOSIS — R109 Unspecified abdominal pain: Secondary | ICD-10-CM | POA: Diagnosis present

## 2014-01-13 DIAGNOSIS — B370 Candidal stomatitis: Secondary | ICD-10-CM | POA: Diagnosis present

## 2014-01-13 LAB — CBC WITH DIFFERENTIAL/PLATELET
Basophils Absolute: 0.1 10*3/uL (ref 0.0–0.1)
Basophils Relative: 0 % (ref 0–1)
Eosinophils Absolute: 0.1 10*3/uL (ref 0.0–0.7)
Eosinophils Relative: 1 % (ref 0–5)
HCT: 42.7 % (ref 36.0–46.0)
HEMOGLOBIN: 14.5 g/dL (ref 12.0–15.0)
LYMPHS ABS: 2.3 10*3/uL (ref 0.7–4.0)
Lymphocytes Relative: 16 % (ref 12–46)
MCH: 32.6 pg (ref 26.0–34.0)
MCHC: 34 g/dL (ref 30.0–36.0)
MCV: 96 fL (ref 78.0–100.0)
MONOS PCT: 6 % (ref 3–12)
Monocytes Absolute: 0.9 10*3/uL (ref 0.1–1.0)
NEUTROS ABS: 11.4 10*3/uL — AB (ref 1.7–7.7)
Neutrophils Relative %: 77 % (ref 43–77)
PLATELETS: 312 10*3/uL (ref 150–400)
RBC: 4.45 MIL/uL (ref 3.87–5.11)
RDW: 13.2 % (ref 11.5–15.5)
WBC: 14.7 10*3/uL — ABNORMAL HIGH (ref 4.0–10.5)

## 2014-01-13 MED ORDER — SODIUM CHLORIDE 0.9 % IV BOLUS (SEPSIS)
1000.0000 mL | INTRAVENOUS | Status: AC
  Administered 2014-01-13: 1000 mL via INTRAVENOUS

## 2014-01-13 MED ORDER — MORPHINE SULFATE 4 MG/ML IJ SOLN
4.0000 mg | Freq: Once | INTRAMUSCULAR | Status: AC
  Administered 2014-01-13: 4 mg via INTRAVENOUS
  Filled 2014-01-13: qty 1

## 2014-01-13 MED ORDER — ONDANSETRON HCL 4 MG/2ML IJ SOLN
4.0000 mg | Freq: Once | INTRAMUSCULAR | Status: AC
  Administered 2014-01-13: 4 mg via INTRAVENOUS
  Filled 2014-01-13: qty 2

## 2014-01-13 NOTE — ED Provider Notes (Signed)
CSN: [id number]     Arrival date & time 01/13/14  2225 History  This chart was scribed for Johnna Acosta, MD by Jenne Campus, ED Scribe. This patient was seen in room APA18/APA18 and the patient's care was started at 11:33 PM.   Chief Complaint  Patient presents with  . Abdominal Pain    The history is provided by the patient. No language interpreter was used.    HPI Comments: Bonnie Jensen is a 38 y.o. female who presents to the Emergency Department complaining of 2 weeks of constant, gradually worsening RLQ pain that radiates around to the right lower back that feels like "labor". She denies any associated vaginal pain. She reports associated nausea and emesis.  She reports that she is having normal BMs. She denies any dysuria, vaginal discharge, constipation or hematochezia as associated symptoms. She has a h/o cholecystectomy and appendectomy with a BTL 16 years ago. She also denies chance of pregnancy stating that she had a negative pregnancy test at home. She denies any prior STD infections and denies any recent sexual encounters. She denies any changes in her menses.   Past Medical History  Diagnosis Date  . Asthma   . Migraine   . Chronic abdominal pain   . Chronic back pain    Past Surgical History  Procedure Laterality Date  . Appendectomy    . Cholecystectomy    . Tonsillectomy    . Tubal ligation    . Brain surgery     No family history on file. History  Substance Use Topics  . Smoking status: Current Every Day Smoker -- 1.00 packs/day for 15 years    Types: Cigarettes  . Smokeless tobacco: Never Used  . Alcohol Use: No   OB History   Grav Para Term Preterm Abortions TAB SAB Ect Mult Living   1 1 1       1      Review of Systems  A complete 10 system review of systems was obtained and all systems are negative except as noted in the HPI and PMH.    Allergies  Bee venom; Ketorolac tromethamine; Imitrex; Isometheptene-apap-dichloral; and Tylenol with  codeine #3  Home Medications   Current Outpatient Rx  Name  Route  Sig  Dispense  Refill  . ibuprofen (ADVIL,MOTRIN) 200 MG tablet   Oral   Take 800 mg by mouth every 6 (six) hours as needed for moderate pain.          Triage Vitals: BP 133/89  Pulse 120  Temp(Src) 97.8 F (36.6 C) (Oral)  Resp 20  Ht 4\' 11"  (1.499 m)  Wt 170 lb (77.111 kg)  BMI 34.32 kg/m2  SpO2 96%  LMP 12/30/2013  Physical Exam  Nursing note and vitals reviewed. Constitutional: She is oriented to person, place, and time. She appears well-developed and well-nourished. No distress.  HENT:  Head: Normocephalic and atraumatic.  White to yellow coating on tongue consistent with thrush  Eyes: EOM are normal.  Neck: Normal range of motion.  Cardiovascular: Normal rate, regular rhythm and normal heart sounds.   Tachycardia noted in triage has resolved   Pulmonary/Chest: Effort normal and breath sounds normal.  Abdominal: Soft. She exhibits no distension. There is tenderness. There is guarding.  Bilateral lower abdominal and suprapubic tenderness with guarding  Musculoskeletal: Normal range of motion.  Neurological: She is alert and oriented to person, place, and time.  Skin: Skin is warm and dry.  Psychiatric: She has a normal  mood and affect. Judgment normal.    ED Course  Procedures (including critical care time)  Medications  morphine 4 MG/ML injection 4 mg (4 mg Intravenous Given 01/13/14 2356)  ondansetron (ZOFRAN) injection 4 mg (4 mg Intravenous Given 01/13/14 2356)  sodium chloride 0.9 % bolus 1,000 mL (1,000 mLs Intravenous New Bag/Given 01/13/14 2355)  iohexol (OMNIPAQUE) 300 MG/ML solution 50 mL (50 mLs Oral Contrast Given 01/14/14 0002)  iohexol (OMNIPAQUE) 300 MG/ML solution 100 mL (100 mLs Intravenous Contrast Given 01/14/14 0035)  morphine 4 MG/ML injection 4 mg (4 mg Intravenous Given 01/14/14 0110)    DIAGNOSTIC STUDIES: Oxygen Saturation is 96% on RA, adequate by my interpretation.     COORDINATION OF CARE: 11:37 PM-Discussed treatment plan which includes medications, CT of abdomen, CBC panel, CMP and UA with pt at bedside and pt agreed to plan.   Labs Review Labs Reviewed  CBC WITH DIFFERENTIAL - Abnormal; Notable for the following:    WBC 14.7 (*)    Neutro Abs 11.4 (*)    All other components within normal limits  COMPREHENSIVE METABOLIC PANEL - Abnormal; Notable for the following:    Potassium 3.6 (*)    CO2 18 (*)    Glucose, Bld 225 (*)    AST 290 (*)    ALT 270 (*)    All other components within normal limits  URINALYSIS, ROUTINE W REFLEX MICROSCOPIC - Abnormal; Notable for the following:    Color, Urine STRAW (*)    Specific Gravity, Urine <1.005 (*)    Glucose, UA 500 (*)    All other components within normal limits  LIPASE, BLOOD  PREGNANCY, URINE  ACETAMINOPHEN LEVEL   Imaging Review Ct Abdomen Pelvis W Contrast  01/14/2014   CLINICAL DATA:  Low abdominal pain, cramping  EXAM: CT ABDOMEN AND PELVIS WITH CONTRAST  TECHNIQUE: Multidetector CT imaging of the abdomen and pelvis was performed using the standard protocol following bolus administration of intravenous contrast.  CONTRAST:  16mL OMNIPAQUE IOHEXOL 300 MG/ML SOLN, 174mL OMNIPAQUE IOHEXOL 300 MG/ML SOLN  COMPARISON:  None.  FINDINGS: There is a 4.6 mm stable left lower lobe pulmonary nodule unchanged compared with 06/20/2011.  The liver demonstrates no focal abnormality. There is no intrahepatic or extrahepatic biliary ductal dilatation. The gallbladder is surgically absent. The spleen demonstrates no focal abnormality. The kidneys, adrenal glands and pancreas are normal. The bladder is unremarkable.  The stomach, duodenum, small intestine, and large intestine demonstrate no contrast extravasation or dilatation. There is no pneumoperitoneum, pneumatosis, or portal venous gas. There is no abdominal or pelvic free fluid. There is no lymphadenopathy. The ovaries and uterus are unremarkable.  The  abdominal aorta is normal in caliber .  There are no lytic or sclerotic osseous lesions.  IMPRESSION: No acute abdominal or pelvic pathology.   Electronically Signed   By: Kathreen Devoid   On: 01/14/2014 00:57      MDM   Final diagnoses:  Abdominal pain  Transaminitis   Pain is primarily lower abd but has upper Right abd ttp as well.  Hx of chole, no hx of ETOH use or pancreatitis.  WBC up > 14,000.  Pain meds and fluids given. Pt admits to using large amounts of tylenol a day - upwards of 8 tablets on days - will add on acetaminophen level to r/o toxicity as she does have a leukocytosis and a transaminitis.  Will treat with acetylcysteine pharmacy consult Admit for obs D/w Dr. Darrick Meigs who agrees.  HD  stable at this time.  Meds given in ED:  Medications  morphine 4 MG/ML injection 4 mg (4 mg Intravenous Given 01/13/14 2356)  ondansetron (ZOFRAN) injection 4 mg (4 mg Intravenous Given 01/13/14 2356)  sodium chloride 0.9 % bolus 1,000 mL (1,000 mLs Intravenous New Bag/Given 01/13/14 2355)  iohexol (OMNIPAQUE) 300 MG/ML solution 50 mL (50 mLs Oral Contrast Given 01/14/14 0002)  iohexol (OMNIPAQUE) 300 MG/ML solution 100 mL (100 mLs Intravenous Contrast Given 01/14/14 0035)  morphine 4 MG/ML injection 4 mg (4 mg Intravenous Given 01/14/14 0110)    New Prescriptions   No medications on file      I personally performed the services described in this documentation, which was scribed in my presence. The recorded information has been reviewed and is accurate.        Johnna Acosta, MD 01/14/14 [phone]

## 2014-01-13 NOTE — ED Notes (Signed)
Having abdominal pain, nausea, vomiting, and back pain. I feel like I am in labor but I am not per pt.

## 2014-01-14 ENCOUNTER — Observation Stay (HOSPITAL_COMMUNITY): Payer: Medicaid Other

## 2014-01-14 ENCOUNTER — Encounter (HOSPITAL_COMMUNITY): Payer: Self-pay | Admitting: *Deleted

## 2014-01-14 DIAGNOSIS — R7401 Elevation of levels of liver transaminase levels: Secondary | ICD-10-CM | POA: Diagnosis present

## 2014-01-14 DIAGNOSIS — R740 Nonspecific elevation of levels of transaminase and lactic acid dehydrogenase [LDH]: Secondary | ICD-10-CM

## 2014-01-14 DIAGNOSIS — F17200 Nicotine dependence, unspecified, uncomplicated: Secondary | ICD-10-CM

## 2014-01-14 DIAGNOSIS — R109 Unspecified abdominal pain: Secondary | ICD-10-CM

## 2014-01-14 LAB — BLOOD GAS, ARTERIAL
Acid-base deficit: 4.8 mmol/L — ABNORMAL HIGH (ref 0.0–2.0)
Bicarbonate: 19.6 mEq/L — ABNORMAL LOW (ref 20.0–24.0)
Drawn by: 21310
O2 Saturation: 94.8 %
PATIENT TEMPERATURE: 37
TCO2: 17.2 mmol/L (ref 0–100)
pCO2 arterial: 35.2 mmHg (ref 35.0–45.0)
pH, Arterial: 7.365 (ref 7.350–7.450)
pO2, Arterial: 89.7 mmHg (ref 80.0–100.0)

## 2014-01-14 LAB — URINALYSIS, ROUTINE W REFLEX MICROSCOPIC
Bilirubin Urine: NEGATIVE
Glucose, UA: 500 mg/dL — AB
HGB URINE DIPSTICK: NEGATIVE
KETONES UR: NEGATIVE mg/dL
Leukocytes, UA: NEGATIVE
Nitrite: NEGATIVE
Protein, ur: NEGATIVE mg/dL
Urobilinogen, UA: 0.2 mg/dL (ref 0.0–1.0)
pH: 5.5 (ref 5.0–8.0)

## 2014-01-14 LAB — COMPREHENSIVE METABOLIC PANEL
ALK PHOS: 71 U/L (ref 39–117)
ALK PHOS: 79 U/L (ref 39–117)
ALT: 270 U/L — AB (ref 0–35)
ALT: 580 U/L — AB (ref 0–35)
AST: 290 U/L — ABNORMAL HIGH (ref 0–37)
AST: 725 U/L — ABNORMAL HIGH (ref 0–37)
Albumin: 3.2 g/dL — ABNORMAL LOW (ref 3.5–5.2)
Albumin: 3.7 g/dL (ref 3.5–5.2)
BILIRUBIN TOTAL: 0.5 mg/dL (ref 0.3–1.2)
BILIRUBIN TOTAL: 0.8 mg/dL (ref 0.3–1.2)
BUN: 7 mg/dL (ref 6–23)
BUN: 9 mg/dL (ref 6–23)
CHLORIDE: 105 meq/L (ref 96–112)
CHLORIDE: 105 meq/L (ref 96–112)
CO2: 18 meq/L — AB (ref 19–32)
CO2: 23 meq/L (ref 19–32)
Calcium: 8.6 mg/dL (ref 8.4–10.5)
Calcium: 9.5 mg/dL (ref 8.4–10.5)
Creatinine, Ser: 0.57 mg/dL (ref 0.50–1.10)
Creatinine, Ser: 0.65 mg/dL (ref 0.50–1.10)
GFR calc Af Amer: >90 mL/min (ref >90)
GFR calc non Af Amer: >90 mL/min (ref >90)
GLUCOSE: 101 mg/dL — AB (ref 70–99)
Glucose, Bld: 225 mg/dL — ABNORMAL HIGH (ref 70–99)
POTASSIUM: 3.6 meq/L — AB (ref 3.7–5.3)
POTASSIUM: 4.2 meq/L (ref 3.7–5.3)
SODIUM: 139 meq/L (ref 137–147)
Sodium: 142 mEq/L (ref 137–147)
TOTAL PROTEIN: 7.6 g/dL (ref 6.0–8.3)
Total Protein: 6.9 g/dL (ref 6.0–8.3)

## 2014-01-14 LAB — COMPREHENSIVE METABOLIC PANEL WITH GFR
ALT: 524 U/L — ABNORMAL HIGH (ref 0–35)
AST: 393 U/L — ABNORMAL HIGH (ref 0–37)
Albumin: 3.3 g/dL — ABNORMAL LOW (ref 3.5–5.2)
Alkaline Phosphatase: 86 U/L (ref 39–117)
BUN: 10 mg/dL (ref 6–23)
CO2: 24 meq/L (ref 19–32)
Calcium: 8.4 mg/dL (ref 8.4–10.5)
Chloride: 102 meq/L (ref 96–112)
Creatinine, Ser: 0.68 mg/dL (ref 0.50–1.10)
GFR calc Af Amer: >90 mL/min
GFR calc non Af Amer: >90 mL/min
Glucose, Bld: 99 mg/dL (ref 70–99)
Potassium: 4.1 meq/L (ref 3.7–5.3)
Sodium: 139 meq/L (ref 137–147)
Total Bilirubin: 0.7 mg/dL (ref 0.3–1.2)
Total Protein: 6.9 g/dL (ref 6.0–8.3)

## 2014-01-14 LAB — CBC
HCT: 39.1 % (ref 36.0–46.0)
Hemoglobin: 13.4 g/dL (ref 12.0–15.0)
MCH: 32.9 pg (ref 26.0–34.0)
MCHC: 34.3 g/dL (ref 30.0–36.0)
MCV: 96.1 fL (ref 78.0–100.0)
Platelets: 270 10*3/uL (ref 150–400)
RBC: 4.07 MIL/uL (ref 3.87–5.11)
RDW: 13.3 % (ref 11.5–15.5)
WBC: 9.3 10*3/uL (ref 4.0–10.5)

## 2014-01-14 LAB — LACTIC ACID, PLASMA: Lactic Acid, Venous: 3.4 mmol/L — ABNORMAL HIGH (ref 0.5–2.2)

## 2014-01-14 LAB — GLUCOSE, CAPILLARY
GLUCOSE-CAPILLARY: 86 mg/dL (ref 70–99)
Glucose-Capillary: 102 mg/dL — ABNORMAL HIGH (ref 70–99)
Glucose-Capillary: 136 mg/dL — ABNORMAL HIGH (ref 70–99)
Glucose-Capillary: 88 mg/dL (ref 70–99)

## 2014-01-14 LAB — SALICYLATE LEVEL: Salicylate Lvl: <2 mg/dL — ABNORMAL LOW (ref 2.8–20.0)

## 2014-01-14 LAB — PROTIME-INR
INR: 1.4 (ref 0.00–1.49)
INR: 1.31 (ref 0.00–1.49)
Prothrombin Time: 16.8 s — ABNORMAL HIGH (ref 11.6–15.2)
Prothrombin Time: 16 s — ABNORMAL HIGH (ref 11.6–15.2)

## 2014-01-14 LAB — HEPATITIS PANEL, ACUTE
HCV Ab: NEGATIVE
HEP A IGM: NONREACTIVE
HEP B C IGM: NONREACTIVE
Hepatitis B Surface Ag: NEGATIVE

## 2014-01-14 LAB — OSMOLALITY: Osmolality: 287 mosm/kg (ref 275–300)

## 2014-01-14 LAB — PREGNANCY, URINE: Preg Test, Ur: NEGATIVE

## 2014-01-14 LAB — HEMOGLOBIN A1C
Hgb A1c MFr Bld: 5.4 %
Mean Plasma Glucose: 108 mg/dL

## 2014-01-14 LAB — ACETAMINOPHEN LEVEL
ACETAMINOPHEN (TYLENOL), SERUM: 15.9 ug/mL (ref 10–30)
Acetaminophen (Tylenol), Serum: <15 ug/mL (ref 10–30)

## 2014-01-14 LAB — LIPASE, BLOOD: Lipase: 32 U/L (ref 11–59)

## 2014-01-14 MED ORDER — MORPHINE SULFATE 2 MG/ML IJ SOLN
2.0000 mg | INTRAMUSCULAR | Status: DC | PRN
  Administered 2014-01-14 – 2014-01-15 (×6): 2 mg via INTRAVENOUS
  Filled 2014-01-14 (×6): qty 1

## 2014-01-14 MED ORDER — ENOXAPARIN SODIUM 40 MG/0.4ML ~~LOC~~ SOLN
40.0000 mg | SUBCUTANEOUS | Status: DC
  Administered 2014-01-14 – 2014-01-17 (×4): 40 mg via SUBCUTANEOUS
  Filled 2014-01-14 (×4): qty 0.4

## 2014-01-14 MED ORDER — ONDANSETRON HCL 4 MG/2ML IJ SOLN
4.0000 mg | Freq: Four times a day (QID) | INTRAMUSCULAR | Status: DC | PRN
  Administered 2014-01-14 – 2014-01-15 (×6): 4 mg via INTRAVENOUS
  Filled 2014-01-14 (×6): qty 2

## 2014-01-14 MED ORDER — ENOXAPARIN SODIUM 40 MG/0.4ML ~~LOC~~ SOLN: 40.0000 mg | SUBCUTANEOUS | Status: DC

## 2014-01-14 MED ORDER — ACETYLCYSTEINE 20 % IN SOLN
70.0000 mg/kg | RESPIRATORY_TRACT | Status: AC
  Administered 2014-01-14 – 2014-01-15 (×6): 5400 mg via ORAL
  Filled 2014-01-14 (×9): qty 30

## 2014-01-14 MED ORDER — INSULIN ASPART 100 UNIT/ML ~~LOC~~ SOLN
0.0000 [IU] | Freq: Three times a day (TID) | SUBCUTANEOUS | Status: DC
  Administered 2014-01-14 (×2): 1 [IU] via SUBCUTANEOUS

## 2014-01-14 MED ORDER — SODIUM CHLORIDE 0.9 % IV SOLN
INTRAVENOUS | Status: AC
  Administered 2014-01-14: 03:00:00 via INTRAVENOUS

## 2014-01-14 MED ORDER — HYDROMORPHONE HCL PF 1 MG/ML IJ SOLN
0.5000 mg | INTRAMUSCULAR | Status: AC | PRN
  Administered 2014-01-14 (×3): 0.5 mg via INTRAVENOUS
  Filled 2014-01-14 (×3): qty 1

## 2014-01-14 MED ORDER — ONDANSETRON HCL 4 MG/2ML IJ SOLN: 4.0000 mg | Freq: Three times a day (TID) | INTRAMUSCULAR | Status: DC | PRN

## 2014-01-14 MED ORDER — ONDANSETRON HCL 4 MG PO TABS
4.0000 mg | ORAL_TABLET | Freq: Four times a day (QID) | ORAL | Status: DC | PRN
  Administered 2014-01-15 – 2014-01-16 (×2): 4 mg via ORAL
  Filled 2014-01-14 (×2): qty 1

## 2014-01-14 MED ORDER — MORPHINE SULFATE 4 MG/ML IJ SOLN
4.0000 mg | Freq: Once | INTRAMUSCULAR | Status: AC
  Administered 2014-01-14: 4 mg via INTRAVENOUS
  Filled 2014-01-14: qty 1

## 2014-01-14 MED ORDER — ACETYLCYSTEINE 20 % IN SOLN
140.0000 mg/kg | Freq: Once | RESPIRATORY_TRACT | Status: AC
  Administered 2014-01-14: 10800 mg via ORAL

## 2014-01-14 MED ORDER — MORPHINE SULFATE 2 MG/ML IJ SOLN
2.0000 mg | INTRAMUSCULAR | Status: DC | PRN
  Administered 2014-01-14 (×4): 2 mg via INTRAVENOUS
  Filled 2014-01-14 (×4): qty 1

## 2014-01-14 MED ORDER — IOHEXOL 300 MG/ML  SOLN
50.0000 mL | Freq: Once | INTRAMUSCULAR | Status: AC | PRN
  Administered 2014-01-14: 50 mL via ORAL

## 2014-01-14 MED ORDER — IOHEXOL 300 MG/ML  SOLN
100.0000 mL | Freq: Once | INTRAMUSCULAR | Status: AC | PRN
  Administered 2014-01-14: 100 mL via INTRAVENOUS

## 2014-01-14 NOTE — Progress Notes (Signed)
Patient admitted to the hospital earlier this morning by Dr. Darrick Meigs.    Patient seen and examined.    She continues to have diffuse abdominal pain.  No vomiting.  Her liver function test are further elevated since admission.  RUQ ultrasound is unremarkable.  She is s/p cholecystectomy.  Hepatitis panel is in process.  She has possible tylenol toxicity and is on mucomyst.  Will request GI consultation for further input.  Bonnie Jensen

## 2014-01-14 NOTE — H&P (Addendum)
PCP:   No PCP Per Patient   Chief Complaint:  Abdominal pain  HPI:  38 year old female who  has a past medical history of Asthma; Migraine; Chronic abdominal pain; and Chronic back pain. Today presents to the ED with worsening abdominal pain along with nausea and vomiting. Patient complains of pain in the lower abdomen, bilateral tubal ligation 16 years ago. she has history of cholecystectomy as well as appendectomy. She denies diarrhea, or constipation. She moves her bowels every day. Pain she describes as constant, 10/10 in intensity. The pain has improved since she got morphine in the ED. She was found to have elevated liver enzymes, and admits to taking Tylenol over the past 3 weeks. The Tylenol level is within normal limits.  Allergies:   Allergies  Allergen Reactions  . Bee Venom Anaphylaxis  . Ketorolac Tromethamine Anaphylaxis and Other (See Comments)    Seizure type spasms  . Imitrex [Sumatriptan Base] Other (See Comments)    Chest pain  . Isometheptene-Apap-Dichloral Other (See Comments)    Chest pain  . Tylenol With Codeine #3 [Acetaminophen-Codeine] Rash      Past Medical History  Diagnosis Date  . Asthma   . Migraine   . Chronic abdominal pain   . Chronic back pain     Past Surgical History  Procedure Laterality Date  . Appendectomy    . Cholecystectomy    . Tonsillectomy    . Tubal ligation    . Brain surgery      Prior to Admission medications   Medication Sig Start Date End Date Taking? Authorizing Provider  ibuprofen (ADVIL,MOTRIN) 200 MG tablet Take 800 mg by mouth every 6 (six) hours as needed for moderate pain.   Yes Historical Provider, MD    Social History:  reports that she has been smoking Cigarettes.  She has a 15 pack-year smoking history. She has never used smokeless tobacco. She reports that she does not drink alcohol or use illicit drugs.  No family history on file.   All the positives are listed in BOLD  Review of Systems:    HEENT: Headache, blurred vision, runny nose, sore throat Neck: Hypothyroidism, hyperthyroidism,,lymphadenopathy Chest : Shortness of breath, history of COPD, Asthma Heart : Chest pain, history of coronary arterey disease GI:  Nausea, vomiting, diarrhea, constipation, GERD GU: Dysuria, urgency, frequency of urination, hematuria Neuro: Stroke, seizures, syncope Psych: Depression, anxiety, hallucinations   Physical Exam: Blood pressure 116/77, pulse 80, temperature 97.8 F (36.6 C), temperature source Oral, resp. rate 20, height _0  (1.499 m), weight 77.111 kg (170 lb), last menstrual period 12/30/2013, SpO2 100.00%. Constitutional:   Patient is a well-developed and well-nourished *female in no acute distress and cooperative with exam. Head: Normocephalic and atraumatic Mouth: Mucus membranes moist Eyes: PERRL, EOMI, conjunctivae normal Neck: Supple, No Thyromegaly Cardiovascular: RRR, S1 normal, S2 normal Pulmonary/Chest: CTAB, no wheezes, rales, or rhonchi Abdominal: Soft. Mild tenderness in both right and left lower quadrants, non-distended, bowel sounds are normal, no masses, organomegaly, or guarding present.  Neurological: A&O x3, Strenght is normal and symmetric bilaterally, cranial nerve II-XII are grossly intact, no focal motor deficit, sensory intact to light touch bilaterally.  Extremities : No Cyanosis, Clubbing or Edema   Labs on Admission:  Results for orders placed during the hospital encounter of 01/13/14 (from the past 48 hour(s))  CBC WITH DIFFERENTIAL     Status: Abnormal   Collection Time    01/13/14 11:44 PM      Result  Value Ref Range   WBC 14.7 (*) 4.0 - 10.5 K/uL   RBC 4.45  3.87 - 5.11 MIL/uL   Hemoglobin 14.5  12.0 - 15.0 g/dL   HCT 42.7  36.0 - 46.0 %   MCV 96.0  78.0 - 100.0 fL   MCH 32.6  26.0 - 34.0 pg   MCHC 34.0  30.0 - 36.0 g/dL   RDW 13.2  11.5 - 15.5 %   Platelets 312  150 - 400 K/uL   Neutrophils Relative % 77  43 - 77 %   Neutro Abs 11.4  (*) 1.7 - 7.7 K/uL   Lymphocytes Relative 16  12 - 46 %   Lymphs Abs 2.3  0.7 - 4.0 K/uL   Monocytes Relative 6  3 - 12 %   Monocytes Absolute 0.9  0.1 - 1.0 K/uL   Eosinophils Relative 1  0 - 5 %   Eosinophils Absolute 0.1  0.0 - 0.7 K/uL   Basophils Relative 0  0 - 1 %   Basophils Absolute 0.1  0.0 - 0.1 K/uL  COMPREHENSIVE METABOLIC PANEL     Status: Abnormal   Collection Time    01/13/14 11:44 PM      Result Value Ref Range   Sodium 139  137 - 147 mEq/L   Potassium 3.6 (*) 3.7 - 5.3 mEq/L   Chloride 105  96 - 112 mEq/L   CO2 18 (*) 19 - 32 mEq/L   Glucose, Bld 225 (*) 70 - 99 mg/dL   BUN 9  6 - 23 mg/dL   Creatinine, Ser 0.65  0.50 - 1.10 mg/dL   Calcium 9.5  8.4 - 10.5 mg/dL   Total Protein 7.6  6.0 - 8.3 g/dL   Albumin 3.7  3.5 - 5.2 g/dL   AST 290 (*) 0 - 37 U/L   ALT 270 (*) 0 - 35 U/L   Alkaline Phosphatase 71  39 - 117 U/L   Total Bilirubin 0.5  0.3 - 1.2 mg/dL   GFR calc non Af Amer >90  >90 mL/min   GFR calc Af Amer >90  >90 mL/min   Comment: (NOTE)     The eGFR has been calculated using the CKD EPI equation.     This calculation has not been validated in all clinical situations.     eGFR's persistently <90 mL/min signify possible Chronic Kidney     Disease.  LIPASE, BLOOD     Status: None   Collection Time    01/13/14 11:44 PM      Result Value Ref Range   Lipase 32  11 - 59 U/L  URINALYSIS, ROUTINE W REFLEX MICROSCOPIC     Status: Abnormal   Collection Time    01/13/14 11:45 PM      Result Value Ref Range   Color, Urine STRAW (*) YELLOW   APPearance CLEAR  CLEAR   Specific Gravity, Urine <1.005 (*) 1.005 - 1.030   pH 5.5  5.0 - 8.0   Glucose, UA 500 (*) NEGATIVE mg/dL   Hgb urine dipstick NEGATIVE  NEGATIVE   Bilirubin Urine NEGATIVE  NEGATIVE   Ketones, ur NEGATIVE  NEGATIVE mg/dL   Protein, ur NEGATIVE  NEGATIVE mg/dL   Urobilinogen, UA 0.2  0.0 - 1.0 mg/dL   Nitrite NEGATIVE  NEGATIVE   Leukocytes, UA NEGATIVE  NEGATIVE   Comment: MICROSCOPIC  NOT DONE ON URINES WITH NEGATIVE PROTEIN, BLOOD, LEUKOCYTES, NITRITE, OR GLUCOSE <1000 mg/dL.  PREGNANCY, URINE  Status: None   Collection Time    01/13/14 11:45 PM      Result Value Ref Range   Preg Test, Ur NEGATIVE  NEGATIVE   Comment:            THE SENSITIVITY OF THIS     METHODOLOGY IS >20 mIU/mL.  ACETAMINOPHEN LEVEL     Status: None   Collection Time    01/14/14 12:20 AM      Result Value Ref Range   Acetaminophen (Tylenol), Serum 15.9  10 - 30 ug/mL   Comment:            THERAPEUTIC CONCENTRATIONS VARY     SIGNIFICANTLY. A RANGE OF 10-30     ug/mL MAY BE AN EFFECTIVE     CONCENTRATION FOR MANY PATIENTS.     HOWEVER, SOME ARE BEST TREATED     AT CONCENTRATIONS OUTSIDE THIS     RANGE.     ACETAMINOPHEN CONCENTRATIONS     >150 ug/mL AT 4 HOURS AFTER     INGESTION AND >50 ug/mL AT 12     HOURS AFTER INGESTION ARE     OFTEN ASSOCIATED WITH TOXIC     REACTIONS.    Radiological Exams on Admission: Ct Abdomen Pelvis W Contrast  01/14/2014   CLINICAL DATA:  Low abdominal pain, cramping  EXAM: CT ABDOMEN AND PELVIS WITH CONTRAST  TECHNIQUE: Multidetector CT imaging of the abdomen and pelvis was performed using the standard protocol following bolus administration of intravenous contrast.  CONTRAST:  49m OMNIPAQUE IOHEXOL 300 MG/ML SOLN, 1038mOMNIPAQUE IOHEXOL 300 MG/ML SOLN  COMPARISON:  None.  FINDINGS: There is a 4.6 mm stable left lower lobe pulmonary nodule unchanged compared with 06/20/2011.  The liver demonstrates no focal abnormality. There is no intrahepatic or extrahepatic biliary ductal dilatation. The gallbladder is surgically absent. The spleen demonstrates no focal abnormality. The kidneys, adrenal glands and pancreas are normal. The bladder is unremarkable.  The stomach, duodenum, small intestine, and large intestine demonstrate no contrast extravasation or dilatation. There is no pneumoperitoneum, pneumatosis, or portal venous gas. There is no abdominal or pelvic  free fluid. There is no lymphadenopathy. The ovaries and uterus are unremarkable.  The abdominal aorta is normal in caliber .  There are no lytic or sclerotic osseous lesions.  IMPRESSION: No acute abdominal or pelvic pathology.   Electronically Signed   By: HeKathreen Devoid On: 01/14/2014 00:57    Assessment/Plan Principal Problem:   Transaminitis Active Problems:   Abdominal pain  Metabolic acidosis  Transaminitis ? Cause, will obtain abdominal ultrasound in a.m. Will also get hepatitis panel. Pharmacy has been consulted for possible acetaminophen overdose.  Acetylcysteine per pharmacy  Abdominal pain Patient has chronic abdominal pain, with nausea and vomiting. CT abdomen pelvis did not show any significant abnormality Will check abdominal ultrasound in a.m.  Metabolic acidosis, high anion gap Patient has high anion gap metabolic acidosis, with  gap of 16. ? Cause, will check lactic acid level, will also check salicylate level, ABG, osmolality.  Diabetes mellitus, new onset Patient has elevated blood glucose greater than 200 Will obtain hemoglobin A1c and initiate sliding scale insulin  DVT prophylaxis Lovenox  Code status: Full code  Family discussion: No family at bedside   Time Spent on Admission: 60 minutes  [facility] Pager: [phone]/14/2015, 1:45 AM  If 7PM-7AM, please contact night-coverage  [email or web address]  Password [email or web address]

## 2014-01-14 NOTE — Progress Notes (Signed)
Utilization Review Complete  

## 2014-01-14 NOTE — Progress Notes (Signed)
MEDICATION RELATED CONSULT NOTE - follow up  Pharmacy Consult for Acetylcysteine PO Indication: excessive Tylenol use  Allergies  Allergen Reactions  . Bee Venom Anaphylaxis  . Ketorolac Tromethamine Anaphylaxis and Other (See Comments)    Seizure type spasms  . Imitrex [Sumatriptan Base] Other (See Comments)    Chest pain  . Isometheptene-Apap-Dichloral Other (See Comments)    Chest pain  . Tylenol With Codeine #3 [Acetaminophen-Codeine] Rash   Patient Measurements: Height: 4\' 11"  (149.9 cm) Weight: 183 lb 10.3 oz (83.3 kg) IBW/kg (Calculated) : 43.2  Vital Signs: Temp: 98.3 F (36.8 C) (04/14 0501) Temp src: Oral (04/14 0501) BP: 94/57 mmHg (04/14 0501) Pulse Rate: 83 (04/14 0501) Intake/Output from previous day:   Intake/Output from this shift:    Labs:  Recent Labs  01/13/14 2344 01/14/14 0447  WBC 14.7* 9.3  HGB 14.5 13.4  HCT 42.7 39.1  PLT 312 270  CREATININE 0.65 0.57  ALBUMIN 3.7 3.2*  PROT 7.6 6.9  AST 290* 725*  ALT 270* 580*  ALKPHOS 71 79  BILITOT 0.5 0.8   Estimated Creatinine Clearance: 90 ml/min (by C-G formula based on Cr of 0.57).  Microbiology: No results found for this or any previous visit (from the past 720 hour(s)).  Medical History: Past Medical History  Diagnosis Date  . Asthma   . Migraine   . Chronic abdominal pain   . Chronic back pain    Medications:  Scheduled:  . sodium chloride   Intravenous STAT  . acetylcysteine  70 mg/kg Oral Q4H  . enoxaparin (LOVENOX) injection  40 mg Subcutaneous Q24H  . insulin aspart  0-9 Units Subcutaneous TID WC   Assessment: 38yo female who admits to using large amounts of Tylenol per day.  Pt has leukocytosis and transaminitis.  Asked to initiate Mucomyst Rx for Tylenol overdose.  Doses have been charted as given and pt reportedly tolerating. 01/14/2014.  Discussed with Poison Control.  Recommend to recheck coags, LFTs, and acetaminophen level tonight prior to 5th dose of Mucomyst and  report back.  Labs will determine if OK to d/c Mucomyst after 6 total doses or continue Rx.  Plan:  Mucomyst 140mg /Kg PO x 1 (loading dose) then Mucomyst 70mg /Kg po q4hrs for up to 17 doses Per Poison Control: -F/U tylenol level, coags and LFT's tonight & report back  [employer] 01/14/2014,12:13 PM

## 2014-01-14 NOTE — Progress Notes (Signed)
Poison control called to hear about patient's status.  Patient still c/o nausea and abdominal pain.  According to poison control, since labs from 1743 were acceptable to their standards, patient could be stopped from taking mucomyst.

## 2014-01-14 NOTE — Progress Notes (Signed)
MEDICATION RELATED CONSULT NOTE - follow up  Pharmacy Consult for Acetylcysteine PO Indication: excessive Tylenol use  Allergies  Allergen Reactions  . Bee Venom Anaphylaxis  . Ketorolac Tromethamine Anaphylaxis and Other (See Comments)    Seizure type spasms  . Imitrex [Sumatriptan Base] Other (See Comments)    Chest pain  . Isometheptene-Apap-Dichloral Other (See Comments)    Chest pain  . Tylenol With Codeine #3 [Acetaminophen-Codeine] Rash   Patient Measurements: Height: 4\' 11"  (149.9 cm) Weight: 183 lb 10.3 oz (83.3 kg) IBW/kg (Calculated) : 43.2  Vital Signs: Temp: 97.8 F (36.6 C) (04/14 2027) Temp src: Oral (04/14 2027) BP: 149/103 mmHg (04/14 2027) Pulse Rate: 74 (04/14 2027) Intake/Output from previous day:   Intake/Output from this shift:    Labs:  Recent Labs  01/13/14 2344 01/14/14 0447 01/14/14 1743  WBC 14.7* 9.3  --   HGB 14.5 13.4  --   HCT 42.7 39.1  --   PLT 312 270  --   CREATININE 0.65 0.57 0.68  ALBUMIN 3.7 3.2* 3.3*  PROT 7.6 6.9 6.9  AST 290* 725* 393*  ALT 270* 580* 524*  ALKPHOS 71 79 86  BILITOT 0.5 0.8 0.7   Estimated Creatinine Clearance: 90 ml/min (by C-G formula based on Cr of 0.68).    Microbiology: No results found for this or any previous visit (from the past 720 hour(s)).  Medical History: Past Medical History  Diagnosis Date  . Asthma   . Migraine   . Chronic abdominal pain   . Chronic back pain    Medications:  Scheduled:  . acetylcysteine  70 mg/kg Oral Q4H  . enoxaparin (LOVENOX) injection  40 mg Subcutaneous Q24H  . insulin aspart  0-9 Units Subcutaneous TID WC   Assessment: 38yo female who admits to using large amounts of Tylenol per day.  Pt has leukocytosis and transaminitis.  Asked to initiate Mucomyst Rx for Tylenol overdose.  Doses have been charted as given and pt reportedly tolerating. 01/14/2014.  Discussed with Poison Control.  Recommend to recheck coags, LFTs, and acetaminophen level tonight  prior to 5th dose of Mucomyst and report back.  Labs will determine if OK to d/c Mucomyst after 6 total doses or continue Rx. 01/14/2014 PM - labs called to poison control.  Plan:  OK to stop mucomyst after next dose per poison control center.  Pricilla Larsson 01/14/2014,9:02 PM

## 2014-01-14 NOTE — Care Management Note (Signed)
    Page 1 of 1   01/14/2014     2:37:57 PM   CARE MANAGEMENT NOTE 01/14/2014  Patient:  Bonnie Jensen, Bonnie Jensen   Account Number:  [id number]  Date Initiated:  01/14/2014  Documentation initiated by:  Claretha Cooper  Subjective/Objective Assessment:   Pt admitted from home where she lives with her daughter and ex-husband. Has no PCP and agrees with [facility] follow up     Action/Plan:   Anticipated DC Date:     Anticipated DC Plan:  [place]  CM consult      Choice offered to / List presented to:             Status of service:  In process, will continue to follow Medicare Important Message given?   (If response is "NO", the following Medicare IM given date fields will be blank) Date Medicare IM given:   Date Additional Medicare IM given:    Discharge Disposition:    Per UR Regulation:    If discussed at Long Length of Stay Meetings, dates discussed:    Comments:  01/14/14 Claretha Cooper RN [facility] CM

## 2014-01-14 NOTE — Progress Notes (Signed)
MEDICATION RELATED CONSULT NOTE - INITIAL   Pharmacy Consult for Acetylcysteine PO Indication: excessive Tylenol use  Allergies  Allergen Reactions  . Bee Venom Anaphylaxis  . Ketorolac Tromethamine Anaphylaxis and Other (See Comments)    Seizure type spasms  . Imitrex [Sumatriptan Base] Other (See Comments)    Chest pain  . Isometheptene-Apap-Dichloral Other (See Comments)    Chest pain  . Tylenol With Codeine #3 [Acetaminophen-Codeine] Rash   Patient Measurements: Height: 4\' 11"  (149.9 cm) Weight: 170 lb (77.111 kg) IBW/kg (Calculated) : 43.2  Vital Signs: Temp: 97.8 F (36.6 C) (04/13 2233) Temp src: Oral (04/13 2233) BP: 116/77 mmHg (04/14 0016) Pulse Rate: 80 (04/14 0016) Intake/Output from previous day:   Intake/Output from this shift:    Labs:  Recent Labs  01/13/14 2344  WBC 14.7*  HGB 14.5  HCT 42.7  PLT 312  CREATININE 0.65  ALBUMIN 3.7  PROT 7.6  AST 290*  ALT 270*  ALKPHOS 71  BILITOT 0.5   Estimated Creatinine Clearance: 86.3 ml/min (by C-G formula based on Cr of 0.65).  Microbiology: No results found for this or any previous visit (from the past 720 hour(s)).  Medical History: Past Medical History  Diagnosis Date  . Asthma   . Migraine   . Chronic abdominal pain   . Chronic back pain    Medications:  Scheduled:  . acetylcysteine  140 mg/kg Oral Once  . acetylcysteine  70 mg/kg Oral Q4H    Assessment: 38yo female who admits to using large amounts of Tylenol per day.  Pt has leukocytosis and transaminitis.  Asked to initiate Mucomyst Rx for Tylenol overdose.  Plan:  Mucomyst 140mg /Kg PO x 1 (loading dose) then Mucomyst 70mg /Kg po q4hrs x 17 doses F/U tylenol levels and LFT's F/U poison control recommendations  Jovanny Stephanie A Ares Tegtmeyer 01/14/2014,1:45 AM

## 2014-01-14 NOTE — Progress Notes (Signed)
Inpatient Diabetes Program Recommendations  AACE/ADA: New Consensus Statement on Inpatient Glycemic Control (2013)  Target Ranges:  Prepandial:   less than 140 mg/dL      Peak postprandial:   less than 180 mg/dL (1-2 hours)      Critically ill patients:  140 - 180 mg/dL  Results for Bonnie Jensen, Bonnie Jensen (MRN [id number]) as of 01/14/2014 09:11  Ref. Range 01/13/2014 23:44 01/14/2014 04:47  Glucose Latest Range: 70-99 mg/dL 225 (H) 101 (H)   Diabetes history: No Outpatient Diabetes medications: NA Current orders for Inpatient glycemic control: Novolog 0-9 units AC  Inpatient Diabetes Program Recommendations HgbA1C: A1C in process.   Note: Noted initial glucose of 225 mg/dl and new onset DM noted on H&P.  Patient has not received any insulin since arriving at the hospital and fasting glucose 101 mg/dl on labs this morning. A1C is pending. Question if initial elevated glucose related to stress or other source. Will follow up with A1C results.  Thanks, Barnie Alderman, RN, MSN, CCRN Diabetes Coordinator Inpatient Diabetes Program [phone] (Team Pager) [phone] ([place] office) [phone] [facility] office)

## 2014-01-15 DIAGNOSIS — R7401 Elevation of levels of liver transaminase levels: Secondary | ICD-10-CM

## 2014-01-15 DIAGNOSIS — R112 Nausea with vomiting, unspecified: Secondary | ICD-10-CM

## 2014-01-15 DIAGNOSIS — T391X1A Poisoning by 4-Aminophenol derivatives, accidental (unintentional), initial encounter: Secondary | ICD-10-CM

## 2014-01-15 DIAGNOSIS — R109 Unspecified abdominal pain: Secondary | ICD-10-CM

## 2014-01-15 DIAGNOSIS — B370 Candidal stomatitis: Secondary | ICD-10-CM

## 2014-01-15 DIAGNOSIS — R740 Nonspecific elevation of levels of transaminase and lactic acid dehydrogenase [LDH]: Secondary | ICD-10-CM

## 2014-01-15 LAB — COMPREHENSIVE METABOLIC PANEL
ALBUMIN: 3.1 g/dL — AB (ref 3.5–5.2)
ALT: 805 U/L — ABNORMAL HIGH (ref 0–35)
AST: 659 U/L — AB (ref 0–37)
Alkaline Phosphatase: 86 U/L (ref 39–117)
BILIRUBIN TOTAL: 0.6 mg/dL (ref 0.3–1.2)
BUN: 9 mg/dL (ref 6–23)
CHLORIDE: 107 meq/L (ref 96–112)
CO2: 22 mEq/L (ref 19–32)
CREATININE: 0.8 mg/dL (ref 0.50–1.10)
Calcium: 9.2 mg/dL (ref 8.4–10.5)
GFR calc Af Amer: >90 mL/min (ref >90)
GFR calc non Af Amer: >90 mL/min (ref >90)
Glucose, Bld: 85 mg/dL (ref 70–99)
Potassium: 4.3 mEq/L (ref 3.7–5.3)
Sodium: 141 mEq/L (ref 137–147)
TOTAL PROTEIN: 6.6 g/dL (ref 6.0–8.3)

## 2014-01-15 LAB — ACETAMINOPHEN LEVEL: Acetaminophen (Tylenol), Serum: <15 ug/mL (ref 10–30)

## 2014-01-15 LAB — TSH: TSH: 1.45 u[IU]/mL (ref 0.350–4.500)

## 2014-01-15 LAB — GLUCOSE, CAPILLARY
Glucose-Capillary: 74 mg/dL (ref 70–99)
Glucose-Capillary: 71 mg/dL (ref 70–99)

## 2014-01-15 LAB — PROTIME-INR
INR: 1.26 (ref 0.00–1.49)
Prothrombin Time: 15.5 seconds — ABNORMAL HIGH (ref 11.6–15.2)

## 2014-01-15 MED ORDER — MORPHINE SULFATE 2 MG/ML IJ SOLN
1.0000 mg | INTRAMUSCULAR | Status: AC | PRN
  Administered 2014-01-15 – 2014-01-16 (×3): 1 mg via INTRAVENOUS
  Filled 2014-01-15 (×3): qty 1

## 2014-01-15 MED ORDER — OXYCODONE HCL 5 MG PO TABS
5.0000 mg | ORAL_TABLET | Freq: Four times a day (QID) | ORAL | Status: DC | PRN
  Administered 2014-01-15 – 2014-01-16 (×3): 5 mg via ORAL
  Filled 2014-01-15 (×3): qty 1

## 2014-01-15 MED ORDER — NYSTATIN 100000 UNIT/ML MT SUSP
5.0000 mL | Freq: Four times a day (QID) | OROMUCOSAL | Status: DC
  Administered 2014-01-15 – 2014-01-17 (×8): 500000 [IU] via ORAL
  Filled 2014-01-15 (×8): qty 5

## 2014-01-15 MED ORDER — ACETYLCYSTEINE 20 % IN SOLN
70.0000 mg/kg | RESPIRATORY_TRACT | Status: DC
  Administered 2014-01-15 – 2014-01-17 (×12): 5400 mg via ORAL
  Filled 2014-01-15 (×31): qty 30

## 2014-01-15 NOTE — Progress Notes (Signed)
PROGRESS NOTE  KAMYIA THOMASON HUT:[id number] DOB: Jan 28, 1976 DOA: 01/13/2014 PCP: No PCP Per Patient  Summary: 38 year old woman presented with nausea, vomiting and lower, pain. Initial evaluation revealed elevated LFTs, history of Tylenol use over the last 3 weeks, normal Tylenol level. She started on Mucomyst.  Assessment/Plan: 1. Acute transaminitis. Based on history highly suspicious for Tylenol injury. Hepatitis panel negative. Reported fever prior to admission, none since admission. Imaging of the abdomen and pelvis by CT and right upper quadrant by ultrasound unremarkable except for mild dilatation of the common bile duct. 2. Acute/subacute Tylenol overdose suspected. Tylenol level was therapeutic on admission, followup levels negative. Patient versus significant Tylenol use over the last several weeks. 3. Chronic generalized abdominal pain. She is managed this with Tylenol as an outpatient. She reports this is long-standing, per 24 years. CT of the abdomen and pelvis was unremarkable. Right upper quadrant ultrasound unremarkable. Urine pregnancy test negative. 4. Oral candidiasis. Etiology unclear. 5. History cholecystectomy, appendectomy 6. Chronic abdominal pain, chronic back pain   Based on elevated transaminases, poison control has recommended resuming Mucomyst. Repeat CMP, PT/INR in the morning. Total bilirubin, INR, albumin stable.  Follow GI recommendations.  Check HIV.  Chronic abdominal pain appears to be stable without complicating features. Imaging unremarkable. Exam benign. Discussed with patient transition to oral pain control.  Nystatin   Code Status: full code DVT prophylaxis: Lovenox Family Communication: Discussed with husband, daughter at bedside with patient's permission. Disposition Plan: Home when improved.  Murray Hodgkins, MD  [facility]  Pager [phone] If 7PM-7AM, please contact night-coverage at [email or web address], password [email or web address] 01/15/2014, 12:35  PM  LOS: 2 days   Consultants:  GI  Procedures:    Antibiotics:    HPI/Subjective: Mucomyst was discontinued last night.  Patient complains of chronic generalized abdominal pain, long-standing, currently in the lower abdomen with some bilateral radiation to the flank.  Objective: Filed Vitals:   01/14/14 0501 01/14/14 1415 01/14/14 2027 01/15/14 0530  BP: 94/57 128/87 149/103 131/86  Pulse: 83 78 74 68  Temp: 98.3 F (36.8 C) 97.4 F (36.3 C) 97.8 F (36.6 C) 98.4 F (36.9 C)  TempSrc: Oral Oral Oral Oral  Resp: 16 18 18 16   Height:      Weight:      SpO2: 98% 98% 100% 96%    Intake/Output Summary (Last 24 hours) at 01/15/14 1235 Last data filed at 01/14/14 1800  Gross per 24 hour  Intake    360 ml  Output      0 ml  Net    360 ml     Filed Weights   01/13/14 2233 01/14/14 0257  Weight: 77.111 kg (170 lb) 83.3 kg (183 lb 10.3 oz)    Exam:   Afebrile, vital signs are stable. No hypoxia.   Gen. Appears calm and comfortable.   Psychiatric. Speech fluent and clear. Grossly normal mood and affect.  Cardiovascular regular rate and rhythm. No murmur, rub or gallop. No lower extremity edema. Bilateral 2+ dorsalis pedis pulses.  Respiratory clear to auscultation bilaterally. No wheezes, rales or rhonchi. Normal respiratory effort.  Abdomen soft, nontender, nondistended. There is no rebound or guarding. No costovertebral angle tenderness bilaterally.  Data Reviewed:  Fasting blood sugar 85   Basic metabolic panel unremarkable. AST, ALT have increased, 659/805 respectively. INR stable, improved, 1.26  Subsequent Tylenol levels negative.   CT of the abdomen and pelvis no acute abnormality.    ultrasound right upper quadrant no acute findings. Mild increase  of caliber of common bile duct.   Scheduled Meds: . acetylcysteine  70 mg/kg Oral Q4H  . enoxaparin (LOVENOX) injection  40 mg Subcutaneous Q24H  . insulin aspart  0-9 Units Subcutaneous TID WC    Continuous Infusions:   Principal Problem:   Transaminitis Active Problems:   Abdominal pain   Tylenol overdose   Oral candidiasis   Time spent 20 minutes

## 2014-01-15 NOTE — Progress Notes (Signed)
[name] NOTE  Pharmacy Consult for Acetylcysteine PO Indication: excessive Tylenol use  Allergies  Allergen Reactions  . Bee Venom Anaphylaxis  . Ketorolac Tromethamine Anaphylaxis and Other (See Comments)    Seizure type spasms  . Imitrex [Sumatriptan Base] Other (See Comments)    Chest pain  . Isometheptene-Apap-Dichloral Other (See Comments)    Chest pain  . Tylenol With Codeine #3 [Acetaminophen-Codeine] Rash   Patient Measurements: Height: 4\' 11"  (149.9 cm) Weight: 183 lb 10.3 oz (83.3 kg) IBW/kg (Calculated) : 43.2  Vital Signs: Temp: 98.4 F (36.9 C) (04/15 0530) Temp src: Oral (04/15 0530) BP: 131/86 mmHg (04/15 0530) Pulse Rate: 68 (04/15 0530) Intake/Output from previous day: 04/14 0701 - 04/15 0700 In: 540 [P.O.:540] Out: -  Intake/Output from this shift:    Labs:  Recent Labs  01/13/14 2344 01/14/14 0447 01/14/14 1743 01/15/14 0542  WBC 14.7* 9.3  --   --   HGB 14.5 13.4  --   --   HCT 42.7 39.1  --   --   PLT 312 270  --   --   CREATININE 0.65 0.57 0.68 0.80  ALBUMIN 3.7 3.2* 3.3* 3.1*  PROT 7.6 6.9 6.9 6.6  AST 290* 725* 393* 659*  ALT 270* 580* 524* 805*  ALKPHOS 71 79 86 86  BILITOT 0.5 0.8 0.7 0.6   Estimated Creatinine Clearance: 90 ml/min (by C-G formula based on Cr of 0.8).    Microbiology: No results found for this or any previous visit (from the past 720 hour(s)).  Medical History: Past Medical History  Diagnosis Date  . Asthma   . Migraine   . Chronic abdominal pain   . Chronic back pain    Medications:  Scheduled:  . acetylcysteine  70 mg/kg Oral Q4H  . enoxaparin (LOVENOX) injection  40 mg Subcutaneous Q24H  . insulin aspart  0-9 Units Subcutaneous TID WC   Assessment: 38yo female who admits to using large amounts of Tylenol on a routine daily basis.  Pt has leukocytosis and transaminitis.  Asked to initiate Mucomyst Rx for Tylenol overdose.  Doses have been charted as given and pt reportedly  tolerating. 4/14 AM: Discussed with Poison Control.  Recommend to recheck coags, LFTs, and acetaminophen level tonight prior to 5th dose of Mucomyst and report back.  Labs will determine if OK to d/c Mucomyst after 6 total doses or continue Rx. 4/14 PM: - labs called to poison control.  Mucomyst stopped.  4/15 AM:  [facility] called.  LFTs rising off Mucomyst.  Recommended to restart at previous dose.  Monitor LFTs, protime, electrolytes, and Scr with 5th dose & q24h thereafter per Poison Control Protocol  Plan:  Resume oral Mucomyst 70mg /kg q4h Check Cmet & PT/INR in am   Bonnie Jensen Bonnie Jensen 01/15/2014,11:13 AM

## 2014-01-15 NOTE — Consult Note (Signed)
Reason for Consult:Elevated liver enzymes Referring Physician: Hospitalist services  Bonnie Jensen is an 38 y.o. female.  HPI: Admitted thru the ED yesterday. She c/o lower abdominal pain radiating into her lower back. She had been hurting for approximately 2 weeks. She also c/o nausea and vomiting. She says she had similar episode but was a "kidney infection". She tells me this am she feels bad. She rates her pain at a 9. She underwent a cholecystectomy about 10 yrs ago for non-functioning GB. No hx of gallstones.  She does tell me she had been taking Tylenol ES x 4-8 a day for several weeks. No hx of IV drugs.  She has 2 tattoos. She says her appetite has been poor. No weight loss. She says she has actually gained weight. She usually has a BM daily which are regular. She says now she has diarrhea after eating. She tells me she had a fever of 103 a few days before admission. She thought she had a virus. LMP 2 weeks ago. She is not sexually active.  Hepatitis Panel is negative.  She has not been out of the country.  Past Medical History  Diagnosis Date  . Asthma   . Migraine   . Chronic abdominal pain   . Chronic back pain     Past Surgical History  Procedure Laterality Date  . Appendectomy    . Cholecystectomy    . Tonsillectomy    . Tubal ligation    . Brain surgery      History reviewed. No pertinent family history.  Social History:  reports that she has been smoking Cigarettes.  She has a 7.5 pack-year smoking history. She has never used smokeless tobacco. She reports that she does not drink alcohol or use illicit drugs.  Allergies:  Allergies  Allergen Reactions  . Bee Venom Anaphylaxis  . Ketorolac Tromethamine Anaphylaxis and Other (See Comments)    Seizure type spasms  . Imitrex [Sumatriptan Base] Other (See Comments)    Chest pain  . Isometheptene-Apap-Dichloral Other (See Comments)    Chest pain  . Tylenol With Codeine #3 [Acetaminophen-Codeine] Rash     Medications: I have reviewed the patient's current medications.  Results for orders placed during the hospital encounter of 01/13/14 (from the past 48 hour(s))  CBC WITH DIFFERENTIAL     Status: Abnormal   Collection Time    01/13/14 11:44 PM      Result Value Ref Range   WBC 14.7 (*) 4.0 - 10.5 K/uL   RBC 4.45  3.87 - 5.11 MIL/uL   Hemoglobin 14.5  12.0 - 15.0 g/dL   HCT 42.7  36.0 - 46.0 %   MCV 96.0  78.0 - 100.0 fL   MCH 32.6  26.0 - 34.0 pg   MCHC 34.0  30.0 - 36.0 g/dL   RDW 13.2  11.5 - 15.5 %   Platelets 312  150 - 400 K/uL   Neutrophils Relative % 77  43 - 77 %   Neutro Abs 11.4 (*) 1.7 - 7.7 K/uL   Lymphocytes Relative 16  12 - 46 %   Lymphs Abs 2.3  0.7 - 4.0 K/uL   Monocytes Relative 6  3 - 12 %   Monocytes Absolute 0.9  0.1 - 1.0 K/uL   Eosinophils Relative 1  0 - 5 %   Eosinophils Absolute 0.1  0.0 - 0.7 K/uL   Basophils Relative 0  0 - 1 %   Basophils Absolute 0.1  0.0 -  0.1 K/uL  COMPREHENSIVE METABOLIC PANEL     Status: Abnormal   Collection Time    01/13/14 11:44 PM      Result Value Ref Range   Sodium 139  137 - 147 mEq/L   Potassium 3.6 (*) 3.7 - 5.3 mEq/L   Chloride 105  96 - 112 mEq/L   CO2 18 (*) 19 - 32 mEq/L   Glucose, Bld 225 (*) 70 - 99 mg/dL   BUN 9  6 - 23 mg/dL   Creatinine, Ser 0.65  0.50 - 1.10 mg/dL   Calcium 9.5  8.4 - 10.5 mg/dL   Total Protein 7.6  6.0 - 8.3 g/dL   Albumin 3.7  3.5 - 5.2 g/dL   AST 290 (*) 0 - 37 U/L   ALT 270 (*) 0 - 35 U/L   Alkaline Phosphatase 71  39 - 117 U/L   Total Bilirubin 0.5  0.3 - 1.2 mg/dL   GFR calc non Af Amer >90  >90 mL/min   GFR calc Af Amer >90  >90 mL/min   Comment: (NOTE)     The eGFR has been calculated using the CKD EPI equation.     This calculation has not been validated in all clinical situations.     eGFR's persistently <90 mL/min signify possible Chronic Kidney     Disease.  LIPASE, BLOOD     Status: None   Collection Time    01/13/14 11:44 PM      Result Value Ref Range    Lipase 32  11 - 59 U/L  URINALYSIS, ROUTINE W REFLEX MICROSCOPIC     Status: Abnormal   Collection Time    01/13/14 11:45 PM      Result Value Ref Range   Color, Urine STRAW (*) YELLOW   APPearance CLEAR  CLEAR   Specific Gravity, Urine <1.005 (*) 1.005 - 1.030   pH 5.5  5.0 - 8.0   Glucose, UA 500 (*) NEGATIVE mg/dL   Hgb urine dipstick NEGATIVE  NEGATIVE   Bilirubin Urine NEGATIVE  NEGATIVE   Ketones, ur NEGATIVE  NEGATIVE mg/dL   Protein, ur NEGATIVE  NEGATIVE mg/dL   Urobilinogen, UA 0.2  0.0 - 1.0 mg/dL   Nitrite NEGATIVE  NEGATIVE   Leukocytes, UA NEGATIVE  NEGATIVE   Comment: MICROSCOPIC NOT DONE ON URINES WITH NEGATIVE PROTEIN, BLOOD, LEUKOCYTES, NITRITE, OR GLUCOSE <1000 mg/dL.  PREGNANCY, URINE     Status: None   Collection Time    01/13/14 11:45 PM      Result Value Ref Range   Preg Test, Ur NEGATIVE  NEGATIVE   Comment:            THE SENSITIVITY OF THIS     METHODOLOGY IS >20 mIU/mL.  ACETAMINOPHEN LEVEL     Status: None   Collection Time    01/14/14 12:20 AM      Result Value Ref Range   Acetaminophen (Tylenol), Serum 15.9  10 - 30 ug/mL   Comment:            THERAPEUTIC CONCENTRATIONS VARY     SIGNIFICANTLY. A RANGE OF 10-30     ug/mL MAY BE AN EFFECTIVE     CONCENTRATION FOR MANY PATIENTS.     HOWEVER, SOME ARE BEST TREATED     AT CONCENTRATIONS OUTSIDE THIS     RANGE.     ACETAMINOPHEN CONCENTRATIONS     >150 ug/mL AT 4 HOURS AFTER     INGESTION AND >  50 ug/mL AT 12     HOURS AFTER INGESTION ARE     OFTEN ASSOCIATED WITH TOXIC     REACTIONS.  HEMOGLOBIN A1C     Status: None   Collection Time    01/14/14  1:52 AM      Result Value Ref Range   Hemoglobin A1C 5.4  <5.7 %   Comment: (NOTE)                                                                               According to the ADA Clinical Practice Recommendations for 2011, when     HbA1c is used as a screening test:      >=6.5%   Diagnostic of Diabetes Mellitus               (if abnormal  result is confirmed)     5.7-6.4%   Increased risk of developing Diabetes Mellitus     References:Diagnosis and Classification of Diabetes Mellitus,Diabetes     KVQQ,5956,38(VFIEP 1):S62-S69 and Standards of Medical Care in             Diabetes - 2011,Diabetes PIRJ,1884,16 (Suppl 1):S11-S61.   Mean Plasma Glucose 108  <117 mg/dL   Comment: Performed at [facility], ACUTE     Status: None   Collection Time    01/14/14  1:57 AM      Result Value Ref Range   Hepatitis B Surface Ag NEGATIVE  NEGATIVE   HCV Ab NEGATIVE  NEGATIVE   Hep A IgM NON REACTIVE  NON REACTIVE   Hep B C IgM NON REACTIVE  NON REACTIVE   Comment: (NOTE)     High levels of Hepatitis B Core IgM antibody are detectable     during the acute stage of Hepatitis B. This antibody is used     to differentiate current from past HBV infection.     Performed at [facility]     Status: Abnormal   Collection Time    01/14/14  1:57 AM      Result Value Ref Range   Salicylate Lvl <6.0 (*) 2.8 - 20.0 mg/dL  LACTIC ACID, PLASMA     Status: Abnormal   Collection Time    01/14/14  1:58 AM      Result Value Ref Range   Lactic Acid, Venous 3.4 (*) 0.5 - 2.2 mmol/L  BLOOD GAS, ARTERIAL     Status: Abnormal   Collection Time    01/14/14  2:30 AM      Result Value Ref Range   Delivery systems ROOM AIR     pH, Arterial 7.365  7.350 - 7.450   pCO2 arterial 35.2  35.0 - 45.0 mmHg   pO2, Arterial 89.7  80.0 - 100.0 mmHg   Bicarbonate 19.6 (*) 20.0 - 24.0 mEq/L   TCO2 17.2  0 - 100 mmol/L   Acid-base deficit 4.8 (*) 0.0 - 2.0 mmol/L   O2 Saturation 94.8     Patient temperature 37.0     Collection site RIGHT RADIAL     Drawn by [id number]     Sample type ARTERIAL  Allens test (pass/fail) PASS  PASS  OSMOLALITY     Status: None   Collection Time    01/14/14  4:47 AM      Result Value Ref Range   Osmolality 287  275 - 300 mOsm/kg   Comment: Performed at [facility]  CBC      Status: None   Collection Time    01/14/14  4:47 AM      Result Value Ref Range   WBC 9.3  4.0 - 10.5 K/uL   RBC 4.07  3.87 - 5.11 MIL/uL   Hemoglobin 13.4  12.0 - 15.0 g/dL   HCT 39.1  36.0 - 46.0 %   MCV 96.1  78.0 - 100.0 fL   MCH 32.9  26.0 - 34.0 pg   MCHC 34.3  30.0 - 36.0 g/dL   RDW 13.3  11.5 - 15.5 %   Platelets 270  150 - 400 K/uL  COMPREHENSIVE METABOLIC PANEL     Status: Abnormal   Collection Time    01/14/14  4:47 AM      Result Value Ref Range   Sodium 142  137 - 147 mEq/L   Potassium 4.2  3.7 - 5.3 mEq/L   Chloride 105  96 - 112 mEq/L   CO2 23  19 - 32 mEq/L   Glucose, Bld 101 (*) 70 - 99 mg/dL   BUN 7  6 - 23 mg/dL   Creatinine, Ser 0.57  0.50 - 1.10 mg/dL   Calcium 8.6  8.4 - 10.5 mg/dL   Total Protein 6.9  6.0 - 8.3 g/dL   Albumin 3.2 (*) 3.5 - 5.2 g/dL   AST 725 (*) 0 - 37 U/L   ALT 580 (*) 0 - 35 U/L   Alkaline Phosphatase 79  39 - 117 U/L   Total Bilirubin 0.8  0.3 - 1.2 mg/dL   GFR calc non Af Amer >90  >90 mL/min   GFR calc Af Amer >90  >90 mL/min   Comment: (NOTE)     The eGFR has been calculated using the CKD EPI equation.     This calculation has not been validated in all clinical situations.     eGFR's persistently <90 mL/min signify possible Chronic Kidney     Disease.  PROTIME-INR     Status: Abnormal   Collection Time    01/14/14  4:47 AM      Result Value Ref Range   Prothrombin Time 16.8 (*) 11.6 - 15.2 seconds   INR 1.40  0.00 - 1.49  GLUCOSE, CAPILLARY     Status: Abnormal   Collection Time    01/14/14  7:37 AM      Result Value Ref Range   Glucose-Capillary 102 (*) 70 - 99 mg/dL   Comment 1 Notify RN     Comment 2 Documented in Chart    GLUCOSE, CAPILLARY     Status: Abnormal   Collection Time    01/14/14 11:21 AM      Result Value Ref Range   Glucose-Capillary 136 (*) 70 - 99 mg/dL   Comment 1 Notify RN     Comment 2 Documented in Chart    GLUCOSE, CAPILLARY     Status: None   Collection Time    01/14/14  4:04 PM       Result Value Ref Range   Glucose-Capillary 88  70 - 99 mg/dL   Comment 1 Notify RN     Comment 2 Documented in  Chart    ACETAMINOPHEN LEVEL     Status: None   Collection Time    01/14/14  5:43 PM      Result Value Ref Range   Acetaminophen (Tylenol), Serum <15.0  10 - 30 ug/mL   Comment:            THERAPEUTIC CONCENTRATIONS VARY     SIGNIFICANTLY. A RANGE OF 10-30     ug/mL MAY BE AN EFFECTIVE     CONCENTRATION FOR MANY PATIENTS.     HOWEVER, SOME ARE BEST TREATED     AT CONCENTRATIONS OUTSIDE THIS     RANGE.     ACETAMINOPHEN CONCENTRATIONS     >150 ug/mL AT 4 HOURS AFTER     INGESTION AND >50 ug/mL AT 12     HOURS AFTER INGESTION ARE     OFTEN ASSOCIATED WITH TOXIC     REACTIONS.  PROTIME-INR     Status: Abnormal   Collection Time    01/14/14  5:43 PM      Result Value Ref Range   Prothrombin Time 16.0 (*) 11.6 - 15.2 seconds   INR 1.31  0.00 - 1.49  COMPREHENSIVE METABOLIC PANEL     Status: Abnormal   Collection Time    01/14/14  5:43 PM      Result Value Ref Range   Sodium 139  137 - 147 mEq/L   Potassium 4.1  3.7 - 5.3 mEq/L   Chloride 102  96 - 112 mEq/L   CO2 24  19 - 32 mEq/L   Glucose, Bld 99  70 - 99 mg/dL   BUN 10  6 - 23 mg/dL   Creatinine, Ser 0.68  0.50 - 1.10 mg/dL   Calcium 8.4  8.4 - 10.5 mg/dL   Total Protein 6.9  6.0 - 8.3 g/dL   Albumin 3.3 (*) 3.5 - 5.2 g/dL   AST 393 (*) 0 - 37 U/L   ALT 524 (*) 0 - 35 U/L   Alkaline Phosphatase 86  39 - 117 U/L   Total Bilirubin 0.7  0.3 - 1.2 mg/dL   GFR calc non Af Amer >90  >90 mL/min   GFR calc Af Amer >90  >90 mL/min   Comment: (NOTE)     The eGFR has been calculated using the CKD EPI equation.     This calculation has not been validated in all clinical situations.     eGFR's persistently <90 mL/min signify possible Chronic Kidney     Disease.  GLUCOSE, CAPILLARY     Status: None   Collection Time    01/14/14  8:32 PM      Result Value Ref Range   Glucose-Capillary 86  70 - 99 mg/dL   COMPREHENSIVE METABOLIC PANEL     Status: Abnormal   Collection Time    01/15/14  5:42 AM      Result Value Ref Range   Sodium 141  137 - 147 mEq/L   Potassium 4.3  3.7 - 5.3 mEq/L   Chloride 107  96 - 112 mEq/L   CO2 22  19 - 32 mEq/L   Glucose, Bld 85  70 - 99 mg/dL   BUN 9  6 - 23 mg/dL   Creatinine, Ser 0.80  0.50 - 1.10 mg/dL   Calcium 9.2  8.4 - 10.5 mg/dL   Total Protein 6.6  6.0 - 8.3 g/dL   Albumin 3.1 (*) 3.5 - 5.2 g/dL   AST 659 (*) 0 -  37 U/L   ALT 805 (*) 0 - 35 U/L   Alkaline Phosphatase 86  39 - 117 U/L   Total Bilirubin 0.6  0.3 - 1.2 mg/dL   GFR calc non Af Amer >90  >90 mL/min   GFR calc Af Amer >90  >90 mL/min   Comment: (NOTE)     The eGFR has been calculated using the CKD EPI equation.     This calculation has not been validated in all clinical situations.     eGFR's persistently <90 mL/min signify possible Chronic Kidney     Disease.  ACETAMINOPHEN LEVEL     Status: None   Collection Time    01/15/14  5:42 AM      Result Value Ref Range   Acetaminophen (Tylenol), Serum <15.0  10 - 30 ug/mL   Comment:            THERAPEUTIC CONCENTRATIONS VARY     SIGNIFICANTLY. A RANGE OF 10-30     ug/mL MAY BE AN EFFECTIVE     CONCENTRATION FOR MANY PATIENTS.     HOWEVER, SOME ARE BEST TREATED     AT CONCENTRATIONS OUTSIDE THIS     RANGE.     ACETAMINOPHEN CONCENTRATIONS     >150 ug/mL AT 4 HOURS AFTER     INGESTION AND >50 ug/mL AT 12     HOURS AFTER INGESTION ARE     OFTEN ASSOCIATED WITH TOXIC     REACTIONS.  PROTIME-INR     Status: Abnormal   Collection Time    01/15/14  5:42 AM      Result Value Ref Range   Prothrombin Time 15.5 (*) 11.6 - 15.2 seconds   INR 1.26  0.00 - 1.49  GLUCOSE, CAPILLARY     Status: None   Collection Time    01/15/14  7:32 AM      Result Value Ref Range   Glucose-Capillary 74  70 - 99 mg/dL   Comment 1 Notify RN     Comment 2 Documented in Chart      US Abdomen Complete  01/14/2014   CLINICAL DATA:  Transaminitis.   Abdominal pain.  EXAM: ULTRASOUND ABDOMEN COMPLETE  COMPARISON:  01/14/2014  FINDINGS: Gallbladder:  Previous cholecystectomy  Common bile duct:  Diameter: 8.2 mm.  Liver:  No focal lesion identified. Within normal limits in parenchymal echogenicity.  IVC:  No abnormality visualized.  Pancreas:  Visualized portion unremarkable.  Spleen:  Size and appearance within normal limits.  Right Kidney:  Length: 13.6 cm. Echogenicity within normal limits. No mass or hydronephrosis visualized.  Left Kidney:  Length: 10.9 cm. Echogenicity within normal limits. No mass or hydronephrosis visualized.  Abdominal aorta:  No aneurysm visualized.  Other findings:  None.  IMPRESSION: 1. No acute findings. 2. Prior cholecystectomy with mild increase caliber of the common bile duct.   Electronically Signed   By: Kerby Moors M.D.   On: 01/14/2014 11:30   Ct Abdomen Pelvis W Contrast  01/14/2014   CLINICAL DATA:  Low abdominal pain, cramping  EXAM: CT ABDOMEN AND PELVIS WITH CONTRAST  TECHNIQUE: Multidetector CT imaging of the abdomen and pelvis was performed using the standard protocol following bolus administration of intravenous contrast.  CONTRAST:  62m OMNIPAQUE IOHEXOL 300 MG/ML SOLN, 1027mOMNIPAQUE IOHEXOL 300 MG/ML SOLN  COMPARISON:  None.  FINDINGS: There is a 4.6 mm stable left lower lobe pulmonary nodule unchanged compared with 06/20/2011.  The liver demonstrates no focal abnormality. There is  no intrahepatic or extrahepatic biliary ductal dilatation. The gallbladder is surgically absent. The spleen demonstrates no focal abnormality. The kidneys, adrenal glands and pancreas are normal. The bladder is unremarkable.  The stomach, duodenum, small intestine, and large intestine demonstrate no contrast extravasation or dilatation. There is no pneumoperitoneum, pneumatosis, or portal venous gas. There is no abdominal or pelvic free fluid. There is no lymphadenopathy. The ovaries and uterus are unremarkable.  The abdominal  aorta is normal in caliber .  There are no lytic or sclerotic osseous lesions.  IMPRESSION: No acute abdominal or pelvic pathology.   Electronically Signed   By: Kathreen Devoid   On: 01/14/2014 00:57    ROS Blood pressure 131/86, pulse 68, temperature 98.4 F (36.9 C), temperature source Oral, resp. rate 16, height _0  (1.499 m), weight 183 lb 10.3 oz (83.3 kg), last menstrual period 12/30/2013, SpO2 96.00%. Physical Exam Alert and oriented. Skin warm and dry. Oral mucosa is moist.   . Sclera anicteric, conjunctivae is pink. Thyroid not enlarged. No cervical lymphadenopathy. Lungs clear. Heart regular rate and rhythm.  Abdomen is soft. Bowel sounds are positive. No hepatomegaly. No abdominal masses felt. Tenderness rt lower quadrant and tenderness left upper quadrant..  No edema to lower extremities.  Assessment/Plan: Elevated transaminases ? Etiology. Tylenol level was normal.  Hepatitis panel normal. Fever of 103 before admission. US revealed slightly dilated CBD.  Recheck Cmet in am I will discuss with Dr. Cherylynn Ridges 01/15/2014, 8:14 AM    GI attending note. Patient interviewed and examined. Imaging studies(CT and ultrasound) reviewed with Dr. Lavonia Dana. Briefly patient is 37 year old Caucasian female who presents with 2 week history of pain across her lower abdomen increasing in intensity the day of admission associated vomiting and temp prior to admission. She states she's had these symptoms for over 4 years since she had C. difficile colitis. On workup she was found to have significant elevation of AST and ALT. Patient has been using Tylenol on a regular basis lately she's been using as many as 5 g of Tylenol per day but not daily. Tylenol/acetaminophen level on admission was not in the toxic range but she has been treated with Mucomyst just in case. Viral markers for Hep A,B and C are negative. 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 She believes she  was jaundiced at that time. CBD and CHD or 7-8 mm in diameter with somewhat prominent intrahepatic radicals no evidence of choledocholithiasis. Patient does not appear to be in acute distress. Abdominal exam reveals soft abdomen with and is in epigastrium and right upper quadrant felt to be liver edge. Spleen is not palpable.  Assessment; Acute hepatocellular injury may be secondary to Tylenol or acetaminophen; given her presentation biliary colic remains in differential diagnosis. Lower abdominal pain is most likely due to IBS since she has been experiencing postprandial diarrhea. Next In her management will depend on LFTs and INR in a.m. If AST and ALT keep rising will proceed with further workup to include an MRCP and also to look for other causes of hepatocellular injury. Regarding her lower abdominal pain and postprandial bowel movements will begin anti-spasmodic therapy once LFTs improve. Patient's condition reviewed with Dr. Carlean Jews. Patient will be reevaluated in the a.m.

## 2014-01-16 DIAGNOSIS — T391X1A Poisoning by 4-Aminophenol derivatives, accidental (unintentional), initial encounter: Principal | ICD-10-CM

## 2014-01-16 LAB — PROTIME-INR
INR: 0.92 (ref 0.00–1.49)
PROTHROMBIN TIME: 12.2 s (ref 11.6–15.2)

## 2014-01-16 LAB — COMPREHENSIVE METABOLIC PANEL
ALT: 946 U/L — AB (ref 0–35)
AST: 400 U/L — ABNORMAL HIGH (ref 0–37)
Albumin: 3.3 g/dL — ABNORMAL LOW (ref 3.5–5.2)
Alkaline Phosphatase: 94 U/L (ref 39–117)
BILIRUBIN TOTAL: 0.4 mg/dL (ref 0.3–1.2)
BUN: 11 mg/dL (ref 6–23)
CHLORIDE: 104 meq/L (ref 96–112)
CO2: 21 meq/L (ref 19–32)
Calcium: 9.6 mg/dL (ref 8.4–10.5)
Creatinine, Ser: 0.66 mg/dL (ref 0.50–1.10)
GFR calc non Af Amer: >90 mL/min (ref >90)
Glucose, Bld: 101 mg/dL — ABNORMAL HIGH (ref 70–99)
Potassium: 4 mEq/L (ref 3.7–5.3)
Sodium: 141 mEq/L (ref 137–147)
Total Protein: 7.4 g/dL (ref 6.0–8.3)

## 2014-01-16 LAB — HIV ANTIBODY (ROUTINE TESTING W REFLEX): HIV 1&2 Ab, 4th Generation: NONREACTIVE

## 2014-01-16 LAB — SEDIMENTATION RATE: Sed Rate: 27 mm/hr — ABNORMAL HIGH (ref 0–22)

## 2014-01-16 MED ORDER — OXYCODONE HCL 5 MG PO TABS
10.0000 mg | ORAL_TABLET | Freq: Four times a day (QID) | ORAL | Status: DC | PRN
  Administered 2014-01-16 – 2014-01-17 (×4): 10 mg via ORAL
  Filled 2014-01-16 (×4): qty 2

## 2014-01-16 NOTE — Progress Notes (Addendum)
Patient ID: Bonnie Jensen, female   DOB: 1975/11/14, 38 y.o.   MRN: [id number] Alert. States she is having lower abdominal pain radiating into her back. She says she has the pain "all the time". Did not eat breakfast. Says she is not hungry.  No fever since admission.    Had a BM yesterday. Some nausea, but no vomiting.   Filed Vitals:   01/15/14 0530 01/15/14 1506 01/15/14 2019 01/16/14 0431  BP: 131/86 105/57 112/74 138/88  Pulse: 68 75 72 74  Temp: 98.4 F (36.9 C) 98.2 F (36.8 C) 98.3 F (36.8 C) 97.6 F (36.4 C)  TempSrc: Oral  Oral Oral  Resp: 16 18 18 18   Height:      Weight:      SpO2: 96% 98% 97% 97%   CMP     Component Value Date/Time   NA 141 01/16/2014 0658   K 4.0 01/16/2014 0658   CL 104 01/16/2014 0658   CO2 21 01/16/2014 0658   GLUCOSE 101* 01/16/2014 0658   BUN 11 01/16/2014 0658   CREATININE 0.66 01/16/2014 0658   CALCIUM 9.6 01/16/2014 0658   PROT 7.4 01/16/2014 0658   ALBUMIN 3.3* 01/16/2014 0658   AST 400* 01/16/2014 0658   ALT 946* 01/16/2014 0658   ALKPHOS 94 01/16/2014 0658   BILITOT 0.4 01/16/2014 0658   GFRNONAA >90 01/16/2014 0658   GFRAA >90 01/16/2014 0658   Lung clear. Heart rate regular. Skin slightly moist. Warm to the touch but not hot.  Abdomen soft, obese. BS+. Tenderness lower abdomen radiating into back. There is no tenderness rt upper quadrant.  Assessment/Plan: Elevated transaminases. Hepatitis panel negative. Liver enzymes are coming down. HIV pending. Auto immune hepatitis needs to be ruled out.  Will continue to monitor. Will discuss with Dr. Laural Golden. SMA,ANA, sedrate.

## 2014-01-16 NOTE — Progress Notes (Signed)
PROGRESS NOTE  Bonnie Jensen XBD:[id number] DOB: 29-Jan-1976 DOA: 01/13/2014 PCP: No PCP Per Patient  Summary: 38 year old woman presented with nausea, vomiting and lower, pain. Initial evaluation revealed elevated LFTs, history of Tylenol use over the last 3 weeks, normal Tylenol level. She started on Mucomyst.  Assessment/Plan: 1. Acute transaminitis. AST better, ALT higher. Based on history highly suspicious for Tylenol injury. Hepatitis panel negative. Reported fever prior to admission, none since admission. Imaging of the abdomen and pelvis by CT and right upper quadrant by ultrasound unremarkable except for mild dilatation of the common bile duct. 2. Acute/subacute Tylenol overdose suspected. Tylenol level was therapeutic on admission, followup levels negative. Patient versus significant Tylenol use over the last several weeks. Continue Mucomyst. 3. Chronic generalized abdominal pain. She is managed this with Tylenol as an outpatient. She reports this is long-standing, per 24 years. CT of the abdomen and pelvis was unremarkable. Right upper quadrant ultrasound unremarkable. Urine pregnancy test negative. 4. Oral candidiasis. Etiology unclear. Nystatin. 5. History cholecystectomy, appendectomy 6. Chronic abdominal pain remained stable.   Based on elevated transaminases, poison control has recommended continuing Mucomyst. Repeat CMP, PT/INR in the morning. Total bilirubin, INR, albumin remains stable.  Follow-up GI recommendations.  HIV test pending.  Chronic abdominal pain (present for 4 years since C. difficile infection per patient) appears to be stable without complicating features. Imaging unremarkable. Exam remains benign.   Code Status: full code DVT prophylaxis: Lovenox Family Communication: Discussed with husband, daughter at bedside with patient's permission. Disposition Plan: Home when improved.  Murray Hodgkins, MD  [facility]  Pager [phone] If 7PM-7AM, please  contact night-coverage at [email or web address], password [facility] 01/16/2014, 10:11 AM  LOS: 3 days   Consultants:  GI  Procedures:    Antibiotics:    HPI/Subjective: Feels about the same. Tolerating diet. She continues to complain generalized abdominal pain.  Objective: Filed Vitals:   01/15/14 0530 01/15/14 1506 01/15/14 2019 01/16/14 0431  BP: 131/86 105/57 112/74 138/88  Pulse: 68 75 72 74  Temp: 98.4 F (36.9 C) 98.2 F (36.8 C) 98.3 F (36.8 C) 97.6 F (36.4 C)  TempSrc: Oral  Oral Oral  Resp: 16 18 18 18   Height:      Weight:      SpO2: 96% 98% 97% 97%    Intake/Output Summary (Last 24 hours) at 01/16/14 1011 Last data filed at 01/15/14 1700  Gross per 24 hour  Intake    440 ml  Output      0 ml  Net    440 ml     Filed Weights   01/13/14 2233 01/14/14 0257  Weight: 77.111 kg (170 lb) 83.3 kg (183 lb 10.3 oz)    Exam:   Afebrile, vital signs are stable. No hypoxia.  Gen. Appears calm, comfortable. Nontoxic.  Respiratory clear to auscultation bilaterally. No wheezes, rales or rhonchi. Normal respiratory effort.  Cardiovascular regular rate and rhythm. No murmur, rub or gallop. No lower extremity edema.  Abdomen soft, nontender, nondistended. No rebound or guarding. She is unable to localize the pain.  Psychiatric. Grossly normal mood and affect.  Data Reviewed:  Basic metabolic panel unremarkable. AST improved, 400, ALT increased, no free 6. INR normal, normal total bilirubin, alkaline phosphatase. Albumin are normal.   CT of the abdomen and pelvis no acute abnormality.   Ultrasound right upper quadrant no acute findings. Mild increase of caliber of common bile duct.   Scheduled Meds: . acetylcysteine  70 mg/kg Oral Q4H  .  enoxaparin (LOVENOX) injection  40 mg Subcutaneous Q24H  . nystatin  5 mL Oral QID   Continuous Infusions:   Principal Problem:   Transaminitis Active Problems:   Abdominal pain   Tylenol overdose   Oral  candidiasis   Time spent 20 minutes

## 2014-01-16 NOTE — Progress Notes (Signed)
[place] NOTE  Pharmacy Consult for Acetylcysteine PO Indication: excessive Tylenol use  Allergies  Allergen Reactions  . Bee Venom Anaphylaxis  . Ketorolac Tromethamine Anaphylaxis and Other (See Comments)    Seizure type spasms  . Imitrex [Sumatriptan Base] Other (See Comments)    Chest pain  . Isometheptene-Apap-Dichloral Other (See Comments)    Chest pain  . Tylenol With Codeine #3 [Acetaminophen-Codeine] Rash   Patient Measurements: Height: 4\' 11"  (149.9 cm) Weight: 183 lb 10.3 oz (83.3 kg) IBW/kg (Calculated) : 43.2  Vital Signs: Temp: 97.6 F (36.4 C) (04/16 0431) Temp src: Oral (04/16 0431) BP: 138/88 mmHg (04/16 0431) Pulse Rate: 74 (04/16 0431) Intake/Output from previous day: 04/15 0701 - 04/16 0700 In: 540 [P.O.:540] Out: -  Intake/Output from this shift:    Labs:  Recent Labs  01/13/14 2344 01/14/14 0447 01/14/14 1743 01/15/14 0542 01/16/14 0658  WBC 14.7* 9.3  --   --   --   HGB 14.5 13.4  --   --   --   HCT 42.7 39.1  --   --   --   PLT 312 270  --   --   --   CREATININE 0.65 0.57 0.68 0.80 0.66  ALBUMIN 3.7 3.2* 3.3* 3.1* 3.3*  PROT 7.6 6.9 6.9 6.6 7.4  AST 290* 725* 393* 659* 400*  ALT 270* 580* 524* 805* 946*  ALKPHOS 71 79 86 86 94  BILITOT 0.5 0.8 0.7 0.6 0.4   Estimated Creatinine Clearance: 90 ml/min (by C-G formula based on Cr of 0.66).    Microbiology: No results found for this or any previous visit (from the past 720 hour(s)).  Medical History: Past Medical History  Diagnosis Date  . Asthma   . Migraine   . Chronic abdominal pain   . Chronic back pain    Medications:  Scheduled:  . acetylcysteine  70 mg/kg Oral Q4H  . enoxaparin (LOVENOX) injection  40 mg Subcutaneous Q24H  . nystatin  5 mL Oral QID   Assessment: 38yo female who admits to using large amounts of Tylenol on a routine daily basis.  Pt has leukocytosis and transaminitis.  Asked to initiate Mucomyst Rx for Tylenol overdose.  Doses have  been charted as given and pt reportedly tolerating. 4/14 AM: Discussed with Poison Control.  Recommend to recheck coags, LFTs, and acetaminophen level tonight prior to 5th dose of Mucomyst and report back.  Labs will determine if OK to d/c Mucomyst after 6 total doses or continue Rx. 4/14 PM: - labs called to poison control.  Mucomyst stopped.  4/15 AM:  [facility] called.  LFTs rising off Mucomyst.  Recommended to restart at previous dose.  Monitor LFTs, protime, electrolytes, and Scr with 5th dose & q24h thereafter per Poison Control Protocol  4/16 AM:  [facility]  Pain still complaining of abdominal pain & AST continues to rise.  Continue acetylcysteine x 24hrs more.  Recheck LFTs & INR in am.  Plan:  Continue oral Mucomyst 70mg /kg q4h Check LFTs & PT/INR in am  F/U with poison control   Lavonia Drafts Vieno Tarrant 01/16/2014,10:47 AM

## 2014-01-17 DIAGNOSIS — K589 Irritable bowel syndrome without diarrhea: Secondary | ICD-10-CM

## 2014-01-17 DIAGNOSIS — R7989 Other specified abnormal findings of blood chemistry: Secondary | ICD-10-CM

## 2014-01-17 DIAGNOSIS — R740 Nonspecific elevation of levels of transaminase and lactic acid dehydrogenase [LDH]: Secondary | ICD-10-CM

## 2014-01-17 DIAGNOSIS — R7402 Elevation of levels of lactic acid dehydrogenase (LDH): Secondary | ICD-10-CM

## 2014-01-17 DIAGNOSIS — R7401 Elevation of levels of liver transaminase levels: Secondary | ICD-10-CM

## 2014-01-17 DIAGNOSIS — R109 Unspecified abdominal pain: Secondary | ICD-10-CM

## 2014-01-17 DIAGNOSIS — T391X1A Poisoning by 4-Aminophenol derivatives, accidental (unintentional), initial encounter: Secondary | ICD-10-CM

## 2014-01-17 LAB — COMPREHENSIVE METABOLIC PANEL
ALK PHOS: 85 U/L (ref 39–117)
ALT: 658 U/L — AB (ref 0–35)
AST: 196 U/L — ABNORMAL HIGH (ref 0–37)
Albumin: 3.3 g/dL — ABNORMAL LOW (ref 3.5–5.2)
BUN: 13 mg/dL (ref 6–23)
CO2: 21 meq/L (ref 19–32)
Calcium: 9.6 mg/dL (ref 8.4–10.5)
Chloride: 104 mEq/L (ref 96–112)
Creatinine, Ser: 0.7 mg/dL (ref 0.50–1.10)
GFR calc non Af Amer: >90 mL/min (ref >90)
GLUCOSE: 98 mg/dL (ref 70–99)
POTASSIUM: 4.4 meq/L (ref 3.7–5.3)
Sodium: 142 mEq/L (ref 137–147)
TOTAL PROTEIN: 7.2 g/dL (ref 6.0–8.3)
Total Bilirubin: 0.4 mg/dL (ref 0.3–1.2)

## 2014-01-17 LAB — PROTIME-INR
INR: 1.01 (ref 0.00–1.49)
Prothrombin Time: 13.1 seconds (ref 11.6–15.2)

## 2014-01-17 LAB — ANTI-SMOOTH MUSCLE ANTIBODY, IGG: F-ACTIN AB IGG: 4 U (ref <20)

## 2014-01-17 LAB — MITOCHONDRIAL ANTIBODIES: Mitochondrial M2 Ab, IgG: 0.58 (ref <0.91)

## 2014-01-17 LAB — ANA: Anti Nuclear Antibody(ANA): NEGATIVE

## 2014-01-17 MED ORDER — DICYCLOMINE HCL 10 MG PO CAPS: 10.0000 mg | ORAL_CAPSULE | Freq: Three times a day (TID) | ORAL | Status: DC

## 2014-01-17 MED ORDER — OXYCODONE HCL 10 MG PO TABS: 10.0000 mg | ORAL_TABLET | Freq: Four times a day (QID) | ORAL | Status: DC | PRN

## 2014-01-17 NOTE — Progress Notes (Signed)
Subjective; Patient has no complaints. She says her lower abdominal pain is better controlled with pain medication. She denies nausea or vomiting.  Objective; BP 131/76  Pulse 77  Temp(Src) 97.9 F (36.6 C) (Oral)  Resp 20  Ht 4\' 11"  (1.499 m)  Wt 183 lb 10.3 oz (83.3 kg)  BMI 37.07 kg/m2  SpO2 99%  LMP 12/30/2013 Patient is alert and in no acute distress. He is busy using her cell phone. Abdomen is full with normal bowel sounds. On palpation abdomen is soft and nontender. Liver edge is palpable below RCM. Spleen is nonpalpable.  Lab data; Sedimentation rate 27. ANA and SMA are pending. Bilirubin 0.4, AP 85, AST 196, ALT 658 and albumin 3.3 INR 1.01  Assessment; #1. Acute hepatocellular injury most likely secondary to acetaminophen. Transaminases are coming down. She's also been checked for autoimmune hepatitis lab studies are pending. If transaminases do not return to normal within the next few weeks she will need further workup including repeat ultrasound and possibly liver biopsy. #2. Chronic lower abdominal pain with features of irritable bowel syndrome.  Recommendations; Consider dicyclomine 10 mg by mouth a.c on discharge. Patient should use narcotics only on as-needed basis. Patient advised to keep Tylenol dose to 2 g or less per day. LFTs in 2 weeks. My office will remind patient. Office visit in 4 weeks.

## 2014-01-17 NOTE — Discharge Summary (Signed)
Physician Discharge Summary  Bonnie Jensen UMP:[id number] DOB: [birth year] DOA: 38/13/2015  PCP: No PCP Per Patient patient encouraged to establish with primary care physician of her choice. Has Medicaid.  Admit date: 01/13/2014 Discharge date: 01/17/2014  Recommendations for Outpatient Follow-up:  1. Resolution of acute transaminitis likely related to Tylenol overdose 2. Acute on chronic abdominal pain, IBS suspected as chronic component 3. We will followup with GI in 4 weeks, repeat laboratory studies in 2 weeks, coordinated by GI.  4. Patient instructed her to avoid Tylenol  Follow-up Information   Follow up with Rogene Houston, MD. (office will contact you for appointment)    Specialty:  Gastroenterology   Contact information:   [address] [place] [address] [phone]      Discharge Diagnoses:  1. Acute transaminitis secondary to Tylenol overdose 2. Accidental Tylenol overdose 3. Chronic generalized abdominal pain, IBS suspected  Discharge Condition: Improved Disposition: Home  Diet recommendation: Regular  Filed Weights   01/13/14 2233 01/14/14 0257  Weight: 77.111 kg (170 lb) 83.3 kg (183 lb 10.3 oz)    History of present illness:  38 year old woman presented with nausea, vomiting and lower, pain. Initial evaluation revealed elevated LFTs, history of Tylenol overuse over the last 3 weeks.  Hospital Course:  Patient was seen in consultation with gastroenterology, started empirically on Mucomyst in coordination with pharmacy and poison control. Transaminitis presumably secondary to Tylenol overuse by history. She required protracted course of Mucomyst, however transaminases now trending downwards, albumin, PT INR and bilirubin normal. Cleared by poison control and gastroenterology for discharge with no further testing recommended. Gastroneurology will followup in 4 weeks' time with repeat lab work in 2 weeks' time. GI has recommended Bentyl.  1. Acute  transaminitis. Resolving. Based on history highly suspicious for Tylenol injury. Hepatitis panel negative, HIV nonreactive. Reported fever prior to admission, none since admission. Imaging of the abdomen and pelvis by CT and right upper quadrant by ultrasound unremarkable except for mild dilatation of the common bile duct. 2. Acute/subacute Tylenol overdose. Tylenol level was therapeutic on admission, followup levels negative. Patient with significant Tylenol use over the last several weeks. Mucomyst discontinued. 3. Chronic generalized abdominal pain. She self-managed this with Tylenol as an outpatient. She reports this is long-standing, over 4 years. CT of the abdomen and pelvis was unremarkable. Right upper quadrant ultrasound unremarkable. Urine pregnancy test negative. 4. Oral candidiasis. Nystatin. HIV nonreactive. 5. History cholecystectomy, appendectomy 6. Chronic abdominal pain stable.  Consultants:  GI Procedures: none  Discharge Instructions  Discharge Orders   Future Orders Complete By Expires   Activity as tolerated - No restrictions  As directed    Diet general  As directed    Discharge instructions  As directed        Medication List    STOP taking these medications       ibuprofen 200 MG tablet  Commonly known as:  ADVIL,MOTRIN      TAKE these medications       dicyclomine 10 MG capsule  Commonly known as:  BENTYL  Take 1 capsule (10 mg total) by mouth 3 (three) times daily before meals.     Oxycodone HCl 10 MG Tabs  Take 1 tablet (10 mg total) by mouth every 6 (six) hours as needed for severe pain.       Allergies  Allergen Reactions  . Bee Venom Anaphylaxis  . Ketorolac Tromethamine Anaphylaxis and Other (See Comments)    Seizure type spasms  .  Imitrex [Sumatriptan Base] Other (See Comments)    Chest pain  . Isometheptene-Apap-Dichloral Other (See Comments)    Chest pain  . Tylenol With Codeine #3 [Acetaminophen-Codeine] Rash    The results of  significant diagnostics from this hospitalization (including imaging, microbiology, ancillary and laboratory) are listed below for reference.    Significant Diagnostic Studies: US Abdomen Complete  01/14/2014   CLINICAL DATA:  Transaminitis.  Abdominal pain.  EXAM: ULTRASOUND ABDOMEN COMPLETE  COMPARISON:  01/14/2014  FINDINGS: Gallbladder:  Previous cholecystectomy  Common bile duct:  Diameter: 8.2 mm.  Liver:  No focal lesion identified. Within normal limits in parenchymal echogenicity.  IVC:  No abnormality visualized.  Pancreas:  Visualized portion unremarkable.  Spleen:  Size and appearance within normal limits.  Right Kidney:  Length: 13.6 cm. Echogenicity within normal limits. No mass or hydronephrosis visualized.  Left Kidney:  Length: 10.9 cm. Echogenicity within normal limits. No mass or hydronephrosis visualized.  Abdominal aorta:  No aneurysm visualized.  Other findings:  None.  IMPRESSION: 1. No acute findings. 2. Prior cholecystectomy with mild increase caliber of the common bile duct.   Electronically Signed   By: Kerby Moors M.D.   On: 01/14/2014 11:30   Ct Abdomen Pelvis W Contrast  01/14/2014   CLINICAL DATA:  Low abdominal pain, cramping  EXAM: CT ABDOMEN AND PELVIS WITH CONTRAST  TECHNIQUE: Multidetector CT imaging of the abdomen and pelvis was performed using the standard protocol following bolus administration of intravenous contrast.  CONTRAST:  64mL OMNIPAQUE IOHEXOL 300 MG/ML SOLN, 157mL OMNIPAQUE IOHEXOL 300 MG/ML SOLN  COMPARISON:  None.  FINDINGS: There is a 4.6 mm stable left lower lobe pulmonary nodule unchanged compared with 06/20/2011.  The liver demonstrates no focal abnormality. There is no intrahepatic or extrahepatic biliary ductal dilatation. The gallbladder is surgically absent. The spleen demonstrates no focal abnormality. The kidneys, adrenal glands and pancreas are normal. The bladder is unremarkable.  The stomach, duodenum, small intestine, and large intestine  demonstrate no contrast extravasation or dilatation. There is no pneumoperitoneum, pneumatosis, or portal venous gas. There is no abdominal or pelvic free fluid. There is no lymphadenopathy. The ovaries and uterus are unremarkable.  The abdominal aorta is normal in caliber .  There are no lytic or sclerotic osseous lesions.  IMPRESSION: No acute abdominal or pelvic pathology.   Electronically Signed   By: Kathreen Devoid   On: 01/14/2014 00:57   Labs: Basic Metabolic Panel:  Recent Labs Lab 01/14/14 0447 01/14/14 1743 01/15/14 0542 01/16/14 0658 01/17/14 0538  NA 142 139 141 141 142  K 4.2 4.1 4.3 4.0 4.4  CL 105 102 107 104 104  CO2 23 24 22 21 21   GLUCOSE 101* 99 85 101* 98  BUN 7 10 9 11 13   CREATININE 0.57 0.68 0.80 0.66 0.70  CALCIUM 8.6 8.4 9.2 9.6 9.6   Liver Function Tests:  Recent Labs Lab 01/14/14 0447 01/14/14 1743 01/15/14 0542 01/16/14 0658 01/17/14 0538  AST 725* 393* 659* 400* 196*  ALT 580* 524* 805* 946* 658*  ALKPHOS 79 86 86 94 85  BILITOT 0.8 0.7 0.6 0.4 0.4  PROT 6.9 6.9 6.6 7.4 7.2  ALBUMIN 3.2* 3.3* 3.1* 3.3* 3.3*    Recent Labs Lab 01/13/14 2344  LIPASE 32   CBC:  Recent Labs Lab 01/13/14 2344 01/14/14 0447  WBC 14.7* 9.3  NEUTROABS 11.4*  --   HGB 14.5 13.4  HCT 42.7 39.1  MCV 96.0 96.1  PLT 312 270  CBG:  Recent Labs Lab 01/14/14 1121 01/14/14 1604 01/14/14 2032 01/15/14 0732 01/15/14 1144  GLUCAP 136* 88 86 74 71    Principal Problem:   Transaminitis Active Problems:   Abdominal pain   Tylenol overdose   Oral candidiasis   Time coordinating discharge: 35 minutes  Signed:  Murray Hodgkins, MD [facility] 01/17/2014, 1:02 PM

## 2014-01-17 NOTE — Progress Notes (Signed)
PROGRESS NOTE  Bonnie Jensen HGD:[id number] DOB: 05/03/1976 DOA: 01/13/2014 PCP: No PCP Per Patient  Summary: 38 year old woman presented with nausea, vomiting and lower, pain. Initial evaluation revealed elevated LFTs, history of Tylenol use over the last 3 weeks, normal Tylenol level. She started on Mucomyst.  Assessment/Plan: 1. Acute transaminitis. Resolving. Based on history highly suspicious for Tylenol injury. Hepatitis panel negative, HIV nonreactive. Reported fever prior to admission, none since admission. Imaging of the abdomen and pelvis by CT and right upper quadrant by ultrasound unremarkable except for mild dilatation of the common bile duct. 2. Acute/subacute Tylenol overdose. Tylenol level was therapeutic on admission, followup levels negative. Patient with significant Tylenol use over the last several weeks. Mucomyst discontinued. 3. Chronic generalized abdominal pain. She self-managed this with Tylenol as an outpatient. She reports this is long-standing, over 4 years. CT of the abdomen and pelvis was unremarkable. Right upper quadrant ultrasound unremarkable. Urine pregnancy test negative. 4. Oral candidiasis. Nystatin. HIV nonreactive. 5. History cholecystectomy, appendectomy 6. Chronic abdominal pain stable.   Transaminases improving. INR and albumin stable. Poison control has recommended stopping Mucomyst. No further laboratory studies.  Clinically the patient feels well.  Will discuss with GI, anticipate discharge later today.  Code Status: full code DVT prophylaxis: Lovenox Family Communication: Discussed with husband, daughter at bedside with patient's permission. Disposition Plan: Home when improved.  Murray Hodgkins, MD  [facility]  Pager [phone] If 7PM-7AM, please contact night-coverage at [email or web address], password [facility] 01/17/2014, 11:45 AM  LOS: 4 days   Consultants:  GI  Procedures:    Antibiotics:    HPI/Subjective: Feels much better.  Pain well-controlled, minimal. No n/v. Tolerating diet. Feels ready to go home.  Objective: Filed Vitals:   01/15/14 2019 01/16/14 0431 01/16/14 2035 01/17/14 0420  BP: 112/74 138/88 126/80 131/76  Pulse: 72 74 86 77  Temp: 98.3 F (36.8 C) 97.6 F (36.4 C) 98.1 F (36.7 C) 97.9 F (36.6 C)  TempSrc: Oral Oral Oral Oral  Resp: 18 18 20 20   Height:      Weight:      SpO2: 97% 97% 98% 99%    Intake/Output Summary (Last 24 hours) at 01/17/14 1145 Last data filed at 01/17/14 0916  Gross per 24 hour  Intake    240 ml  Output      0 ml  Net    240 ml     Filed Weights   01/13/14 2233 01/14/14 0257  Weight: 77.111 kg (170 lb) 83.3 kg (183 lb 10.3 oz)    Exam:   Afebrile, vital signs are stable. No hypoxia.  Gen. Appears calm, comfortable. Sitting up in bed appears well.  Respiratory clear to auscultation bilaterally. No wheezes, rales or rhonchi. Normal respiratory effort.  Cardiovascular regular rate and rhythm. No murmur, rub or gallop.  Abdomen soft, nontender, nondistended. No rebound or guarding. Benign examination.  Data Reviewed:  Basic metabolic panel unremarkable. AST and ALT trending downwards. INR normal, normal total bilirubin, alkaline phosphatase. Albumin without change.   CT of the abdomen and pelvis no acute abnormality.   Ultrasound right upper quadrant no acute findings. Mild increase of caliber of common bile duct.   Scheduled Meds: . enoxaparin (LOVENOX) injection  40 mg Subcutaneous Q24H  . nystatin  5 mL Oral QID   Continuous Infusions:   Principal Problem:   Transaminitis Active Problems:   Abdominal pain   Tylenol overdose   Oral candidiasis

## 2014-01-17 NOTE — Progress Notes (Signed)
[place] NOTE  Pharmacy Consult for Acetylcysteine PO Indication: excessive Tylenol use  Allergies  Allergen Reactions  . Bee Venom Anaphylaxis  . Ketorolac Tromethamine Anaphylaxis and Other (See Comments)    Seizure type spasms  . Imitrex [Sumatriptan Base] Other (See Comments)    Chest pain  . Isometheptene-Apap-Dichloral Other (See Comments)    Chest pain  . Tylenol With Codeine #3 [Acetaminophen-Codeine] Rash   Patient Measurements: Height: 4\' 11"  (149.9 cm) Weight: 183 lb 10.3 oz (83.3 kg) IBW/kg (Calculated) : 43.2  Vital Signs: Temp: 97.9 F (36.6 C) (04/17 0420) Temp src: Oral (04/17 0420) BP: 131/76 mmHg (04/17 0420) Pulse Rate: 77 (04/17 0420) Intake/Output from previous day:   Intake/Output from this shift: Total I/O In: 240 [P.O.:240] Out: -   Labs:  Recent Labs  01/15/14 0542 01/16/14 0658 01/17/14 0538  CREATININE 0.80 0.66 0.70  ALBUMIN 3.1* 3.3* 3.3*  PROT 6.6 7.4 7.2  AST 659* 400* 196*  ALT 805* 946* 658*  ALKPHOS 86 94 85  BILITOT 0.6 0.4 0.4   Estimated Creatinine Clearance: 90 ml/min (by C-G formula based on Cr of 0.7).    Microbiology: No results found for this or any previous visit (from the past 720 hour(s)).  Medical History: Past Medical History  Diagnosis Date  . Asthma   . Migraine   . Chronic abdominal pain   . Chronic back pain    Medications:  Scheduled:  . enoxaparin (LOVENOX) injection  40 mg Subcutaneous Q24H  . nystatin  5 mL Oral QID   Assessment: 38yo female who admits to using large amounts of Tylenol on a routine daily basis.  Pt has leukocytosis and transaminitis.  Asked to initiate Mucomyst Rx for Tylenol overdose.  Doses have been charted as given and pt reportedly tolerating. 4/14 AM: Discussed with Poison Control.  Recommend to recheck coags, LFTs, and acetaminophen level tonight prior to 5th dose of Mucomyst and report back.  Labs will determine if OK to d/c Mucomyst after 6 total  doses or continue Rx. 4/14 PM: - labs called to poison control.  Mucomyst stopped.  4/15 AM:  [facility] called.  LFTs rising off Mucomyst.  Recommended to restart at previous dose.  Monitor LFTs, protime, electrolytes, and Scr with 5th dose & q24h thereafter per Poison Control Protocol  4/16 AM:  [facility].  Pain still complaining of abdominal pain & AST continues to rise.  Continue acetylcysteine x 24hrs more.  Recheck LFTs & INR in am. 4/17 AM:  Spoke with [facility].  LFTs and symptoms improved.  Recommend stopping Mucomyst at this time.  No further labs needed per their protocol.   Plan:  D/C Mucomyst  Current case closed with Poison Control, however may follow-up/call if needed. Pharmacy to sign off.  Please re-consult if needed.   Bonnie Jensen Bonnie Jensen 01/17/2014,10:19 AM

## 2014-01-17 NOTE — Discharge Planning (Signed)
Pt stated she was ready to go home and pain was under control.  Pt's IV removed and pt given DC papers and education with family in room.  Pt educated about max tylenol use and s/sx that may cause a call to doctor or to return to the hospital.  Pt given scripts and told of FU appointment needed.  Pt will be wheeled to car by PCT and family when ready.

## 2014-01-21 ENCOUNTER — Telehealth (INDEPENDENT_AMBULATORY_CARE_PROVIDER_SITE_OTHER): Payer: Self-pay | Admitting: *Deleted

## 2014-01-21 DIAGNOSIS — K589 Irritable bowel syndrome without diarrhea: Secondary | ICD-10-CM

## 2014-01-21 DIAGNOSIS — R7401 Elevation of levels of liver transaminase levels: Secondary | ICD-10-CM

## 2014-01-21 DIAGNOSIS — R740 Nonspecific elevation of levels of transaminase and lactic acid dehydrogenase [LDH]: Secondary | ICD-10-CM

## 2014-01-21 DIAGNOSIS — R109 Unspecified abdominal pain: Secondary | ICD-10-CM

## 2014-01-21 NOTE — Telephone Encounter (Signed)
Per Dr.Rehman the patient will need to have labs drawn in 2 weeks. Lab is noted for 02-04-14. Patient will also need a office visit in 4 weeks.

## 2014-01-24 NOTE — Telephone Encounter (Signed)
Apt has been scheduled for 02/18/14 with Deberah Castle, NP.

## 2014-02-04 ENCOUNTER — Other Ambulatory Visit (INDEPENDENT_AMBULATORY_CARE_PROVIDER_SITE_OTHER): Payer: Self-pay | Admitting: Internal Medicine

## 2014-02-04 LAB — HEPATIC FUNCTION PANEL
ALBUMIN: 4 g/dL (ref 3.5–5.2)
ALT: 35 U/L (ref 0–35)
AST: 59 U/L — AB (ref 0–37)
Alkaline Phosphatase: 79 U/L (ref 39–117)
Bilirubin, Direct: 0.1 mg/dL (ref 0.0–0.3)
Indirect Bilirubin: 0.3 mg/dL (ref 0.2–1.2)
Total Bilirubin: 0.4 mg/dL (ref 0.2–1.2)
Total Protein: 6.8 g/dL (ref 6.0–8.3)

## 2014-02-11 ENCOUNTER — Emergency Department (HOSPITAL_COMMUNITY)
Admission: EM | Admit: 2014-02-11 | Discharge: 2014-02-12 | Disposition: A | Discharge status: Home / Self Care | Payer: Medicaid Other | Attending: Emergency Medicine | Admitting: Emergency Medicine

## 2014-02-11 ENCOUNTER — Encounter (HOSPITAL_COMMUNITY): Payer: Self-pay | Admitting: Emergency Medicine

## 2014-02-11 DIAGNOSIS — Z9089 Acquired absence of other organs: Secondary | ICD-10-CM | POA: Insufficient documentation

## 2014-02-11 DIAGNOSIS — Z8679 Personal history of other diseases of the circulatory system: Secondary | ICD-10-CM | POA: Insufficient documentation

## 2014-02-11 DIAGNOSIS — J45909 Unspecified asthma, uncomplicated: Secondary | ICD-10-CM | POA: Insufficient documentation

## 2014-02-11 DIAGNOSIS — Z9851 Tubal ligation status: Secondary | ICD-10-CM | POA: Insufficient documentation

## 2014-02-11 DIAGNOSIS — R112 Nausea with vomiting, unspecified: Secondary | ICD-10-CM | POA: Insufficient documentation

## 2014-02-11 DIAGNOSIS — M545 Low back pain, unspecified: Secondary | ICD-10-CM | POA: Insufficient documentation

## 2014-02-11 DIAGNOSIS — G8929 Other chronic pain: Secondary | ICD-10-CM | POA: Insufficient documentation

## 2014-02-11 DIAGNOSIS — R109 Unspecified abdominal pain: Secondary | ICD-10-CM | POA: Insufficient documentation

## 2014-02-11 DIAGNOSIS — F17200 Nicotine dependence, unspecified, uncomplicated: Secondary | ICD-10-CM | POA: Insufficient documentation

## 2014-02-11 LAB — CBC WITH DIFFERENTIAL/PLATELET
Basophils Absolute: 0.1 10*3/uL (ref 0.0–0.1)
Basophils Relative: 1 % (ref 0–1)
EOS PCT: 3 % (ref 0–5)
Eosinophils Absolute: 0.3 10*3/uL (ref 0.0–0.7)
HCT: 39.7 % (ref 36.0–46.0)
Hemoglobin: 13.5 g/dL (ref 12.0–15.0)
LYMPHS ABS: 2.4 10*3/uL (ref 0.7–4.0)
LYMPHS PCT: 27 % (ref 12–46)
MCH: 32.6 pg (ref 26.0–34.0)
MCHC: 34 g/dL (ref 30.0–36.0)
MCV: 95.9 fL (ref 78.0–100.0)
Monocytes Absolute: 0.5 10*3/uL (ref 0.1–1.0)
Monocytes Relative: 6 % (ref 3–12)
NEUTROS ABS: 5.6 10*3/uL (ref 1.7–7.7)
Neutrophils Relative %: 63 % (ref 43–77)
PLATELETS: 278 10*3/uL (ref 150–400)
RBC: 4.14 MIL/uL (ref 3.87–5.11)
RDW: 14.1 % (ref 11.5–15.5)
WBC: 8.9 10*3/uL (ref 4.0–10.5)

## 2014-02-11 LAB — COMPREHENSIVE METABOLIC PANEL
ALT: 21 U/L (ref 0–35)
AST: 21 U/L (ref 0–37)
Albumin: 3.9 g/dL (ref 3.5–5.2)
Alkaline Phosphatase: 80 U/L (ref 39–117)
BILIRUBIN TOTAL: 0.3 mg/dL (ref 0.3–1.2)
BUN: 16 mg/dL (ref 6–23)
CHLORIDE: 99 meq/L (ref 96–112)
CO2: 26 meq/L (ref 19–32)
Calcium: 9.9 mg/dL (ref 8.4–10.5)
Creatinine, Ser: 0.77 mg/dL (ref 0.50–1.10)
GLUCOSE: 87 mg/dL (ref 70–99)
POTASSIUM: 4 meq/L (ref 3.7–5.3)
SODIUM: 137 meq/L (ref 137–147)
Total Protein: 7.5 g/dL (ref 6.0–8.3)

## 2014-02-11 MED ORDER — SODIUM CHLORIDE 0.9 % IV BOLUS (SEPSIS)
1000.0000 mL | Freq: Once | INTRAVENOUS | Status: AC
  Administered 2014-02-12: 1000 mL via INTRAVENOUS

## 2014-02-11 MED ORDER — FENTANYL CITRATE 0.05 MG/ML IJ SOLN
50.0000 ug | INTRAMUSCULAR | Status: DC | PRN
  Administered 2014-02-12: 50 ug via INTRAVENOUS
  Filled 2014-02-11: qty 2

## 2014-02-11 MED ORDER — DICYCLOMINE HCL 10 MG/ML IM SOLN
20.0000 mg | Freq: Once | INTRAMUSCULAR | Status: AC
  Administered 2014-02-11: 20 mg via INTRAMUSCULAR
  Filled 2014-02-11: qty 2

## 2014-02-11 MED ORDER — METOCLOPRAMIDE HCL 5 MG/ML IJ SOLN
10.0000 mg | Freq: Once | INTRAMUSCULAR | Status: AC
  Administered 2014-02-11: 10 mg via INTRAVENOUS
  Filled 2014-02-11: qty 2

## 2014-02-11 MED ORDER — DIPHENHYDRAMINE HCL 50 MG/ML IJ SOLN
25.0000 mg | Freq: Once | INTRAMUSCULAR | Status: AC
  Administered 2014-02-11: 25 mg via INTRAVENOUS
  Filled 2014-02-11: qty 1

## 2014-02-11 MED ORDER — SODIUM CHLORIDE 0.9 % IV SOLN
1000.0000 mL | Freq: Once | INTRAVENOUS | Status: AC
  Administered 2014-02-11: 1000 mL via INTRAVENOUS

## 2014-02-11 MED ORDER — SODIUM CHLORIDE 0.9 % IV SOLN
1000.0000 mL | INTRAVENOUS | Status: DC
  Administered 2014-02-11: 1000 mL via INTRAVENOUS

## 2014-02-11 NOTE — ED Notes (Signed)
Pt c/o lower abdominal pain that radiates to lower back. Pt also reports n/v, denies diarrhea. Pt denies changes in urinary symptoms.

## 2014-02-11 NOTE — ED Provider Notes (Signed)
CSN: [id number]     Arrival date & time 02/11/14  1801 History   First MD Initiated Contact with Patient 02/11/14 2048  This chart was scribed for Bonnie Norrie, MD by Anastasia Pall, ED Scribe. This patient was seen in room APA01/APA01 and the patient's care was started at 9:34 PM.    Chief Complaint  Patient presents with  . Abdominal Pain   (Consider location/radiation/quality/duration/timing/severity/associated sxs/prior Treatment) The history is provided by the patient. No language interpreter was used.   HPI Comments: Bonnie Jensen is a 38 y.o. female with h/o chronic abdominal pain, who presents to the Emergency Department complaining of stabbing, bilateral, lower abdominal pain, that radiates to her back, onset 1 week ago, with associated nausea and vomiting onset 2 days ago. She reports vomiting about 5 times today and over 5 times yesterday. She reports that movement exacerbates her pain. She denies anything relieving her pain. She denies having been seen for her symptoms within the last week. She states her recent pain has been consistent with her h/o prior abdominal pain, and states she has been seen here before for the same.  She reports being followed by Dr. Laural Golden. She reports they told her her liver enzyme levels were too high from possible Tylenol overdose, needed to have testing done on her liver. She states her next appointment is 05/19. She reports h/o appendectomy, cholecystectomy, tubal ligation. She denies having gynecologist. She denies being sexually active, last active 3 years ago. Her LNMP was 04/25.  She denies diarrhea, constipation, fever, dysuria, frequency, vaginal discharge, and any other associated symptoms. She reports smoking 0.5 PPD. She denies EtOH use. She reports being recently laid off. She was a Public affairs consultant.   PCP - No PCP Per Patient GI Dr Laural Golden  Past Medical History  Diagnosis Date  . Asthma   . Migraine   . Chronic abdominal pain   . Chronic back  pain    Past Surgical History  Procedure Laterality Date  . Appendectomy    . Cholecystectomy    . Tonsillectomy    . Tubal ligation    . Brain surgery     History reviewed. No pertinent family history. History  Substance Use Topics  . Smoking status: Current Every Day Smoker -- 0.50 packs/day for 15 years    Types: Cigarettes  . Smokeless tobacco: Never Used  . Alcohol Use: No   Unemployed  OB History   Grav Para Term Preterm Abortions TAB SAB Ect Mult Living   1 1 1       1      Review of Systems  Constitutional: Negative for fever.  Gastrointestinal: Positive for nausea, vomiting and abdominal pain (lower). Negative for diarrhea.  Genitourinary: Negative for dysuria and frequency.  Musculoskeletal: Positive for back pain (lower).   Allergies  Bee venom; Ketorolac tromethamine; Imitrex; Isometheptene-apap-dichloral; and Tylenol with codeine #3  Home Medications   Prior to Admission medications   Medication Sig Start Date End Date Taking? Authorizing Provider  dicyclomine (BENTYL) 10 MG capsule Take 1 capsule (10 mg total) by mouth 3 (three) times daily before meals. 01/17/14   Samuella Cota, MD  oxyCODONE 10 MG TABS Take 1 tablet (10 mg total) by mouth every 6 (six) hours as needed for severe pain. 01/17/14   Samuella Cota, MD   BP 156/96  Pulse 111  Temp(Src) 97.6 F (36.4 C) (Oral)  Resp 16  Ht 4\' 11"  (1.499 m)  Wt 175 lb (  79.379 kg)  BMI 35.33 kg/m2  SpO2 99%  LMP 01/25/2014  Vital signs normal except for tachycardia   Physical Exam  Nursing note and vitals reviewed. Constitutional: She is oriented to person, place, and time. She appears well-developed and well-nourished.  Non-toxic appearance. She does not appear ill. No distress.  HENT:  Head: Normocephalic and atraumatic.  Right Ear: External ear normal.  Left Ear: External ear normal.  Nose: Nose normal. No mucosal edema or rhinorrhea.  Mouth/Throat: Oropharynx is clear and moist and mucous  membranes are normal. No dental abscesses or uvula swelling.  Eyes: Conjunctivae and EOM are normal. Pupils are equal, round, and reactive to light.  Neck: Normal range of motion and full passive range of motion without pain. Neck supple.  Cardiovascular: Normal rate.   Pulmonary/Chest: Effort normal and breath sounds normal. No respiratory distress. She has no rhonchi. She exhibits no crepitus.  Abdominal: Soft. Normal appearance. She exhibits no distension. Bowel sounds are increased. There is tenderness. There is no rebound and no guarding.  Mild diffuse abdominal pain. Her pain without palpation is lower abdomen bilaterally.   Musculoskeletal: Normal range of motion. She exhibits no edema and no tenderness.  Moves all extremities well.   Neurological: She is alert and oriented to person, place, and time. She has normal strength. No cranial nerve deficit.  Skin: Skin is warm, dry and intact. No rash noted. No erythema. No pallor.  Psychiatric: She has a normal mood and affect. Her speech is normal and behavior is normal. Her mood appears not anxious.    ED Course  Procedures (including critical care time)  Medications  0.9 %  sodium chloride infusion (0 mLs Intravenous Stopped 02/11/14 2305)    Followed by  0.9 %  sodium chloride infusion (1,000 mLs Intravenous New Bag/Given 02/11/14 2157)  fentaNYL (SUBLIMAZE) injection 50 mcg (50 mcg Intravenous Given 02/12/14 0012)  metoCLOPramide (REGLAN) injection 10 mg (10 mg Intravenous Given 02/11/14 2200)  diphenhydrAMINE (BENADRYL) injection 25 mg (25 mg Intravenous Given 02/11/14 2157)  dicyclomine (BENTYL) injection 20 mg (20 mg Intramuscular Given 02/11/14 2203)  sodium chloride 0.9 % bolus 1,000 mL (1,000 mLs Intravenous New Bag/Given 02/12/14 0011)     DIAGNOSTIC STUDIES: Oxygen Saturation is 99% on room air, normal by my interpretation.    COORDINATION OF CARE: 9:41 PM-Discussed treatment plan which includes IV fluids, nausea medication,  UA, and blood work with pt at bedside and pt agreed to plan.   Patient has had frequent ED visits mainly for the dental pain and back pain and most recently abdominal pain. She was admitted to the hospital on April 13 for possible Tylenol overdose with elevated LFTs. Her LFTs today however are back to normal. She had a complete abdominal ultrasound done on 4/14 that was normal. She also had a normal CT scan of her abdomen/pelvis on 4/13 that was totally normal. These tests were not repeated tonight. Of note her daughter is also in the ED tonight for abdominal pain.    Results for orders placed during the hospital encounter of 02/11/14  URINE RAPID DRUG SCREEN (HOSP PERFORMED)      Result Value Ref Range   Opiates POSITIVE (*) NONE DETECTED   Cocaine NONE DETECTED  NONE DETECTED   Benzodiazepines NONE DETECTED  NONE DETECTED   Amphetamines NONE DETECTED  NONE DETECTED   Tetrahydrocannabinol NONE DETECTED  NONE DETECTED   Barbiturates NONE DETECTED  NONE DETECTED  URINALYSIS, ROUTINE W REFLEX MICROSCOPIC  Result Value Ref Range   Color, Urine STRAW (*) YELLOW   APPearance CLEAR  CLEAR   Specific Gravity, Urine <1.005 (*) 1.005 - 1.030   pH 5.5  5.0 - 8.0   Glucose, UA NEGATIVE  NEGATIVE mg/dL   Hgb urine dipstick LARGE (*) NEGATIVE   Bilirubin Urine NEGATIVE  NEGATIVE   Ketones, ur NEGATIVE  NEGATIVE mg/dL   Protein, ur NEGATIVE  NEGATIVE mg/dL   Urobilinogen, UA 0.2  0.0 - 1.0 mg/dL   Nitrite NEGATIVE  NEGATIVE   Leukocytes, UA NEGATIVE  NEGATIVE  CBC WITH DIFFERENTIAL      Result Value Ref Range   WBC 8.9  4.0 - 10.5 K/uL   RBC 4.14  3.87 - 5.11 MIL/uL   Hemoglobin 13.5  12.0 - 15.0 g/dL   HCT 39.7  36.0 - 46.0 %   MCV 95.9  78.0 - 100.0 fL   MCH 32.6  26.0 - 34.0 pg   MCHC 34.0  30.0 - 36.0 g/dL   RDW 14.1  11.5 - 15.5 %   Platelets 278  150 - 400 K/uL   Neutrophils Relative % 63  43 - 77 %   Neutro Abs 5.6  1.7 - 7.7 K/uL   Lymphocytes Relative 27  12 - 46 %   Lymphs  Abs 2.4  0.7 - 4.0 K/uL   Monocytes Relative 6  3 - 12 %   Monocytes Absolute 0.5  0.1 - 1.0 K/uL   Eosinophils Relative 3  0 - 5 %   Eosinophils Absolute 0.3  0.0 - 0.7 K/uL   Basophils Relative 1  0 - 1 %   Basophils Absolute 0.1  0.0 - 0.1 K/uL  COMPREHENSIVE METABOLIC PANEL      Result Value Ref Range   Sodium 137  137 - 147 mEq/L   Potassium 4.0  3.7 - 5.3 mEq/L   Chloride 99  96 - 112 mEq/L   CO2 26  19 - 32 mEq/L   Glucose, Bld 87  70 - 99 mg/dL   BUN 16  6 - 23 mg/dL   Creatinine, Ser 0.77  0.50 - 1.10 mg/dL   Calcium 9.9  8.4 - 10.5 mg/dL   Total Protein 7.5  6.0 - 8.3 g/dL   Albumin 3.9  3.5 - 5.2 g/dL   AST 21  0 - 37 U/L   ALT 21  0 - 35 U/L   Alkaline Phosphatase 80  39 - 117 U/L   Total Bilirubin 0.3  0.3 - 1.2 mg/dL   GFR calc non Af Amer >90  >90 mL/min   GFR calc Af Amer >90  >90 mL/min  URINE MICROSCOPIC-ADD ON      Result Value Ref Range   Squamous Epithelial / LPF FEW (*) RARE   WBC, UA 0-2  <3 WBC/hpf   RBC / HPF TOO NUMEROUS TO COUNT  <3 RBC/hpf   Bacteria, UA RARE  RARE   Laboratory interpretation all normal except hematuria    EKG Interpretation None      MDM   Final diagnoses:  Chronic abdominal pain    New Prescriptions   ONDANSETRON (ZOFRAN) 4 MG TABLET    Take 1 tablet (4 mg total) by mouth every 8 (eight) hours as needed for nausea or vomiting.    Plan discharge  Rolland Porter, MD, FACEP    I personally performed the services described in this documentation, which was scribed in my presence. The recorded information has  been reviewed and considered.  Rolland Porter, MD, FACEP    Bonnie Norrie, MD 02/12/14 [id number]

## 2014-02-12 LAB — URINALYSIS, ROUTINE W REFLEX MICROSCOPIC
Bilirubin Urine: NEGATIVE
GLUCOSE, UA: NEGATIVE mg/dL
Ketones, ur: NEGATIVE mg/dL
LEUKOCYTES UA: NEGATIVE
Nitrite: NEGATIVE
Protein, ur: NEGATIVE mg/dL
Specific Gravity, Urine: <1.005 — ABNORMAL LOW (ref 1.005–1.030)
Urobilinogen, UA: 0.2 mg/dL (ref 0.0–1.0)
pH: 5.5 (ref 5.0–8.0)

## 2014-02-12 LAB — URINE MICROSCOPIC-ADD ON

## 2014-02-12 LAB — RAPID URINE DRUG SCREEN, HOSP PERFORMED
Amphetamines: NOT DETECTED
Barbiturates: NOT DETECTED
Benzodiazepines: NOT DETECTED
Cocaine: NOT DETECTED
OPIATES: POSITIVE — AB
TETRAHYDROCANNABINOL: NOT DETECTED

## 2014-02-12 MED ORDER — ONDANSETRON HCL 4 MG PO TABS: 4.0000 mg | ORAL_TABLET | Freq: Three times a day (TID) | ORAL | Status: DC | PRN

## 2014-02-12 NOTE — Discharge Instructions (Signed)
Drink plenty of fluids. You can take ibuprofen or aleve for pain. Use the zofran for nausea or vomiting. Keep your appointment with your gastroenterologist next week.    Chronic Pain Discharge Instructions  Emergency care providers appreciate that many patients coming to [place] are in severe pain and we wish to address their pain in the safest, most responsible manner.  It is important to recognize however, that the proper treatment of chronic pain differs from that of the pain of injuries and acute illnesses.  Our goal is to provide quality, safe, personalized care and we thank you for giving [place] the opportunity to serve you. The use of narcotics and related agents for chronic pain syndromes may lead to additional physical and psychological problems.  Nearly as many people die from prescription narcotics each year as die from car crashes.  Additionally, this risk is increased if such prescriptions are obtained from a variety of sources.  Therefore, only your primary care physician or a pain management specialist is able to safely treat such syndromes with narcotic medications long-term.    Documentation revealing such prescriptions have been sought from multiple sources may prohibit [place] from providing a refill or different narcotic medication.  Your name may be checked first through the [place].  This database is a record of controlled substance medication prescriptions that the patient has received.  This has been established by [facility] in an effort to eliminate the dangerous, and often life threatening, practice of obtaining multiple prescriptions from different medical providers.   If you have a chronic pain syndrome (i.e. chronic headaches, recurrent back or neck pain, dental pain, abdominal or pelvis pain without a specific diagnosis, or neuropathic pain such as fibromyalgia) or recurrent visits for the same condition without an acute diagnosis, you may  be treated with non-narcotics and other non-addictive medicines.  Allergic reactions or negative side effects that may be reported by a patient to such medications will not typically lead to the use of a narcotic analgesic or other controlled substance as an alternative.   Patients managing chronic pain with a personal physician should have provisions in place for breakthrough pain.  If you are in crisis, you should call your physician.  If your physician directs you to the emergency department, please have the doctor call and speak to our attending physician concerning your care.   When patients come to the Emergency Department (ED) with acute medical conditions in which the Emergency Department physician feels appropriate to prescribe narcotic or sedating pain medication, the physician will prescribe these in very limited quantities.  The amount of these medications will last only until you can see your primary care physician in his/her office.  Any patient who returns to the ED seeking refills should expect only non-narcotic pain medications.   In the event of an acute medical condition exists and the emergency physician feels it is necessary that the patient be given a narcotic or sedating medication -  a responsible adult driver should be present in the room prior to the medication being given by the nurse.   Prescriptions for narcotic or sedating medications that have been lost, stolen or expired will not be refilled in the Emergency Department.    Patients who have chronic pain may receive non-narcotic prescriptions until seen by their primary care physician.  It is every patients personal responsibility to maintain active prescriptions with his or her primary care physician or specialist.

## 2014-02-18 ENCOUNTER — Ambulatory Visit (INDEPENDENT_AMBULATORY_CARE_PROVIDER_SITE_OTHER): Payer: Medicaid Other | Admitting: Internal Medicine

## 2014-07-18 ENCOUNTER — Emergency Department (HOSPITAL_COMMUNITY): Payer: Medicaid Other

## 2014-07-18 ENCOUNTER — Emergency Department (HOSPITAL_COMMUNITY)
Admission: EM | Admit: 2014-07-18 | Discharge: 2014-07-18 | Disposition: A | Discharge status: Home / Self Care | Payer: Medicaid Other | Attending: Emergency Medicine | Admitting: Emergency Medicine

## 2014-07-18 ENCOUNTER — Encounter (HOSPITAL_COMMUNITY): Payer: Self-pay | Admitting: Emergency Medicine

## 2014-07-18 DIAGNOSIS — J45909 Unspecified asthma, uncomplicated: Secondary | ICD-10-CM | POA: Insufficient documentation

## 2014-07-18 DIAGNOSIS — Z9089 Acquired absence of other organs: Secondary | ICD-10-CM | POA: Insufficient documentation

## 2014-07-18 DIAGNOSIS — Z720 Tobacco use: Secondary | ICD-10-CM | POA: Insufficient documentation

## 2014-07-18 DIAGNOSIS — G8929 Other chronic pain: Secondary | ICD-10-CM | POA: Diagnosis not present

## 2014-07-18 DIAGNOSIS — Z8679 Personal history of other diseases of the circulatory system: Secondary | ICD-10-CM | POA: Diagnosis not present

## 2014-07-18 DIAGNOSIS — R109 Unspecified abdominal pain: Secondary | ICD-10-CM | POA: Diagnosis present

## 2014-07-18 DIAGNOSIS — Z9851 Tubal ligation status: Secondary | ICD-10-CM | POA: Diagnosis not present

## 2014-07-18 LAB — COMPREHENSIVE METABOLIC PANEL
ALK PHOS: 108 U/L (ref 39–117)
ALT: 28 U/L (ref 0–35)
ANION GAP: 14 (ref 5–15)
AST: 20 U/L (ref 0–37)
Albumin: 4.2 g/dL (ref 3.5–5.2)
BILIRUBIN TOTAL: 0.4 mg/dL (ref 0.3–1.2)
BUN: 9 mg/dL (ref 6–23)
CHLORIDE: 99 meq/L (ref 96–112)
CO2: 27 mEq/L (ref 19–32)
Calcium: 9.6 mg/dL (ref 8.4–10.5)
Creatinine, Ser: 0.78 mg/dL (ref 0.50–1.10)
GFR calc Af Amer: >90 mL/min (ref >90)
GFR calc non Af Amer: >90 mL/min (ref >90)
GLUCOSE: 84 mg/dL (ref 70–99)
Potassium: 3.8 mEq/L (ref 3.7–5.3)
Sodium: 140 mEq/L (ref 137–147)
TOTAL PROTEIN: 8.1 g/dL (ref 6.0–8.3)

## 2014-07-18 LAB — URINALYSIS, ROUTINE W REFLEX MICROSCOPIC
BILIRUBIN URINE: NEGATIVE
Glucose, UA: NEGATIVE mg/dL
KETONES UR: NEGATIVE mg/dL
Leukocytes, UA: NEGATIVE
Nitrite: NEGATIVE
Protein, ur: NEGATIVE mg/dL
Specific Gravity, Urine: 1.025 (ref 1.005–1.030)
UROBILINOGEN UA: 0.2 mg/dL (ref 0.0–1.0)
pH: 5.5 (ref 5.0–8.0)

## 2014-07-18 LAB — CBC WITH DIFFERENTIAL/PLATELET
BASOS PCT: 0 % (ref 0–1)
Basophils Absolute: 0 10*3/uL (ref 0.0–0.1)
EOS ABS: 0.2 10*3/uL (ref 0.0–0.7)
Eosinophils Relative: 2 % (ref 0–5)
HEMATOCRIT: 44.1 % (ref 36.0–46.0)
Hemoglobin: 15 g/dL (ref 12.0–15.0)
Lymphocytes Relative: 31 % (ref 12–46)
Lymphs Abs: 3.7 10*3/uL (ref 0.7–4.0)
MCH: 33 pg (ref 26.0–34.0)
MCHC: 34 g/dL (ref 30.0–36.0)
MCV: 97.1 fL (ref 78.0–100.0)
MONOS PCT: 5 % (ref 3–12)
Monocytes Absolute: 0.6 10*3/uL (ref 0.1–1.0)
Neutro Abs: 7.3 10*3/uL (ref 1.7–7.7)
Neutrophils Relative %: 62 % (ref 43–77)
Platelets: 362 10*3/uL (ref 150–400)
RBC: 4.54 MIL/uL (ref 3.87–5.11)
RDW: 13.7 % (ref 11.5–15.5)
WBC: 11.9 10*3/uL — ABNORMAL HIGH (ref 4.0–10.5)

## 2014-07-18 LAB — LIPASE, BLOOD: Lipase: 36 U/L (ref 11–59)

## 2014-07-18 LAB — URINE MICROSCOPIC-ADD ON

## 2014-07-18 MED ORDER — IOHEXOL 300 MG/ML  SOLN
100.0000 mL | Freq: Once | INTRAMUSCULAR | Status: AC | PRN
  Administered 2014-07-18: 100 mL via INTRAVENOUS

## 2014-07-18 MED ORDER — SODIUM CHLORIDE 0.9 % IV BOLUS (SEPSIS)
1000.0000 mL | Freq: Once | INTRAVENOUS | Status: AC
  Administered 2014-07-18: 1000 mL via INTRAVENOUS

## 2014-07-18 MED ORDER — PROMETHAZINE HCL 25 MG PO TABS: 25.0000 mg | ORAL_TABLET | Freq: Four times a day (QID) | ORAL | Status: DC | PRN

## 2014-07-18 MED ORDER — MORPHINE SULFATE 4 MG/ML IJ SOLN
4.0000 mg | Freq: Once | INTRAMUSCULAR | Status: AC
  Administered 2014-07-18: 4 mg via INTRAVENOUS

## 2014-07-18 MED ORDER — ONDANSETRON HCL 4 MG/2ML IJ SOLN
4.0000 mg | Freq: Once | INTRAMUSCULAR | Status: AC
Start: 2014-07-18 — End: 2014-07-18
  Administered 2014-07-18: 4 mg via INTRAVENOUS
  Filled 2014-07-18: qty 2

## 2014-07-18 MED ORDER — TRAMADOL HCL 50 MG PO TABS: 50.0000 mg | ORAL_TABLET | Freq: Four times a day (QID) | ORAL | Status: DC | PRN

## 2014-07-18 MED ORDER — SODIUM CHLORIDE 0.9 % IV BOLUS (SEPSIS): 1000.0000 mL | Freq: Once | INTRAVENOUS | Status: DC

## 2014-07-18 MED ORDER — MORPHINE SULFATE 4 MG/ML IJ SOLN
INTRAMUSCULAR | Status: AC
  Filled 2014-07-18: qty 1

## 2014-07-18 MED ORDER — IOHEXOL 300 MG/ML  SOLN
50.0000 mL | Freq: Once | INTRAMUSCULAR | Status: AC | PRN
  Administered 2014-07-18: 50 mL via ORAL

## 2014-07-18 MED ORDER — MORPHINE SULFATE 4 MG/ML IJ SOLN
4.0000 mg | Freq: Once | INTRAMUSCULAR | Status: AC
  Administered 2014-07-18: 4 mg via INTRAVENOUS
  Filled 2014-07-18: qty 1

## 2014-07-18 NOTE — Discharge Instructions (Signed)
Tests were normal. Medication for pain and nausea. Followup your Dr.

## 2014-07-18 NOTE — ED Notes (Signed)
Patient with continues to have abdominal pain at this time.  Dr. Lacinda Axon spoke w/patient regarding outcome of tests. Respirations even and unlabored. Skin warm/dry. Discharge instructions reviewed with patient at this time. Patient given opportunity to voice concerns/ask questions. IV removed per policy and band-aid applied to site. Patient discharged at this time and left Emergency Department with steady gait.

## 2014-07-18 NOTE — ED Provider Notes (Signed)
CSN: [id number]     Arrival date & time 07/18/14  1259 History   First MD Initiated Contact with Patient 07/18/14 1520     Chief Complaint  Patient presents with  . Abdominal Pain     (Consider location/radiation/quality/duration/timing/severity/associated sxs/prior Treatment) HPI.... upper abdominal pain for one week with radiation to the bilateral flanks with associated vomiting, fever, but no diarrhea. She is status post cholecystectomy and appendectomy. She is drinking fluids but decreased volume. Severity is mild to moderate. Nothing makes symptoms better or worse. No vaginal bleeding or discharge.  Past Medical History  Diagnosis Date  . Asthma   . Migraine   . Chronic abdominal pain   . Chronic back pain    Past Surgical History  Procedure Laterality Date  . Appendectomy    . Cholecystectomy    . Tonsillectomy    . Tubal ligation    . Brain surgery     History reviewed. No pertinent family history. History  Substance Use Topics  . Smoking status: Current Every Day Smoker -- 0.50 packs/day for 15 years    Types: Cigarettes  . Smokeless tobacco: Never Used  . Alcohol Use: No   OB History   Grav Para Term Preterm Abortions TAB SAB Ect Mult Living   1 1 1       1      Review of Systems  All other systems reviewed and are negative.     Allergies  Bee venom; Ketorolac tromethamine; Imitrex; Isometheptene-apap-dichloral; and Tylenol with codeine #3  Home Medications   Prior to Admission medications   Medication Sig Start Date End Date Taking? Authorizing Provider  acetaminophen (TYLENOL) 500 MG tablet Take 500 mg by mouth every 6 (six) hours as needed for moderate pain.   Yes Historical Provider, MD  promethazine (PHENERGAN) 25 MG tablet Take 1 tablet (25 mg total) by mouth every 6 (six) hours as needed. 07/18/14   Nat Christen, MD  traMADol (ULTRAM) 50 MG tablet Take 1 tablet (50 mg total) by mouth every 6 (six) hours as needed. 07/18/14   Nat Christen, MD   BP  122/95  Pulse 97  Temp(Src) 98.1 F (36.7 C) (Oral)  Resp 16  SpO2 98%  LMP 07/10/2014 Physical Exam  Nursing note and vitals reviewed. Constitutional: She is oriented to person, place, and time. She appears well-developed and well-nourished.  HENT:  Head: Normocephalic and atraumatic.  Eyes: Conjunctivae and EOM are normal. Pupils are equal, round, and reactive to light.  Neck: Normal range of motion. Neck supple.  Cardiovascular: Normal rate, regular rhythm and normal heart sounds.   Pulmonary/Chest: Effort normal and breath sounds normal.  Abdominal: Soft. Bowel sounds are normal.  Minimal upper abdominal tenderness  Musculoskeletal: Normal range of motion.  Neurological: She is alert and oriented to person, place, and time.  Skin: Skin is warm and dry.  Psychiatric: She has a normal mood and affect. Her behavior is normal.    ED Course  Procedures (including critical care time) Labs Review Labs Reviewed  CBC WITH DIFFERENTIAL - Abnormal; Notable for the following:    WBC 11.9 (*)    All other components within normal limits  URINALYSIS, ROUTINE W REFLEX MICROSCOPIC - Abnormal; Notable for the following:    Hgb urine dipstick TRACE (*)    All other components within normal limits  COMPREHENSIVE METABOLIC PANEL  URINE MICROSCOPIC-ADD ON  LIPASE, BLOOD    Imaging Review Ct Abdomen Pelvis W Contrast  07/18/2014   CLINICAL  DATA:  Epigastric pain for several days.  EXAM: CT ABDOMEN AND PELVIS WITH CONTRAST  TECHNIQUE: Multidetector CT imaging of the abdomen and pelvis was performed using the standard protocol following bolus administration of intravenous contrast.  CONTRAST:  100 mL OMNIPAQUE IOHEXOL 300 MG/ML  SOLN  COMPARISON:  CT abdomen and pelvis 05/02/2014 and 01/14/2014.  FINDINGS: The lung bases are clear. There is no pleural or pericardial effusion.  The patient is status post cholecystectomy. Mild intrahepatic biliary ductal dilatation is unchanged and likely related  to gallbladder removal. Liver is otherwise normal appearance. The spleen, adrenal glands, pancreas and kidneys are unremarkable. Small calcification in the right adnexa which may be due to some prior inflammatory process is unchanged. The adnexae are otherwise unremarkable. Uterus and urinary bladder appear normal. The patient is status post appendectomy. The stomach and small and large bowel appear normal.  IMPRESSION: No acute finding or finding to explain patient's symptoms.  Status post cholecystectomy and appendectomy.   Electronically Signed   By: Inge Rise M.D.   On: 07/18/2014 17:31     EKG Interpretation None      MDM   Final diagnoses:  Abdominal pain    White count minimally elevated. CT abdomen and pelvis negative for acute findings. Urinalysis negative. No acute abdomen. Disharge medications tramadol 50 mg and Phenergan 25 mg.    Nat Christen, MD 07/18/14 [phone]

## 2014-07-18 NOTE — ED Notes (Signed)
Pt reports generalized abdominal pain,vomiting,fever x1 week. nad noted.

## 2014-08-04 ENCOUNTER — Encounter (HOSPITAL_COMMUNITY): Payer: Self-pay | Admitting: Emergency Medicine

## 2014-08-20 ENCOUNTER — Emergency Department (HOSPITAL_COMMUNITY): Payer: Medicaid Other

## 2014-08-20 ENCOUNTER — Encounter (HOSPITAL_COMMUNITY): Payer: Self-pay

## 2014-08-20 ENCOUNTER — Emergency Department (HOSPITAL_COMMUNITY)
Admission: EM | Admit: 2014-08-20 | Discharge: 2014-08-20 | Disposition: A | Discharge status: Home / Self Care | Payer: Medicaid Other | Attending: Emergency Medicine | Admitting: Emergency Medicine

## 2014-08-20 DIAGNOSIS — R079 Chest pain, unspecified: Secondary | ICD-10-CM | POA: Diagnosis not present

## 2014-08-20 DIAGNOSIS — Z8679 Personal history of other diseases of the circulatory system: Secondary | ICD-10-CM | POA: Diagnosis not present

## 2014-08-20 DIAGNOSIS — Z9049 Acquired absence of other specified parts of digestive tract: Secondary | ICD-10-CM | POA: Insufficient documentation

## 2014-08-20 DIAGNOSIS — J45909 Unspecified asthma, uncomplicated: Secondary | ICD-10-CM | POA: Diagnosis not present

## 2014-08-20 DIAGNOSIS — Y929 Unspecified place or not applicable: Secondary | ICD-10-CM | POA: Diagnosis not present

## 2014-08-20 DIAGNOSIS — K2970 Gastritis, unspecified, without bleeding: Secondary | ICD-10-CM | POA: Diagnosis not present

## 2014-08-20 DIAGNOSIS — S39012A Strain of muscle, fascia and tendon of lower back, initial encounter: Secondary | ICD-10-CM | POA: Diagnosis not present

## 2014-08-20 DIAGNOSIS — Z9851 Tubal ligation status: Secondary | ICD-10-CM | POA: Diagnosis not present

## 2014-08-20 DIAGNOSIS — R1110 Vomiting, unspecified: Secondary | ICD-10-CM

## 2014-08-20 DIAGNOSIS — Z720 Tobacco use: Secondary | ICD-10-CM | POA: Diagnosis not present

## 2014-08-20 DIAGNOSIS — Y939 Activity, unspecified: Secondary | ICD-10-CM | POA: Diagnosis not present

## 2014-08-20 DIAGNOSIS — X58XXXA Exposure to other specified factors, initial encounter: Secondary | ICD-10-CM | POA: Diagnosis not present

## 2014-08-20 DIAGNOSIS — R112 Nausea with vomiting, unspecified: Secondary | ICD-10-CM | POA: Diagnosis present

## 2014-08-20 DIAGNOSIS — Y999 Unspecified external cause status: Secondary | ICD-10-CM | POA: Diagnosis not present

## 2014-08-20 LAB — BASIC METABOLIC PANEL
ANION GAP: 12 (ref 5–15)
BUN: 13 mg/dL (ref 6–23)
CO2: 26 mEq/L (ref 19–32)
Calcium: 9.3 mg/dL (ref 8.4–10.5)
Chloride: 101 mEq/L (ref 96–112)
Creatinine, Ser: 0.71 mg/dL (ref 0.50–1.10)
GFR calc non Af Amer: >90 mL/min (ref >90)
Glucose, Bld: 96 mg/dL (ref 70–99)
POTASSIUM: 3.9 meq/L (ref 3.7–5.3)
SODIUM: 139 meq/L (ref 137–147)

## 2014-08-20 LAB — HEPATIC FUNCTION PANEL
ALT: 43 U/L — ABNORMAL HIGH (ref 0–35)
AST: 93 U/L — ABNORMAL HIGH (ref 0–37)
Albumin: 3.7 g/dL (ref 3.5–5.2)
Alkaline Phosphatase: 72 U/L (ref 39–117)
TOTAL PROTEIN: 7.1 g/dL (ref 6.0–8.3)
Total Bilirubin: 0.2 mg/dL — ABNORMAL LOW (ref 0.3–1.2)

## 2014-08-20 LAB — CBC WITH DIFFERENTIAL/PLATELET
BASOS ABS: 0.1 10*3/uL (ref 0.0–0.1)
Basophils Relative: 1 % (ref 0–1)
EOS ABS: 0.3 10*3/uL (ref 0.0–0.7)
Eosinophils Relative: 3 % (ref 0–5)
HCT: 38.9 % (ref 36.0–46.0)
Hemoglobin: 13.4 g/dL (ref 12.0–15.0)
LYMPHS ABS: 2.3 10*3/uL (ref 0.7–4.0)
Lymphocytes Relative: 25 % (ref 12–46)
MCH: 33.4 pg (ref 26.0–34.0)
MCHC: 34.4 g/dL (ref 30.0–36.0)
MCV: 97 fL (ref 78.0–100.0)
Monocytes Absolute: 0.6 10*3/uL (ref 0.1–1.0)
Monocytes Relative: 7 % (ref 3–12)
NEUTROS PCT: 64 % (ref 43–77)
Neutro Abs: 5.8 10*3/uL (ref 1.7–7.7)
PLATELETS: 268 10*3/uL (ref 150–400)
RBC: 4.01 MIL/uL (ref 3.87–5.11)
RDW: 13.3 % (ref 11.5–15.5)
WBC: 9.1 10*3/uL (ref 4.0–10.5)

## 2014-08-20 LAB — I-STAT CG4 LACTIC ACID, ED: Lactic Acid, Venous: 0.53 mmol/L (ref 0.5–2.2)

## 2014-08-20 LAB — TROPONIN I

## 2014-08-20 LAB — LIPASE, BLOOD: Lipase: 39 U/L (ref 11–59)

## 2014-08-20 MED ORDER — RANITIDINE HCL 150 MG PO TABS: 150.0000 mg | ORAL_TABLET | Freq: Two times a day (BID) | ORAL | Status: DC

## 2014-08-20 MED ORDER — HYDROMORPHONE HCL 1 MG/ML IJ SOLN
1.0000 mg | Freq: Once | INTRAMUSCULAR | Status: AC
  Administered 2014-08-20: 1 mg via INTRAVENOUS
  Filled 2014-08-20: qty 1

## 2014-08-20 MED ORDER — TRAMADOL HCL 50 MG PO TABS: 50.0000 mg | ORAL_TABLET | Freq: Four times a day (QID) | ORAL | Status: DC | PRN

## 2014-08-20 MED ORDER — GI COCKTAIL ~~LOC~~
30.0000 mL | Freq: Once | ORAL | Status: AC
  Administered 2014-08-20: 30 mL via ORAL
  Filled 2014-08-20: qty 30

## 2014-08-20 MED ORDER — ONDANSETRON HCL 4 MG PO TABS: 4.0000 mg | ORAL_TABLET | Freq: Four times a day (QID) | ORAL | Status: DC

## 2014-08-20 MED ORDER — ONDANSETRON HCL 4 MG/2ML IJ SOLN
4.0000 mg | Freq: Once | INTRAMUSCULAR | Status: AC
  Administered 2014-08-20: 4 mg via INTRAVENOUS
  Filled 2014-08-20: qty 2

## 2014-08-20 NOTE — Discharge Instructions (Signed)
Chest Pain (Nonspecific) °It is often hard to give a specific diagnosis for the cause of chest pain. There is always a chance that your pain could be related to something serious, such as a heart attack or a blood clot in the lungs. You need to follow up with your health care provider for further evaluation. °CAUSES  °· Heartburn. °· Pneumonia or bronchitis. °· Anxiety or stress. °· Inflammation around your heart (pericarditis) or lung (pleuritis or pleurisy). °· A blood clot in the lung. °· A collapsed lung (pneumothorax). It can develop suddenly on its own (spontaneous pneumothorax) or from trauma to the chest. °· Shingles infection (herpes zoster virus). °The chest wall is composed of bones, muscles, and cartilage. Any of these can be the source of the pain. °· The bones can be bruised by injury. °· The muscles or cartilage can be strained by coughing or overwork. °· The cartilage can be affected by inflammation and become sore (costochondritis). °DIAGNOSIS  °Lab tests or other studies may be needed to find the cause of your pain. Your health care provider may have you take a test called an ambulatory electrocardiogram (ECG). An ECG records your heartbeat patterns over a 24-hour period. You may also have other tests, such as: °· Transthoracic echocardiogram (TTE). During echocardiography, sound waves are used to evaluate how blood flows through your heart. °· Transesophageal echocardiogram (TEE). °· Cardiac monitoring. This allows your health care provider to monitor your heart rate and rhythm in real time. °· Holter monitor. This is a portable device that records your heartbeat and can help diagnose heart arrhythmias. It allows your health care provider to track your heart activity for several days, if needed. °· Stress tests by exercise or by giving medicine that makes the heart beat faster. °TREATMENT  °· Treatment depends on what may be causing your chest pain. Treatment may include: °· Acid blockers for  heartburn. °· Anti-inflammatory medicine. °· Pain medicine for inflammatory conditions. °· Antibiotics if an infection is present. °· You may be advised to change lifestyle habits. This includes stopping smoking and avoiding alcohol, caffeine, and chocolate. °· You may be advised to keep your head raised (elevated) when sleeping. This reduces the chance of acid going backward from your stomach into your esophagus. °Most of the time, nonspecific chest pain will improve within 2-3 days with rest and mild pain medicine.  °HOME CARE INSTRUCTIONS  °· If antibiotics were prescribed, take them as directed. Finish them even if you start to feel better. °· For the next few days, avoid physical activities that bring on chest pain. Continue physical activities as directed. °· Do not use any tobacco products, including cigarettes, chewing tobacco, or electronic cigarettes. °· Avoid drinking alcohol. °· Only take medicine as directed by your health care provider. °· Follow your health care provider's suggestions for further testing if your chest pain does not go away. °· Keep any follow-up appointments you made. If you do not go to an appointment, you could develop lasting (chronic) problems with pain. If there is any problem keeping an appointment, call to reschedule. °SEEK MEDICAL CARE IF:  °· Your chest pain does not go away, even after treatment. °· You have a rash with blisters on your chest. °· You have a fever. °SEEK IMMEDIATE MEDICAL CARE IF:  °· You have increased chest pain or pain that spreads to your arm, neck, jaw, back, or abdomen. °· You have shortness of breath. °· You have an increasing cough, or you cough   up blood.  You have severe back or abdominal pain.  You feel nauseous or vomit.  You have severe weakness.  You faint.  You have chills. This is an emergency. Do not wait to see if the pain will go away. Get medical help at once. Call your local emergency services (911 in [place] Do not drive  yourself to the hospital. MAKE SURE YOU:   Understand these instructions.  Will watch your condition.  Will get help right away if you are not doing well or get worse. Document Released: 06/29/2005 Document Revised: 09/24/2013 Document Reviewed: 04/24/2008 [facility] Patient Information 2015 [place], [place]. This information is not intended to replace advice given to you by your health care provider. Make sure you discuss any questions you have with your health care provider.  Gastritis, Adult Gastritis is soreness and swelling (inflammation) of the lining of the stomach. Gastritis can develop as a sudden onset (acute) or long-term (chronic) condition. If gastritis is not treated, it can lead to stomach bleeding and ulcers. CAUSES  Gastritis occurs when the stomach lining is weak or damaged. Digestive juices from the stomach then inflame the weakened stomach lining. The stomach lining may be weak or damaged due to viral or bacterial infections. One common bacterial infection is the Helicobacter pylori infection. Gastritis can also result from excessive alcohol consumption, taking certain medicines, or having too much acid in the stomach.  SYMPTOMS  In some cases, there are no symptoms. When symptoms are present, they may include:  Pain or a burning sensation in the upper abdomen.  Nausea.  Vomiting.  An uncomfortable feeling of fullness after eating. DIAGNOSIS  Your caregiver may suspect you have gastritis based on your symptoms and a physical exam. To determine the cause of your gastritis, your caregiver may perform the following:  Blood or stool tests to check for the H pylori bacterium.  Gastroscopy. A thin, flexible tube (endoscope) is passed down the esophagus and into the stomach. The endoscope has a light and camera on the end. Your caregiver uses the endoscope to view the inside of the stomach.  Taking a tissue sample (biopsy) from the stomach to examine under a  microscope. TREATMENT  Depending on the cause of your gastritis, medicines may be prescribed. If you have a bacterial infection, such as an H pylori infection, antibiotics may be given. If your gastritis is caused by too much acid in the stomach, H2 blockers or antacids may be given. Your caregiver may recommend that you stop taking aspirin, ibuprofen, or other nonsteroidal anti-inflammatory drugs (NSAIDs). HOME CARE INSTRUCTIONS  Only take over-the-counter or prescription medicines as directed by your caregiver.  If you were given antibiotic medicines, take them as directed. Finish them even if you start to feel better.  Drink enough fluids to keep your urine clear or pale yellow.  Avoid foods and drinks that make your symptoms worse, such as:  Caffeine or alcoholic drinks.  Chocolate.  Peppermint or mint flavorings.  Garlic and onions.  Spicy foods.  Citrus fruits, such as oranges, lemons, or limes.  Tomato-based foods such as sauce, chili, salsa, and pizza.  Fried and fatty foods.  Eat small, frequent meals instead of large meals. SEEK IMMEDIATE MEDICAL CARE IF:   You have black or dark red stools.  You vomit blood or material that looks like coffee grounds.  You are unable to keep fluids down.  Your abdominal pain gets worse.  You have a fever.  You do not feel better  after 1 week.  You have any other questions or concerns. MAKE SURE YOU:  Understand these instructions.  Will watch your condition.  Will get help right away if you are not doing well or get worse. Document Released: 09/13/2001 Document Revised: 03/20/2012 Document Reviewed: 11/02/2011 [facility] Patient Information 2015 [place], [place]. This information is not intended to replace advice given to you by your health care provider. Make sure you discuss any questions you have with your health care provider.  Lumbosacral Strain Lumbosacral strain is a strain of any of the parts that make up your  lumbosacral vertebrae. Your lumbosacral vertebrae are the bones that make up the lower third of your backbone. Your lumbosacral vertebrae are held together by muscles and tough, fibrous tissue (ligaments).  CAUSES  A sudden blow to your back can cause lumbosacral strain. Also, anything that causes an excessive stretch of the muscles in the low back can cause this strain. This is typically seen when people exert themselves strenuously, fall, lift heavy objects, bend, or crouch repeatedly. RISK FACTORS  Physically demanding work.  Participation in pushing or pulling sports or sports that require a sudden twist of the back (tennis, golf, baseball).  Weight lifting.  Excessive lower back curvature.  Forward-tilted pelvis.  Weak back or abdominal muscles or both.  Tight hamstrings. SIGNS AND SYMPTOMS  Lumbosacral strain may cause pain in the area of your injury or pain that moves (radiates) down your leg.  DIAGNOSIS Your health care provider can often diagnose lumbosacral strain through a physical exam. In some cases, you may need tests such as X-ray exams.  TREATMENT  Treatment for your lower back injury depends on many factors that your clinician will have to evaluate. However, most treatment will include the use of anti-inflammatory medicines. HOME CARE INSTRUCTIONS   Avoid hard physical activities (tennis, racquetball, waterskiing) if you are not in proper physical condition for it. This may aggravate or create problems.  If you have a back problem, avoid sports requiring sudden body movements. Swimming and walking are generally safer activities.  Maintain good posture.  Maintain a healthy weight.  For acute conditions, you may put ice on the injured area.  Put ice in a plastic bag.  Place a towel between your skin and the bag.  Leave the ice on for 20 minutes, 2-3 times a day.  When the low back starts healing, stretching and strengthening exercises may be recommended. SEEK  MEDICAL CARE IF:  Your back pain is getting worse.  You experience severe back pain not relieved with medicines. SEEK IMMEDIATE MEDICAL CARE IF:   You have numbness, tingling, weakness, or problems with the use of your arms or legs.  There is a change in bowel or bladder control.  You have increasing pain in any area of the body, including your belly (abdomen).  You notice shortness of breath, dizziness, or feel faint.  You feel sick to your stomach (nauseous), are throwing up (vomiting), or become sweaty.  You notice discoloration of your toes or legs, or your feet get very cold. MAKE SURE YOU:   Understand these instructions.  Will watch your condition.  Will get help right away if you are not doing well or get worse. Document Released: 06/29/2005 Document Revised: 09/24/2013 Document Reviewed: 05/08/2013 [facility] Patient Information 2015 [place], [place]. This information is not intended to replace advice given to you by your health care provider. Make sure you discuss any questions you have with your health care provider.  Nausea  and Vomiting Nausea is a sick feeling that often comes before throwing up (vomiting). Vomiting is a reflex where stomach contents come out of your mouth. Vomiting can cause severe loss of body fluids (dehydration). Children and elderly adults can become dehydrated quickly, especially if they also have diarrhea. Nausea and vomiting are symptoms of a condition or disease. It is important to find the cause of your symptoms. CAUSES   Direct irritation of the stomach lining. This irritation can result from increased acid production (gastroesophageal reflux disease), infection, food poisoning, taking certain medicines (such as nonsteroidal anti-inflammatory drugs), alcohol use, or tobacco use.  Signals from the brain.These signals could be caused by a headache, heat exposure, an inner ear disturbance, increased pressure in the brain from injury, infection, a  tumor, or a concussion, pain, emotional stimulus, or metabolic problems.  An obstruction in the gastrointestinal tract (bowel obstruction).  Illnesses such as diabetes, hepatitis, gallbladder problems, appendicitis, kidney problems, cancer, sepsis, atypical symptoms of a heart attack, or eating disorders.  Medical treatments such as chemotherapy and radiation.  Receiving medicine that makes you sleep (general anesthetic) during surgery. DIAGNOSIS Your caregiver may ask for tests to be done if the problems do not improve after a few days. Tests may also be done if symptoms are severe or if the reason for the nausea and vomiting is not clear. Tests may include:  Urine tests.  Blood tests.  Stool tests.  Cultures (to look for evidence of infection).  X-rays or other imaging studies. Test results can help your caregiver make decisions about treatment or the need for additional tests. TREATMENT You need to stay well hydrated. Drink frequently but in small amounts.You may wish to drink water, sports drinks, clear broth, or eat frozen ice pops or gelatin dessert to help stay hydrated.When you eat, eating slowly may help prevent nausea.There are also some antinausea medicines that may help prevent nausea. HOME CARE INSTRUCTIONS   Take all medicine as directed by your caregiver.  If you do not have an appetite, do not force yourself to eat. However, you must continue to drink fluids.  If you have an appetite, eat a normal diet unless your caregiver tells you differently.  Eat a variety of complex carbohydrates (rice, wheat, potatoes, bread), lean meats, yogurt, fruits, and vegetables.  Avoid high-fat foods because they are more difficult to digest.  Drink enough water and fluids to keep your urine clear or pale yellow.  If you are dehydrated, ask your caregiver for specific rehydration instructions. Signs of dehydration may include:  Severe thirst.  Dry lips and  mouth.  Dizziness.  Dark urine.  Decreasing urine frequency and amount.  Confusion.  Rapid breathing or pulse. SEEK IMMEDIATE MEDICAL CARE IF:   You have blood or brown flecks (like coffee grounds) in your vomit.  You have black or bloody stools.  You have a severe headache or stiff neck.  You are confused.  You have severe abdominal pain.  You have chest pain or trouble breathing.  You do not urinate at least once every 8 hours.  You develop cold or clammy skin.  You continue to vomit for longer than 24 to 48 hours.  You have a fever. MAKE SURE YOU:   Understand these instructions.  Will watch your condition.  Will get help right away if you are not doing well or get worse. Document Released: 09/19/2005 Document Revised: 12/12/2011 Document Reviewed: 02/16/2011 [facility] Patient Information 2015 [place], [place]. This information is not intended  to replace advice given to you by your health care provider. Make sure you discuss any questions you have with your health care provider. ° °

## 2014-08-20 NOTE — ED Provider Notes (Signed)
CSN: [id number]     Arrival date & time 08/20/14  1945 History  This chart was scribe for Orpah Greek, * by Judithann Sauger, ED Scribe. The patient was seen in room APA07/APA07 and the patient's care was started at 8:59 PM.     Chief Complaint  Patient presents with  . Emesis   Patient is a 38 y.o. female presenting with vomiting.  Emesis Associated symptoms: no diarrhea    HPI Comments: Bonnie Jensen is a 38 y.o. female who presents to the Emergency Department complaining of emesis onset this morning while at work. She reports 8 episodes since onset. She states that when she went home from work, she had CP that radiates to her back. She reports a pat surgical hx of appendectomy and cholecystectomy.   Past Medical History  Diagnosis Date  . Asthma   . Migraine   . Chronic abdominal pain   . Chronic back pain    Past Surgical History  Procedure Laterality Date  . Appendectomy    . Cholecystectomy    . Tonsillectomy    . Tubal ligation    . Brain surgery     No family history on file. History  Substance Use Topics  . Smoking status: Current Every Day Smoker -- 0.50 packs/day for 15 years    Types: Cigarettes  . Smokeless tobacco: Never Used  . Alcohol Use: No   OB History    Gravida Para Term Preterm AB TAB SAB Ectopic Multiple Living   1 1 1       1      Review of Systems  Constitutional: Negative for fever.  Cardiovascular: Positive for chest pain.  Gastrointestinal: Positive for nausea and vomiting. Negative for diarrhea.  Musculoskeletal: Positive for back pain.  All other systems reviewed and are negative.     Allergies  Bee venom; Ketorolac tromethamine; Imitrex; Isometheptene-apap-dichloral; and Tylenol with codeine #3  Home Medications   Prior to Admission medications   Medication Sig Start Date End Date Taking? Authorizing Provider  acetaminophen (TYLENOL) 500 MG tablet Take 500 mg by mouth every 6 (six) hours as needed for moderate pain.     Historical Provider, MD  ondansetron (ZOFRAN) 4 MG tablet Take 1 tablet (4 mg total) by mouth every 6 (six) hours. 08/20/14   Orpah Greek, MD  ranitidine (ZANTAC) 150 MG tablet Take 1 tablet (150 mg total) by mouth 2 (two) times daily. 08/20/14   Orpah Greek, MD  traMADol (ULTRAM) 50 MG tablet Take 1 tablet (50 mg total) by mouth every 6 (six) hours as needed. 08/20/14   Orpah Greek, MD   BP 100/76 mmHg  Pulse 78  Temp(Src) 97.7 F (36.5 C) (Oral)  Resp 23  Ht 5' (1.524 m)  Wt 176 lb (79.833 kg)  BMI 34.37 kg/m2  SpO2 96%  LMP 07/29/2014 Physical Exam  Constitutional: She is oriented to person, place, and time. She appears well-developed and well-nourished. No distress.  HENT:  Head: Normocephalic and atraumatic.  Right Ear: Hearing normal.  Left Ear: Hearing normal.  Nose: Nose normal.  Mouth/Throat: Oropharynx is clear and moist and mucous membranes are normal.  Eyes: Conjunctivae and EOM are normal. Pupils are equal, round, and reactive to light.  Neck: Normal range of motion. Neck supple.  Cardiovascular: Regular rhythm, S1 normal and S2 normal.  Exam reveals no gallop and no friction rub.   No murmur heard. Pulmonary/Chest: Effort normal and breath sounds normal. No respiratory  distress. She exhibits no tenderness.  Abdominal: Soft. Normal appearance and bowel sounds are normal. There is no hepatosplenomegaly. There is no tenderness. There is no rebound, no guarding, no tenderness at McBurney's point and negative Murphy's sign. No hernia.  Musculoskeletal: Normal range of motion.       Lumbar back: She exhibits tenderness.       Back:  Neurological: She is alert and oriented to person, place, and time. She has normal strength. No cranial nerve deficit or sensory deficit. Coordination normal. GCS eye subscore is 4. GCS verbal subscore is 5. GCS motor subscore is 6.  Skin: Skin is warm, dry and intact. No rash noted. No cyanosis.  Psychiatric:  She has a normal mood and affect. Her speech is normal and behavior is normal. Thought content normal.  Nursing note and vitals reviewed.   ED Course  Procedures (including critical care time) DIAGNOSTIC STUDIES: Oxygen Saturation is 100% on RA, normal by my interpretation.    COORDINATION OF CARE: 9:02 PM- Pt advised of plan for treatment and pt agrees.   Labs Review Labs Reviewed  HEPATIC FUNCTION PANEL - Abnormal; Notable for the following:    AST 93 (*)    ALT 43 (*)    Total Bilirubin 0.2 (*)    All other components within normal limits  CBC WITH DIFFERENTIAL  BASIC METABOLIC PANEL  LIPASE, BLOOD  TROPONIN I  URINALYSIS, ROUTINE W REFLEX MICROSCOPIC  I-STAT CG4 LACTIC ACID, ED    Imaging Review Dg Chest 2 View  08/20/2014   CLINICAL DATA:  Bilateral chest pain, initial encounter  EXAM: CHEST  2 VIEW  COMPARISON:  10/28/2013  FINDINGS: Cardiac shadow is within normal limits. The lungs are clear bilaterally. The bony structures are within normal limits.  IMPRESSION: No active cardiopulmonary disease.   Electronically Signed   By: Inez Catalina M.D.   On: 08/20/2014 21:18     EKG Interpretation   Date/Time:  Wednesday August 20 2014 19:51:18 EST Ventricular Rate:  85 PR Interval:  134 QRS Duration: 74 QT Interval:  350 QTC Calculation: 416 R Axis:   78 Text Interpretation:  Normal sinus rhythm Normal ECG Confirmed by POLLINA   MD, CHRISTOPHER [phone]) on 08/20/2014 8:06:00 PM      MDM   Final diagnoses:  Acute vomiting  Gastritis  Lumbar strain, initial encounter   Patient presents to the ER for evaluation of abdominal pain, nausea, vomiting, chest pain. Patient reports the symptoms have been present through the course of the day. Patient reports that she had onset of nausea and vomiting first. This occurred overnight and has been present through the day. She was started to notice pain in her lower back radiating into the legs. Examination reveals significant  tenderness in the soft tissues in the paraspinal region and worsening of pain with bending and flexing of the pack. This is likely soft tissue and muscular nature, related to the retching and vomiting. She does not have any neurologic deficit.  Patient's abdominal exam is benign. There is no guarding or rebound. Patient is status post appendectomy and cholecystectomy. No concern for acute intra-abdominal pathology. She has normal bowel sounds, no distention, no concern for small bowel obstruction.  Patient had onset of chest pain earlier today as well. This has been constant and there are no alleviating or exacerbating factors. He does not appear typical for cardiac chest pain. Patient's EKG is normal. Troponin negative. No concern for acute coronary syndrome as the pain is  present most of the day. This is likely secondary to her nausea and vomiting, possibly gastritis. Patient will be treated empirically, follow up with primary care or return if symptoms worsen.  I personally performed the services described in this documentation, which was scribed in my presence. The recorded information has been reviewed and is accurate.    Orpah Greek, MD 08/20/14 [id number]

## 2014-08-20 NOTE — ED Notes (Signed)
Onset of back pain and vomiting onset 1am, vomiting onset 12noon

## 2014-09-08 ENCOUNTER — Encounter (HOSPITAL_COMMUNITY): Payer: Self-pay | Admitting: Cardiology

## 2014-09-08 ENCOUNTER — Emergency Department (HOSPITAL_COMMUNITY)
Admission: EM | Admit: 2014-09-08 | Discharge: 2014-09-08 | Disposition: A | Discharge status: Home / Self Care | Payer: Medicaid Other | Attending: Emergency Medicine | Admitting: Emergency Medicine

## 2014-09-08 DIAGNOSIS — M545 Low back pain: Secondary | ICD-10-CM | POA: Diagnosis present

## 2014-09-08 DIAGNOSIS — Z720 Tobacco use: Secondary | ICD-10-CM | POA: Diagnosis not present

## 2014-09-08 DIAGNOSIS — Z8659 Personal history of other mental and behavioral disorders: Secondary | ICD-10-CM | POA: Diagnosis not present

## 2014-09-08 DIAGNOSIS — M5440 Lumbago with sciatica, unspecified side: Secondary | ICD-10-CM | POA: Diagnosis not present

## 2014-09-08 DIAGNOSIS — M5441 Lumbago with sciatica, right side: Secondary | ICD-10-CM

## 2014-09-08 DIAGNOSIS — G8929 Other chronic pain: Secondary | ICD-10-CM | POA: Insufficient documentation

## 2014-09-08 DIAGNOSIS — J45909 Unspecified asthma, uncomplicated: Secondary | ICD-10-CM | POA: Diagnosis not present

## 2014-09-08 DIAGNOSIS — R112 Nausea with vomiting, unspecified: Secondary | ICD-10-CM | POA: Diagnosis not present

## 2014-09-08 DIAGNOSIS — M5442 Lumbago with sciatica, left side: Secondary | ICD-10-CM

## 2014-09-08 DIAGNOSIS — Z3202 Encounter for pregnancy test, result negative: Secondary | ICD-10-CM | POA: Insufficient documentation

## 2014-09-08 LAB — URINALYSIS, ROUTINE W REFLEX MICROSCOPIC
BILIRUBIN URINE: NEGATIVE
Glucose, UA: NEGATIVE mg/dL
Hgb urine dipstick: NEGATIVE
Ketones, ur: NEGATIVE mg/dL
LEUKOCYTES UA: NEGATIVE
NITRITE: NEGATIVE
PROTEIN: NEGATIVE mg/dL
Specific Gravity, Urine: 1.025 (ref 1.005–1.030)
Urobilinogen, UA: 2 mg/dL — ABNORMAL HIGH (ref 0.0–1.0)
pH: 7.5 (ref 5.0–8.0)

## 2014-09-08 LAB — CBC WITH DIFFERENTIAL/PLATELET
BASOS PCT: 1 % (ref 0–1)
Basophils Absolute: 0.1 10*3/uL (ref 0.0–0.1)
Eosinophils Absolute: 0.1 10*3/uL (ref 0.0–0.7)
Eosinophils Relative: 2 % (ref 0–5)
HEMATOCRIT: 43.6 % (ref 36.0–46.0)
Hemoglobin: 14.6 g/dL (ref 12.0–15.0)
Lymphocytes Relative: 24 % (ref 12–46)
Lymphs Abs: 1.9 10*3/uL (ref 0.7–4.0)
MCH: 33 pg (ref 26.0–34.0)
MCHC: 33.5 g/dL (ref 30.0–36.0)
MCV: 98.4 fL (ref 78.0–100.0)
MONO ABS: 0.5 10*3/uL (ref 0.1–1.0)
Monocytes Relative: 7 % (ref 3–12)
NEUTROS ABS: 5.4 10*3/uL (ref 1.7–7.7)
NEUTROS PCT: 66 % (ref 43–77)
Platelets: 280 10*3/uL (ref 150–400)
RBC: 4.43 MIL/uL (ref 3.87–5.11)
RDW: 13.1 % (ref 11.5–15.5)
WBC: 8 10*3/uL (ref 4.0–10.5)

## 2014-09-08 LAB — COMPREHENSIVE METABOLIC PANEL
ALBUMIN: 3.8 g/dL (ref 3.5–5.2)
ALT: 40 U/L — ABNORMAL HIGH (ref 0–35)
ANION GAP: 12 (ref 5–15)
AST: 21 U/L (ref 0–37)
Alkaline Phosphatase: 79 U/L (ref 39–117)
BUN: 8 mg/dL (ref 6–23)
CO2: 25 mEq/L (ref 19–32)
CREATININE: 0.77 mg/dL (ref 0.50–1.10)
Calcium: 9.7 mg/dL (ref 8.4–10.5)
Chloride: 103 mEq/L (ref 96–112)
GFR calc Af Amer: >90 mL/min (ref >90)
GFR calc non Af Amer: >90 mL/min (ref >90)
Glucose, Bld: 92 mg/dL (ref 70–99)
Potassium: 4.2 mEq/L (ref 3.7–5.3)
Sodium: 140 mEq/L (ref 137–147)
TOTAL PROTEIN: 7.4 g/dL (ref 6.0–8.3)

## 2014-09-08 LAB — LIPASE, BLOOD: LIPASE: 30 U/L (ref 11–59)

## 2014-09-08 LAB — POC URINE PREG, ED: Preg Test, Ur: NEGATIVE

## 2014-09-08 MED ORDER — OXYCODONE-ACETAMINOPHEN 5-325 MG PO TABS
1.0000 | ORAL_TABLET | Freq: Once | ORAL | Status: AC
  Administered 2014-09-08: 1 via ORAL
  Filled 2014-09-08: qty 1

## 2014-09-08 MED ORDER — ONDANSETRON 8 MG PO TBDP
8.0000 mg | ORAL_TABLET | Freq: Once | ORAL | Status: AC
  Administered 2014-09-08: 8 mg via ORAL
  Filled 2014-09-08: qty 1

## 2014-09-08 MED ORDER — CYCLOBENZAPRINE HCL 10 MG PO TABS: 10.0000 mg | ORAL_TABLET | Freq: Three times a day (TID) | ORAL | Status: DC | PRN

## 2014-09-08 MED ORDER — HYDROCODONE-ACETAMINOPHEN 5-325 MG PO TABS: ORAL_TABLET | ORAL | Status: DC

## 2014-09-08 MED ORDER — CYCLOBENZAPRINE HCL 10 MG PO TABS
10.0000 mg | ORAL_TABLET | Freq: Once | ORAL | Status: AC
  Administered 2014-09-08: 10 mg via ORAL
  Filled 2014-09-08: qty 1

## 2014-09-08 NOTE — ED Provider Notes (Signed)
CSN: [id number]     Arrival date & time 09/08/14  1004 History   First MD Initiated Contact with Patient 09/08/14 1106     Chief Complaint  Patient presents with  . Back Pain  . Emesis     (Consider location/radiation/quality/duration/timing/severity/associated sxs/prior Treatment) HPI  Bonnie Jensen is a 38 y.o. female who presents to the Emergency Department complaining of low back pain for 2 weeks.  She states this is a recurring problem and notes that at times the pain is so intense, that she begins vomiting.  She reports intermittent vomiting for several days.  She states the back pain is similar to previous and she describes a sharp , throbbing pain across her back and into both buttocks.  She has tried OTC medications without relief.  She denies abdominal pain, fever, chills, dysuria, incontinence of bladder or bowel, numbness or weakness of the LE's. Patient has previous cholecystectomy and appendectomy.   Past Medical History  Diagnosis Date  . Asthma   . Migraine   . Chronic abdominal pain   . Chronic back pain    Past Surgical History  Procedure Laterality Date  . Appendectomy    . Cholecystectomy    . Tonsillectomy    . Tubal ligation    . Brain surgery     History reviewed. No pertinent family history. History  Substance Use Topics  . Smoking status: Current Every Day Smoker -- 0.50 packs/day for 15 years    Types: Cigarettes  . Smokeless tobacco: Never Used  . Alcohol Use: No   OB History    Gravida Para Term Preterm AB TAB SAB Ectopic Multiple Living   1 1 1       1      Review of Systems  Constitutional: Negative for fever and chills.  Respiratory: Negative for shortness of breath.   Gastrointestinal: Positive for vomiting. Negative for abdominal pain, diarrhea, constipation and abdominal distention.  Genitourinary: Negative for dysuria, hematuria, flank pain, decreased urine volume and difficulty urinating.       Low back pain  Musculoskeletal:  Positive for back pain. Negative for joint swelling.  Skin: Negative for rash.  Neurological: Negative for weakness and numbness.  All other systems reviewed and are negative.     Allergies  Bee venom; Ketorolac tromethamine; Imitrex; Isometheptene-apap-dichloral; and Tylenol with codeine #3  Home Medications   Prior to Admission medications   Medication Sig Start Date End Date Taking? Authorizing Provider  acetaminophen (TYLENOL) 500 MG tablet Take 500 mg by mouth every 6 (six) hours as needed for moderate pain.   Yes Historical Provider, MD  ibuprofen (ADVIL,MOTRIN) 200 MG tablet Take 800 mg by mouth every 6 (six) hours as needed for fever or moderate pain.   Yes Historical Provider, MD  ondansetron (ZOFRAN) 4 MG tablet Take 1 tablet (4 mg total) by mouth every 6 (six) hours. Patient not taking: Reported on 09/08/2014 08/20/14   Orpah Greek, MD  ranitidine (ZANTAC) 150 MG tablet Take 1 tablet (150 mg total) by mouth 2 (two) times daily. Patient not taking: Reported on 09/08/2014 08/20/14   Orpah Greek, MD  traMADol (ULTRAM) 50 MG tablet Take 1 tablet (50 mg total) by mouth every 6 (six) hours as needed. Patient not taking: Reported on 09/08/2014 08/20/14   Orpah Greek, MD   BP 136/91 mmHg  Pulse 91  Temp(Src) 98.1 F (36.7 C) (Oral)  Resp 18  Ht 5' (1.524 m)  Wt 175 lb (  79.379 kg)  BMI 34.18 kg/m2  SpO2 100%  LMP 07/29/2014 Physical Exam  Constitutional: She is oriented to person, place, and time. She appears well-developed and well-nourished. No distress.  HENT:  Head: Normocephalic and atraumatic.  Neck: Normal range of motion. Neck supple.  Cardiovascular: Normal rate, regular rhythm, normal heart sounds and intact distal pulses.   No murmur heard. Pulmonary/Chest: Effort normal and breath sounds normal. No respiratory distress.  Abdominal: Soft. She exhibits no distension and no mass. There is no tenderness. There is no rebound and no  guarding.  Musculoskeletal: She exhibits tenderness. She exhibits no edema.       Lumbar back: She exhibits tenderness and pain. She exhibits normal range of motion, no swelling, no deformity, no laceration and normal pulse.  Diffuse ttp of the bilateral lumbar paraspinal muscles.  No spinal tenderness.  DP pulses are brisk and symmetrical.  Distal sensation intact.  Hip Flexors/Extensors are intact.  Pt has 5/5 strength against resistance of bilateral lower extremities.     Neurological: She is alert and oriented to person, place, and time. She has normal strength. No sensory deficit. She exhibits normal muscle tone. Coordination and gait normal.  Reflex Scores:      Patellar reflexes are 2+ on the right side and 2+ on the left side.      Achilles reflexes are 2+ on the right side and 2+ on the left side. Skin: Skin is warm and dry. No rash noted.  Nursing note and vitals reviewed.   ED Course  Procedures (including critical care time) Labs Review Labs Reviewed  URINALYSIS, ROUTINE W REFLEX MICROSCOPIC - Abnormal; Notable for the following:    Urobilinogen, UA 2.0 (*)    All other components within normal limits  COMPREHENSIVE METABOLIC PANEL - Abnormal; Notable for the following:    ALT 40 (*)    Total Bilirubin <0.2 (*)    All other components within normal limits  CBC WITH DIFFERENTIAL  LIPASE, BLOOD  URINE MICROSCOPIC-ADD ON  POC URINE PREG, ED    Imaging Review No results found.   EKG Interpretation None      MDM   Final diagnoses:  Bilateral low back pain with sciatica, sciatica laterality unspecified  Non-intractable vomiting with nausea, vomiting of unspecified type    Pt with hx of recurrent low backpain and chronic abdominal pain.  She is non toxic appearing, no concerning sx's for acute abdomen or emergent neurological or infectious process.    Pt had neg CT abd/pelvis on 07/18/14 and also seen here on 08/20/14 for back pain, vomiting   1320  Patient is  walking around in the exam room.  Pain has improved, reports feeling better and requesting discharge.  informed the nursing staff that she needs to leave because her ride has to go to work.  Appears stable for d/c and agrees to return if sx's worsen.    Shandell Jallow L. Vanessa Damascus, PA-C 09/09/14 2145  Francine Graven, DO 09/10/14 2318

## 2014-09-08 NOTE — ED Notes (Signed)
Patient is resting comfortably. 

## 2014-09-08 NOTE — Discharge Instructions (Signed)
Back Pain, Adult Back pain is very common. The pain often gets better over time. The cause of back pain is usually not dangerous. Most people can learn to manage their back pain on their own.  HOME CARE   Stay active. Start with short walks on flat ground if you can. Try to walk farther each day.  Do not sit, drive, or stand in one place for more than 30 minutes. Do not stay in bed.  Do not avoid exercise or work. Activity can help your back heal faster.  Be careful when you bend or lift an object. Bend at your knees, keep the object close to you, and do not twist.  Sleep on a firm mattress. Lie on your side, and bend your knees. If you lie on your back, put a pillow under your knees.  Only take medicines as told by your doctor.  Put ice on the injured area.  Put ice in a plastic bag.  Place a towel between your skin and the bag.  Leave the ice on for 15-20 minutes, 03-04 times a day for the first 2 to 3 days. After that, you can switch between ice and heat packs.  Ask your doctor about back exercises or massage.  Avoid feeling anxious or stressed. Find good ways to deal with stress, such as exercise. GET HELP RIGHT AWAY IF:   Your pain does not go away with rest or medicine.  Your pain does not go away in 1 week.  You have new problems.  You do not feel well.  The pain spreads into your legs.  You cannot control when you poop (bowel movement) or pee (urinate).  Your arms or legs feel weak or lose feeling (numbness).  You feel sick to your stomach (nauseous) or throw up (vomit).  You have belly (abdominal) pain.  You feel like you may pass out (faint). MAKE SURE YOU:   Understand these instructions.  Will watch your condition.  Will get help right away if you are not doing well or get worse. Document Released: 03/07/2008 Document Revised: 12/12/2011 Document Reviewed: 01/21/2014 [facility] Patient Information 2015 [place], [place]. This information is not intended  to replace advice given to you by your health care provider. Make sure you discuss any questions you have with your health care provider.  Nausea and Vomiting Nausea means you feel sick to your stomach. Throwing up (vomiting) is a reflex where stomach contents come out of your mouth. HOME CARE   Take medicine as told by your doctor.  Do not force yourself to eat. However, you do need to drink fluids.  If you feel like eating, eat a normal diet as told by your doctor.  Eat rice, wheat, potatoes, bread, lean meats, yogurt, fruits, and vegetables.  Avoid high-fat foods.  Drink enough fluids to keep your pee (urine) clear or pale yellow.  Ask your doctor how to replace body fluid losses (rehydrate). Signs of body fluid loss (dehydration) include:  Feeling very thirsty.  Dry lips and mouth.  Feeling dizzy.  Dark pee.  Peeing less than normal.  Feeling confused.  Fast breathing or heart rate. GET HELP RIGHT AWAY IF:   You have blood in your throw up.  You have black or bloody poop (stool).  You have a bad headache or stiff neck.  You feel confused.  You have bad belly (abdominal) pain.  You have chest pain or trouble breathing.  You do not pee at least once every 8  hours.  You have cold, clammy skin.  You keep throwing up after 24 to 48 hours.  You have a fever. MAKE SURE YOU:   Understand these instructions.  Will watch your condition.  Will get help right away if you are not doing well or get worse. Document Released: 03/07/2008 Document Revised: 12/12/2011 Document Reviewed: 02/18/2011 [facility] Patient Information 2015 [place], [place]. This information is not intended to replace advice given to you by your health care provider. Make sure you discuss any questions you have with your health care provider.

## 2014-09-08 NOTE — ED Notes (Signed)
Lower back pain times 2 weeks.  States the pain gets so back she starts vomiting.

## 2015-05-11 ENCOUNTER — Emergency Department (HOSPITAL_COMMUNITY)
Admission: EM | Admit: 2015-05-11 | Discharge: 2015-05-11 | Disposition: A | Discharge status: Home / Self Care | Payer: Medicaid Other | Attending: Emergency Medicine | Admitting: Emergency Medicine

## 2015-05-11 ENCOUNTER — Encounter (HOSPITAL_COMMUNITY): Payer: Self-pay | Admitting: Emergency Medicine

## 2015-05-11 DIAGNOSIS — J45909 Unspecified asthma, uncomplicated: Secondary | ICD-10-CM | POA: Diagnosis not present

## 2015-05-11 DIAGNOSIS — M545 Low back pain: Secondary | ICD-10-CM | POA: Diagnosis not present

## 2015-05-11 DIAGNOSIS — G43909 Migraine, unspecified, not intractable, without status migrainosus: Secondary | ICD-10-CM | POA: Insufficient documentation

## 2015-05-11 DIAGNOSIS — Z3202 Encounter for pregnancy test, result negative: Secondary | ICD-10-CM | POA: Diagnosis not present

## 2015-05-11 DIAGNOSIS — M79604 Pain in right leg: Secondary | ICD-10-CM | POA: Insufficient documentation

## 2015-05-11 DIAGNOSIS — M79605 Pain in left leg: Secondary | ICD-10-CM | POA: Diagnosis present

## 2015-05-11 DIAGNOSIS — Z720 Tobacco use: Secondary | ICD-10-CM | POA: Insufficient documentation

## 2015-05-11 DIAGNOSIS — G8929 Other chronic pain: Secondary | ICD-10-CM | POA: Diagnosis not present

## 2015-05-11 LAB — URINALYSIS, ROUTINE W REFLEX MICROSCOPIC
Bilirubin Urine: NEGATIVE
GLUCOSE, UA: NEGATIVE mg/dL
HGB URINE DIPSTICK: NEGATIVE
Ketones, ur: NEGATIVE mg/dL
LEUKOCYTES UA: NEGATIVE
NITRITE: NEGATIVE
PROTEIN: NEGATIVE mg/dL
Specific Gravity, Urine: 1.02 (ref 1.005–1.030)
UROBILINOGEN UA: 0.2 mg/dL (ref 0.0–1.0)
pH: 6 (ref 5.0–8.0)

## 2015-05-11 LAB — PREGNANCY, URINE: Preg Test, Ur: NEGATIVE

## 2015-05-11 MED ORDER — BACLOFEN 10 MG PO TABS: 10.0000 mg | ORAL_TABLET | Freq: Three times a day (TID) | ORAL | Status: AC

## 2015-05-11 MED ORDER — ACETAMINOPHEN 500 MG PO TABS
1000.0000 mg | ORAL_TABLET | Freq: Once | ORAL | Status: AC
  Administered 2015-05-11: 1000 mg via ORAL
  Filled 2015-05-11: qty 2

## 2015-05-11 MED ORDER — DEXAMETHASONE 4 MG PO TABS: 4.0000 mg | ORAL_TABLET | Freq: Two times a day (BID) | ORAL | Status: DC

## 2015-05-11 MED ORDER — DIAZEPAM 5 MG PO TABS
5.0000 mg | ORAL_TABLET | Freq: Once | ORAL | Status: AC
  Administered 2015-05-11: 5 mg via ORAL
  Filled 2015-05-11: qty 1

## 2015-05-11 MED ORDER — PREDNISONE 50 MG PO TABS
60.0000 mg | ORAL_TABLET | Freq: Once | ORAL | Status: AC
  Administered 2015-05-11: 60 mg via ORAL
  Filled 2015-05-11 (×2): qty 1

## 2015-05-11 NOTE — Discharge Instructions (Signed)
Your urine test is negative for infection or kidney stone. No gross neurologic deficit noted on today's exam. Please discuss these symptoms with your  Medicaid Access MD for management and follow up. Use baclofen three times daily and decadron while arranging to see your access MD. Back Pain, Adult Back pain is very common. The pain often gets better over time. The cause of back pain is usually not dangerous. Most people can learn to manage their back pain on their own.  HOME CARE   Stay active. Start with short walks on flat ground if you can. Try to walk farther each day.  Do not sit, drive, or stand in one place for more than 30 minutes. Do not stay in bed.  Do not avoid exercise or work. Activity can help your back heal faster.  Be careful when you bend or lift an object. Bend at your knees, keep the object close to you, and do not twist.  Sleep on a firm mattress. Lie on your side, and bend your knees. If you lie on your back, put a pillow under your knees.  Only take medicines as told by your doctor.  Put ice on the injured area.  Put ice in a plastic bag.  Place a towel between your skin and the bag.  Leave the ice on for 15-20 minutes, 03-04 times a day for the first 2 to 3 days. After that, you can switch between ice and heat packs.  Ask your doctor about back exercises or massage.  Avoid feeling anxious or stressed. Find good ways to deal with stress, such as exercise. GET HELP RIGHT AWAY IF:   Your pain does not go away with rest or medicine.  Your pain does not go away in 1 week.  You have new problems.  You do not feel well.  The pain spreads into your legs.  You cannot control when you poop (bowel movement) or pee (urinate).  Your arms or legs feel weak or lose feeling (numbness).  You feel sick to your stomach (nauseous) or throw up (vomit).  You have belly (abdominal) pain.  You feel like you may pass out (faint). MAKE SURE YOU:   Understand these  instructions.  Will watch your condition.  Will get help right away if you are not doing well or get worse. Document Released: 03/07/2008 Document Revised: 12/12/2011 Document Reviewed: 01/21/2014 [facility] Patient Information 2015 [place], [place]. This information is not intended to replace advice given to you by your health care provider. Make sure you discuss any questions you have with your health care provider.

## 2015-05-11 NOTE — ED Provider Notes (Signed)
CSN: [id number]     Arrival date & time 05/11/15  1551 History  This chart was scribed for non-physician practitioner, Lily Kocher, PA-C, working with Fredia Sorrow, MD, by Stephania Fragmin, ED Scribe. This patient was seen in room APFT24/APFT24 and the patient's care was started at 4:41 PM.    Chief Complaint  Patient presents with  . Leg Pain   Patient is a 39 y.o. female presenting with leg pain. The history is provided by the patient and the spouse. No language interpreter was used.  Leg Pain Location:  Leg Injury: no   Leg location:  L leg and R leg Pain details:    Quality:  Aching and throbbing   Radiates to:  Does not radiate   Severity:  Moderate   Onset quality:  Gradual   Duration:  2 weeks   Timing:  Constant Chronicity:  New Relieved by:  None tried Worsened by:  Nothing tried Ineffective treatments:  None tried Associated symptoms: back pain and stiffness     HPI Comments: Bonnie Jensen is a 39 y.o. female who presents to the Emergency Department complaining of constant aching, throbbing, bilateral leg pain that began 2 weeks ago with NKI. She also complains of associated stiffness, which causes difficulty walking. Patient She denies a history of frequent falls. She also denies a history of any chronic medical conditions. She denies bowel or bladder incontinence. She denies ever seeing an orthopedist or neurologist for her symptoms. Her husband expresses concern because she is constantly wearing flat-soled sandals with little support, rather than tennis shoes.   Patient also complains of some low back pain for the past 2 weeks.  Past Medical History  Diagnosis Date  . Asthma   . Migraine   . Chronic abdominal pain   . Chronic back pain    Past Surgical History  Procedure Laterality Date  . Appendectomy    . Cholecystectomy    . Tonsillectomy    . Tubal ligation    . Brain surgery     History reviewed. No pertinent family history. History  Substance Use Topics   . Smoking status: Current Every Day Smoker -- 1.00 packs/day for 15 years    Types: Cigarettes  . Smokeless tobacco: Never Used  . Alcohol Use: No   OB History    Gravida Para Term Preterm AB TAB SAB Ectopic Multiple Living   1 1 1       1      Review of Systems  Musculoskeletal: Positive for myalgias, back pain and stiffness.  All other systems reviewed and are negative.  Allergies  Bee venom; Ketorolac tromethamine; Imitrex; Isometheptene-dichloral-apap; and Tylenol with codeine #3  Home Medications   Prior to Admission medications   Medication Sig Start Date End Date Taking? Authorizing Provider  acetaminophen (TYLENOL) 500 MG tablet Take 500 mg by mouth every 6 (six) hours as needed for moderate pain.    Historical Provider, MD  cyclobenzaprine (FLEXERIL) 10 MG tablet Take 1 tablet (10 mg total) by mouth 3 (three) times daily as needed. 09/08/14   Tammy Triplett, PA-C  HYDROcodone-acetaminophen (NORCO/VICODIN) 5-325 MG per tablet Take one-two tabs po q 4-6 hrs prn pain 09/08/14   Tammy Triplett, PA-C  ibuprofen (ADVIL,MOTRIN) 200 MG tablet Take 800 mg by mouth every 6 (six) hours as needed for fever or moderate pain.    Historical Provider, MD   BP 130/78 mmHg  Pulse 80  Temp(Src) 97.8 F (36.6 C) (Oral)  Resp 20  Ht 4\' 11"  (1.499 m)  Wt 155 lb (70.308 kg)  BMI 31.29 kg/m2  SpO2 100%  LMP 05/29/2014 Physical Exam  Constitutional: She is oriented to person, place, and time. She appears well-developed and well-nourished. No distress.  HENT:  Head: Normocephalic and atraumatic.  Eyes: Conjunctivae and EOM are normal.  Neck: Neck supple. No tracheal deviation present.  Cardiovascular: Normal rate, regular rhythm and normal heart sounds.  Exam reveals no gallop and no friction rub.   No murmur heard. Pulmonary/Chest: Effort normal and breath sounds normal. No respiratory distress. She has no wheezes. She has no rales.  Musculoskeletal: Normal range of motion.   Neurological: She is alert and oriented to person, place, and time.  No motor strength, sensory, or balance deficits appreciated of the BLE. Gait is intact. No foot drop. Grip strength equally bilaterally.  Skin: Skin is warm and dry.  Psychiatric: She has a normal mood and affect. Her behavior is normal.  Nursing note and vitals reviewed.   ED Course  Procedures (including critical care time)  DIAGNOSTIC STUDIES: Oxygen Saturation is 100% on RA, normal by my interpretation.    COORDINATION OF CARE: 4:51 PM - Discussed treatment plan with pt at bedside which includes UA, and pt agreed to plan.   Labs Review Labs Reviewed  URINALYSIS, ROUTINE W REFLEX MICROSCOPIC (NOT AT [facility])  PREGNANCY, URINE   Results for orders placed or performed during the hospital encounter of 05/11/15  Urinalysis, Routine w reflex microscopic (not at [facility])  Result Value Ref Range   Color, Urine YELLOW YELLOW   APPearance CLEAR CLEAR   Specific Gravity, Urine 1.020 1.005 - 1.030   pH 6.0 5.0 - 8.0   Glucose, UA NEGATIVE NEGATIVE mg/dL   Hgb urine dipstick NEGATIVE NEGATIVE   Bilirubin Urine NEGATIVE NEGATIVE   Ketones, ur NEGATIVE NEGATIVE mg/dL   Protein, ur NEGATIVE NEGATIVE mg/dL   Urobilinogen, UA 0.2 0.0 - 1.0 mg/dL   Nitrite NEGATIVE NEGATIVE   Leukocytes, UA NEGATIVE NEGATIVE  Pregnancy, urine  Result Value Ref Range   Preg Test, Ur NEGATIVE NEGATIVE    MDM  Vital signs stable.  No gross neuro deficit. No evidence for kidney stone or UTI. Pt ambulatory after medication. No evidence for cauda equina.  Rx baclofen and decadron given.    Final diagnoses:  None    **I personally performed the services described in this documentation, which was scribed in my presence. The recorded information has been reviewed and is accurate.*  I have reviewed nursing notes, vital signs, and all appropriate lab and imaging results for this patient.  Lily Kocher, PA-C 05/12/15 [place],  MD 05/14/15 [phone]

## 2015-05-11 NOTE — ED Notes (Signed)
Pt verbalized understanding of no driving and to use caution within 4 hours of taking pain meds due to meds cause drowsiness 

## 2015-05-11 NOTE — ED Notes (Signed)
Pt ambulatory to bathroom without difficulty.  

## 2015-05-11 NOTE — ED Notes (Signed)
Pt states that she has been having bilateral leg pain -with swelling and lower back pain for past 2 weeks

## 2015-08-28 ENCOUNTER — Encounter (HOSPITAL_COMMUNITY): Payer: Self-pay

## 2015-08-28 ENCOUNTER — Emergency Department (HOSPITAL_COMMUNITY)
Admission: EM | Admit: 2015-08-28 | Discharge: 2015-08-29 | Disposition: A | Discharge status: Home / Self Care | Payer: Medicaid Other | Attending: Emergency Medicine | Admitting: Emergency Medicine

## 2015-08-28 ENCOUNTER — Emergency Department (HOSPITAL_COMMUNITY): Payer: Medicaid Other

## 2015-08-28 DIAGNOSIS — Z8679 Personal history of other diseases of the circulatory system: Secondary | ICD-10-CM | POA: Insufficient documentation

## 2015-08-28 DIAGNOSIS — M549 Dorsalgia, unspecified: Secondary | ICD-10-CM | POA: Insufficient documentation

## 2015-08-28 DIAGNOSIS — G8929 Other chronic pain: Secondary | ICD-10-CM | POA: Insufficient documentation

## 2015-08-28 DIAGNOSIS — R0789 Other chest pain: Secondary | ICD-10-CM | POA: Insufficient documentation

## 2015-08-28 DIAGNOSIS — Z79899 Other long term (current) drug therapy: Secondary | ICD-10-CM | POA: Insufficient documentation

## 2015-08-28 DIAGNOSIS — B349 Viral infection, unspecified: Secondary | ICD-10-CM | POA: Insufficient documentation

## 2015-08-28 DIAGNOSIS — R062 Wheezing: Secondary | ICD-10-CM

## 2015-08-28 DIAGNOSIS — J45901 Unspecified asthma with (acute) exacerbation: Secondary | ICD-10-CM | POA: Insufficient documentation

## 2015-08-28 DIAGNOSIS — Z3202 Encounter for pregnancy test, result negative: Secondary | ICD-10-CM | POA: Insufficient documentation

## 2015-08-28 LAB — URINALYSIS, ROUTINE W REFLEX MICROSCOPIC
Bilirubin Urine: NEGATIVE
Glucose, UA: NEGATIVE mg/dL
Hgb urine dipstick: NEGATIVE
Ketones, ur: NEGATIVE mg/dL
Leukocytes, UA: NEGATIVE
Nitrite: NEGATIVE
Protein, ur: NEGATIVE mg/dL
Specific Gravity, Urine: 1.01 (ref 1.005–1.030)
pH: 6 (ref 5.0–8.0)

## 2015-08-28 LAB — CBC WITH DIFFERENTIAL/PLATELET
Basophils Absolute: 0 10*3/uL (ref 0.0–0.1)
Basophils Relative: 0 %
EOS ABS: 0.2 10*3/uL (ref 0.0–0.7)
Eosinophils Relative: 2 %
HCT: 39.3 % (ref 36.0–46.0)
HEMOGLOBIN: 13.4 g/dL (ref 12.0–15.0)
LYMPHS ABS: 3.2 10*3/uL (ref 0.7–4.0)
Lymphocytes Relative: 31 %
MCH: 33.8 pg (ref 26.0–34.0)
MCHC: 34.1 g/dL (ref 30.0–36.0)
MCV: 99 fL (ref 78.0–100.0)
Monocytes Absolute: 0.4 10*3/uL (ref 0.1–1.0)
Monocytes Relative: 4 %
NEUTROS ABS: 6.6 10*3/uL (ref 1.7–7.7)
NEUTROS PCT: 63 %
Platelets: 321 10*3/uL (ref 150–400)
RBC: 3.97 MIL/uL (ref 3.87–5.11)
RDW: 13.7 % (ref 11.5–15.5)
WBC: 10.4 10*3/uL (ref 4.0–10.5)

## 2015-08-28 LAB — PREGNANCY, URINE: PREG TEST UR: NEGATIVE

## 2015-08-28 MED ORDER — ALBUTEROL SULFATE (2.5 MG/3ML) 0.083% IN NEBU
2.5000 mg | INHALATION_SOLUTION | Freq: Once | RESPIRATORY_TRACT | Status: AC
  Administered 2015-08-28: 2.5 mg via RESPIRATORY_TRACT
  Filled 2015-08-28: qty 3

## 2015-08-28 MED ORDER — IPRATROPIUM-ALBUTEROL 0.5-2.5 (3) MG/3ML IN SOLN
3.0000 mL | Freq: Once | RESPIRATORY_TRACT | Status: AC
  Administered 2015-08-28: 3 mL via RESPIRATORY_TRACT
  Filled 2015-08-28: qty 3

## 2015-08-28 MED ORDER — SODIUM CHLORIDE 0.9 % IV BOLUS (SEPSIS)
1000.0000 mL | Freq: Once | INTRAVENOUS | Status: AC
  Administered 2015-08-28: 1000 mL via INTRAVENOUS

## 2015-08-28 MED ORDER — ONDANSETRON HCL 4 MG/2ML IJ SOLN
4.0000 mg | Freq: Once | INTRAMUSCULAR | Status: AC
  Administered 2015-08-28: 4 mg via INTRAVENOUS
  Filled 2015-08-28: qty 2

## 2015-08-28 MED ORDER — MORPHINE SULFATE (PF) 4 MG/ML IV SOLN
4.0000 mg | Freq: Once | INTRAVENOUS | Status: AC
  Administered 2015-08-28: 4 mg via INTRAVENOUS
  Filled 2015-08-28: qty 1

## 2015-08-28 NOTE — ED Notes (Signed)
I have been vomiting for a week, and last night I fell down and I hurt my right shoulder. I am hurting in my chest and back, feels like i have the flu.

## 2015-08-28 NOTE — ED Provider Notes (Signed)
CSN: [id number]     Arrival date & time 08/28/15  2228 History   First MD Initiated Contact with Patient 08/28/15 2247     Chief Complaint  Patient presents with  . Emesis     (Consider location/radiation/quality/duration/timing/severity/associated sxs/prior Treatment) The history is provided by the patient.   Bonnie Jensen is a 39 y.o. female with a past medical history of chronic abdominal and back pain presenting with a one week history of nausea and vomiting, stating she has been unable to keep anything down by mouth.  She endorses subjective fever along with body aches.  In addition reports a nonproductive cough along with wheezing and shortness of breath.  She denies chest pain, abdominal pain, diarrhea, dysuria.  She reports feeling weak, lightheaded with standing and actually fell yesterday in her home,  Hitting her shoulder and right chest with the fall. She denies headache, neck pain or other injury.  She has taken ibuprofen without improvement in symptoms.    Past Medical History  Diagnosis Date  . Asthma   . Migraine   . Chronic abdominal pain   . Chronic back pain    Past Surgical History  Procedure Laterality Date  . Appendectomy    . Cholecystectomy    . Tonsillectomy    . Tubal ligation    . Brain surgery     History reviewed. No pertinent family history. Social History  Substance Use Topics  . Smoking status: Current Every Day Smoker -- 1.00 packs/day for 15 years    Types: Cigarettes  . Smokeless tobacco: Never Used  . Alcohol Use: No   OB History    Gravida Para Term Preterm AB TAB SAB Ectopic Multiple Living   1 1 1       1      Review of Systems  Constitutional: Positive for fever and fatigue.  HENT: Negative for congestion and sore throat.   Eyes: Negative.   Respiratory: Positive for cough, chest tightness, shortness of breath and wheezing.   Cardiovascular: Negative for chest pain.  Gastrointestinal: Positive for nausea and vomiting. Negative  for abdominal pain and diarrhea.  Genitourinary: Negative.   Musculoskeletal: Positive for back pain. Negative for joint swelling, arthralgias and neck pain.  Skin: Negative.  Negative for rash and wound.  Neurological: Positive for weakness. Negative for dizziness, light-headedness, numbness and headaches.  Psychiatric/Behavioral: Negative.       Allergies  Bee venom; Ketorolac tromethamine; Imitrex; Isometheptene-dichloral-apap; and Tylenol with codeine #3  Home Medications   Prior to Admission medications   Medication Sig Start Date End Date Taking? Authorizing Provider  ibuprofen (ADVIL,MOTRIN) 200 MG tablet Take 200 mg by mouth every 6 (six) hours as needed for mild pain or moderate pain.   Yes Historical Provider, MD  dexamethasone (DECADRON) 4 MG tablet Take 1 tablet (4 mg total) by mouth 2 (two) times daily with a meal. Patient not taking: Reported on 08/28/2015 05/11/15   Lily Kocher, PA-C  HYDROcodone-acetaminophen (NORCO/VICODIN) 5-325 MG per tablet Take one-two tabs po q 4-6 hrs prn pain Patient not taking: Reported on 08/28/2015 09/08/14   Tammy Triplett, PA-C  ondansetron (ZOFRAN ODT) 8 MG disintegrating tablet Take 1 tablet (8 mg total) by mouth every 8 (eight) hours as needed for nausea or vomiting. 08/29/15   Evalee Jefferson, PA-C  predniSONE (DELTASONE) 10 MG tablet 6, 5, 4, 3, 2 then 1 tablet by mouth daily for 6 days total. 08/29/15   Evalee Jefferson, PA-C  promethazine (PHENERGAN) 25  MG tablet Take 1 tablet (25 mg total) by mouth every 6 (six) hours as needed for nausea or vomiting. 08/29/15   Evalee Jefferson, PA-C   BP 108/71 mmHg  Pulse 90  Temp(Src) 98.3 F (36.8 C) (Oral)  Resp 18  Ht 4\' 11"  (1.499 m)  Wt 72.576 kg  BMI 32.30 kg/m2  SpO2 97% Physical Exam  Constitutional: She appears well-developed and well-nourished.  HENT:  Head: Normocephalic and atraumatic.  Eyes: Conjunctivae are normal.  Neck: Normal range of motion. Neck supple.  Cardiovascular: Normal rate,  regular rhythm, normal heart sounds and intact distal pulses.   Pulmonary/Chest: Effort normal. No stridor. She has wheezes. She exhibits tenderness. She exhibits no retraction.    Reproducible pain right upper chest and axilla.  No hematoma, no bruising. No crepitus. Expiratory wheeze with prolonged respirations.  Abdominal: Soft. Bowel sounds are normal. She exhibits no distension. There is no tenderness. There is no guarding.  Musculoskeletal: Normal range of motion.  Neurological: She is alert.  Skin: Skin is warm and dry.  Psychiatric: She has a normal mood and affect.  Nursing note and vitals reviewed.   ED Course  Procedures (including critical care time) Labs Review Labs Reviewed  COMPREHENSIVE METABOLIC PANEL - Abnormal; Notable for the following:    Glucose, Bld 103 (*)    All other components within normal limits  CBC WITH DIFFERENTIAL/PLATELET  LIPASE, BLOOD  URINALYSIS, ROUTINE W REFLEX MICROSCOPIC (NOT AT [facility])  PREGNANCY, URINE  TROPONIN I    Imaging Review Dg Chest 2 View  08/29/2015  CLINICAL DATA:  Acute onset of productive cough, wheezing, shortness of breath, vomiting and mid chest pain. Initial encounter. EXAM: CHEST  2 VIEW COMPARISON:  Chest radiograph performed 08/20/2014 FINDINGS: The lungs are well-aerated and clear. There is no evidence of focal opacification, pleural effusion or pneumothorax. The heart is normal in size; the mediastinal contour is within normal limits. No acute osseous abnormalities are seen. IMPRESSION: No acute cardiopulmonary process seen. Electronically Signed   By: Garald Balding M.D.   On: 08/29/2015 00:28   I have personally reviewed and evaluated these images and lab results as part of my medical decision-making.   EKG Interpretation   Date/Time:  Saturday August 29 2015 01:04:36 EST Ventricular Rate:  85 PR Interval:  142 QRS Duration: 85 QT Interval:  370 QTC Calculation: 440 R Axis:   53 Text Interpretation:  Sinus  rhythm Low voltage, precordial leads When  compared with ECG of 08/20/2014, No significant change was found Confirmed  by [facility]  MD, DAVID ([id number]) on 08/29/2015 1:06:58 AM      MDM   Final diagnoses:  Viral syndrome  Wheezing    Patients  labs reviewed and unremarkable.  Radiological studies were viewed, interpreted and considered during the medical decision making and disposition process. I agree with radiologists reading.  Results were also discussed with patient.   Medications  sodium chloride 0.9 % bolus 1,000 mL (1,000 mLs Intravenous New Bag/Given 08/29/15 0120)  predniSONE (DELTASONE) tablet 60 mg (not administered)  albuterol (PROVENTIL HFA;VENTOLIN HFA) 108 (90 BASE) MCG/ACT inhaler 2 puff (not administered)  ondansetron (ZOFRAN) injection 4 mg (4 mg Intravenous Given 08/28/15 2348)  sodium chloride 0.9 % bolus 1,000 mL (0 mLs Intravenous Stopped 08/29/15 0116)  ipratropium-albuterol (DUONEB) 0.5-2.5 (3) MG/3ML nebulizer solution 3 mL (3 mLs Nebulization Given 08/28/15 2356)  albuterol (PROVENTIL) (2.5 MG/3ML) 0.083% nebulizer solution 2.5 mg (2.5 mg Nebulization Given 08/28/15 2356)  morphine 4 MG/ML  injection 4 mg (4 mg Intravenous Given 08/28/15 2348)  promethazine (PHENERGAN) injection 12.5 mg (12.5 mg Intravenous Given 08/29/15 0134)  HYDROmorphone (DILAUDID) injection 0.5 mg (0.5 mg Intravenous Given 08/29/15 0134)   Patient was given 2 L of normal saline, also given by mouth fluid challenge which she passed.  Albuterol and Atrovent nebulizer therapy with resolution of wheezing, improved aeration and decreased chest pressure after this treatment.  Zofran was given with no resolution of nausea although she had no emesis following the department.  Phenergan was given which gave better symptom relief.  She was given IV morphine to help with body aches and back pain with pain improvement.  Patient was given prednisone 60 mg and an albuterol MDI prior to discharge.  Prednisone  taper, Phenergan prescribed.  Encouraged rest, increase fluid intake, brat diet.  When necessary follow-up either here or with PCP if symptoms persist or are not improving over the next several days.    Evalee Jefferson, PA-C [id number]  David Glick, MD [id number]

## 2015-08-29 LAB — TROPONIN I: Troponin I: <0.03 ng/mL (ref <0.031)

## 2015-08-29 LAB — COMPREHENSIVE METABOLIC PANEL
ALT: 15 U/L (ref 14–54)
AST: 15 U/L (ref 15–41)
Albumin: 4.1 g/dL (ref 3.5–5.0)
Alkaline Phosphatase: 63 U/L (ref 38–126)
Anion gap: 11 (ref 5–15)
BILIRUBIN TOTAL: 0.5 mg/dL (ref 0.3–1.2)
BUN: 11 mg/dL (ref 6–20)
CALCIUM: 9.2 mg/dL (ref 8.9–10.3)
CO2: 25 mmol/L (ref 22–32)
CREATININE: 0.72 mg/dL (ref 0.44–1.00)
Chloride: 104 mmol/L (ref 101–111)
Glucose, Bld: 103 mg/dL — ABNORMAL HIGH (ref 65–99)
Potassium: 3.7 mmol/L (ref 3.5–5.1)
SODIUM: 140 mmol/L (ref 135–145)
TOTAL PROTEIN: 7.3 g/dL (ref 6.5–8.1)

## 2015-08-29 LAB — LIPASE, BLOOD: LIPASE: 38 U/L (ref 11–51)

## 2015-08-29 MED ORDER — ALBUTEROL SULFATE HFA 108 (90 BASE) MCG/ACT IN AERS
2.0000 | INHALATION_SPRAY | Freq: Once | RESPIRATORY_TRACT | Status: AC
  Administered 2015-08-29: 2 via RESPIRATORY_TRACT
  Filled 2015-08-29: qty 6.7

## 2015-08-29 MED ORDER — SODIUM CHLORIDE 0.9 % IV BOLUS (SEPSIS)
1000.0000 mL | Freq: Once | INTRAVENOUS | Status: AC
Start: 2015-08-29 — End: 2015-08-29
  Administered 2015-08-29: 1000 mL via INTRAVENOUS

## 2015-08-29 MED ORDER — PREDNISONE 50 MG PO TABS
60.0000 mg | ORAL_TABLET | Freq: Once | ORAL | Status: AC
  Administered 2015-08-29: 60 mg via ORAL
  Filled 2015-08-29: qty 1

## 2015-08-29 MED ORDER — HYDROMORPHONE HCL 1 MG/ML IJ SOLN
0.5000 mg | Freq: Once | INTRAMUSCULAR | Status: AC
  Administered 2015-08-29: 0.5 mg via INTRAVENOUS
  Filled 2015-08-29: qty 1

## 2015-08-29 MED ORDER — PREDNISONE 10 MG PO TABS: ORAL_TABLET | ORAL | Status: DC

## 2015-08-29 MED ORDER — PROMETHAZINE HCL 25 MG/ML IJ SOLN
12.5000 mg | Freq: Once | INTRAMUSCULAR | Status: AC
  Administered 2015-08-29: 12.5 mg via INTRAVENOUS
  Filled 2015-08-29: qty 1

## 2015-08-29 MED ORDER — ONDANSETRON 8 MG PO TBDP: 8.0000 mg | ORAL_TABLET | Freq: Three times a day (TID) | ORAL | Status: DC | PRN

## 2015-08-29 MED ORDER — PROMETHAZINE HCL 25 MG PO TABS: 25.0000 mg | ORAL_TABLET | Freq: Four times a day (QID) | ORAL | Status: DC | PRN

## 2015-08-29 NOTE — Discharge Instructions (Signed)
Viral Infections A viral infection can be caused by different types of viruses.Most viral infections are not serious and resolve on their own. However, some infections may cause severe symptoms and may lead to further complications. SYMPTOMS Viruses can frequently cause:  Minor sore throat.  Aches and pains.  Headaches.  Runny nose.  Different types of rashes.  Watery eyes.  Tiredness.  Cough.  Loss of appetite.  Gastrointestinal infections, resulting in nausea, vomiting, and diarrhea. These symptoms do not respond to antibiotics because the infection is not caused by bacteria. However, you might catch a bacterial infection following the viral infection. This is sometimes called a "superinfection." Symptoms of such a bacterial infection may include:  Worsening sore throat with pus and difficulty swallowing.  Swollen neck glands.  Chills and a high or persistent fever.  Severe headache.  Tenderness over the sinuses.  Persistent overall ill feeling (malaise), muscle aches, and tiredness (fatigue).  Persistent cough.  Yellow, green, or brown mucus production with coughing. HOME CARE INSTRUCTIONS   Only take over-the-counter or prescription medicines for pain, discomfort, diarrhea, or fever as directed by your caregiver.  Drink enough water and fluids to keep your urine clear or pale yellow. Sports drinks can provide valuable electrolytes, sugars, and hydration.  Get plenty of rest and maintain proper nutrition. Soups and broths with crackers or rice are fine. SEEK IMMEDIATE MEDICAL CARE IF:   You have severe headaches, shortness of breath, chest pain, neck pain, or an unusual rash.  You have uncontrolled vomiting, diarrhea, or you are unable to keep down fluids.  You or your child has an oral temperature above 102 F (38.9 C), not controlled by medicine.  Your baby is older than 3 months with a rectal temperature of 102 F (38.9 C) or higher.  Your baby is 28  months old or younger with a rectal temperature of 100.4 F (38 C) or higher. MAKE SURE YOU:   Understand these instructions.  Will watch your condition.  Will get help right away if you are not doing well or get worse.   This information is not intended to replace advice given to you by your health care provider. Make sure you discuss any questions you have with your health care provider.   Document Released: 06/29/2005 Document Revised: 12/12/2011 Document Reviewed: 02/25/2015 Elsevier Interactive Patient Education 2016 [employer]    Use 2 puffs of your albuterol inhaler if you are wheezing or short of breath.  You may use the phenergan if needed for persistent nausea.  I recommend the b.r.a.t. Diet - bananas, rice, applesauce, toast - these are foods that can help with your nausea. Rest and make sure you are drinking plenty of fluids.  Take your next dose of prednisone tomorrow evening - this will help with your body aches and also with your wheezing and cough.

## 2015-09-01 ENCOUNTER — Emergency Department (HOSPITAL_COMMUNITY): Payer: Medicaid Other

## 2015-09-01 ENCOUNTER — Emergency Department (HOSPITAL_COMMUNITY)
Admission: EM | Admit: 2015-09-01 | Discharge: 2015-09-01 | Disposition: A | Discharge status: Home / Self Care | Payer: Medicaid Other | Attending: Emergency Medicine | Admitting: Emergency Medicine

## 2015-09-01 ENCOUNTER — Encounter (HOSPITAL_COMMUNITY): Payer: Self-pay

## 2015-09-01 DIAGNOSIS — S199XXA Unspecified injury of neck, initial encounter: Secondary | ICD-10-CM | POA: Insufficient documentation

## 2015-09-01 DIAGNOSIS — M20011 Mallet finger of right finger(s): Secondary | ICD-10-CM | POA: Insufficient documentation

## 2015-09-01 DIAGNOSIS — F10920 Alcohol use, unspecified with intoxication, uncomplicated: Secondary | ICD-10-CM

## 2015-09-01 DIAGNOSIS — J45909 Unspecified asthma, uncomplicated: Secondary | ICD-10-CM | POA: Insufficient documentation

## 2015-09-01 DIAGNOSIS — S299XXA Unspecified injury of thorax, initial encounter: Secondary | ICD-10-CM | POA: Insufficient documentation

## 2015-09-01 DIAGNOSIS — S8002XA Contusion of left knee, initial encounter: Secondary | ICD-10-CM

## 2015-09-01 DIAGNOSIS — S60221A Contusion of right hand, initial encounter: Secondary | ICD-10-CM

## 2015-09-01 DIAGNOSIS — S60222A Contusion of left hand, initial encounter: Secondary | ICD-10-CM

## 2015-09-01 DIAGNOSIS — Y9389 Activity, other specified: Secondary | ICD-10-CM | POA: Insufficient documentation

## 2015-09-01 DIAGNOSIS — Z3202 Encounter for pregnancy test, result negative: Secondary | ICD-10-CM | POA: Insufficient documentation

## 2015-09-01 DIAGNOSIS — Y998 Other external cause status: Secondary | ICD-10-CM | POA: Insufficient documentation

## 2015-09-01 DIAGNOSIS — S29002A Unspecified injury of muscle and tendon of back wall of thorax, initial encounter: Secondary | ICD-10-CM | POA: Insufficient documentation

## 2015-09-01 DIAGNOSIS — Y92410 Unspecified street and highway as the place of occurrence of the external cause: Secondary | ICD-10-CM | POA: Insufficient documentation

## 2015-09-01 DIAGNOSIS — F17210 Nicotine dependence, cigarettes, uncomplicated: Secondary | ICD-10-CM | POA: Insufficient documentation

## 2015-09-01 DIAGNOSIS — G8929 Other chronic pain: Secondary | ICD-10-CM | POA: Insufficient documentation

## 2015-09-01 DIAGNOSIS — Z7952 Long term (current) use of systemic steroids: Secondary | ICD-10-CM | POA: Insufficient documentation

## 2015-09-01 DIAGNOSIS — Z8679 Personal history of other diseases of the circulatory system: Secondary | ICD-10-CM | POA: Insufficient documentation

## 2015-09-01 DIAGNOSIS — F10120 Alcohol abuse with intoxication, uncomplicated: Secondary | ICD-10-CM | POA: Insufficient documentation

## 2015-09-01 DIAGNOSIS — S60021A Contusion of right index finger without damage to nail, initial encounter: Secondary | ICD-10-CM | POA: Insufficient documentation

## 2015-09-01 DIAGNOSIS — S3992XA Unspecified injury of lower back, initial encounter: Secondary | ICD-10-CM | POA: Insufficient documentation

## 2015-09-01 DIAGNOSIS — S5002XA Contusion of left elbow, initial encounter: Secondary | ICD-10-CM

## 2015-09-01 LAB — CBC WITH DIFFERENTIAL/PLATELET
BASOS ABS: 0.1 10*3/uL (ref 0.0–0.1)
BASOS PCT: 0 %
EOS PCT: 1 %
Eosinophils Absolute: 0.1 10*3/uL (ref 0.0–0.7)
HCT: 41.5 % (ref 36.0–46.0)
Hemoglobin: 13.8 g/dL (ref 12.0–15.0)
Lymphocytes Relative: 11 %
Lymphs Abs: 2.2 10*3/uL (ref 0.7–4.0)
MCH: 32.4 pg (ref 26.0–34.0)
MCHC: 33.3 g/dL (ref 30.0–36.0)
MCV: 97.4 fL (ref 78.0–100.0)
Monocytes Absolute: 1.1 10*3/uL — ABNORMAL HIGH (ref 0.1–1.0)
Monocytes Relative: 5 %
Neutro Abs: 16.6 10*3/uL — ABNORMAL HIGH (ref 1.7–7.7)
Neutrophils Relative %: 83 %
PLATELETS: 320 10*3/uL (ref 150–400)
RBC: 4.26 MIL/uL (ref 3.87–5.11)
RDW: 13.4 % (ref 11.5–15.5)
WBC: 20 10*3/uL — ABNORMAL HIGH (ref 4.0–10.5)

## 2015-09-01 LAB — I-STAT BETA HCG BLOOD, ED (MC, WL, AP ONLY): I-stat hCG, quantitative: <5 m[IU]/mL (ref <5)

## 2015-09-01 LAB — COMPREHENSIVE METABOLIC PANEL
ALBUMIN: 4 g/dL (ref 3.5–5.0)
ALT: 33 U/L (ref 14–54)
ANION GAP: 12 (ref 5–15)
AST: 47 U/L — ABNORMAL HIGH (ref 15–41)
Alkaline Phosphatase: 61 U/L (ref 38–126)
BUN: 6 mg/dL (ref 6–20)
CALCIUM: 9.2 mg/dL (ref 8.9–10.3)
CO2: 20 mmol/L — ABNORMAL LOW (ref 22–32)
Chloride: 106 mmol/L (ref 101–111)
Creatinine, Ser: 0.59 mg/dL (ref 0.44–1.00)
GFR calc Af Amer: >60 mL/min (ref >60)
GFR calc non Af Amer: >60 mL/min (ref >60)
GLUCOSE: 110 mg/dL — AB (ref 65–99)
Potassium: 3.6 mmol/L (ref 3.5–5.1)
SODIUM: 138 mmol/L (ref 135–145)
TOTAL PROTEIN: 7.1 g/dL (ref 6.5–8.1)
Total Bilirubin: 0.5 mg/dL (ref 0.3–1.2)

## 2015-09-01 LAB — ETHANOL: Alcohol, Ethyl (B): 138 mg/dL — ABNORMAL HIGH (ref <5)

## 2015-09-01 MED ORDER — ACETAMINOPHEN 325 MG PO TABS
650.0000 mg | ORAL_TABLET | Freq: Once | ORAL | Status: AC
  Administered 2015-09-01: 650 mg via ORAL
  Filled 2015-09-01: qty 2

## 2015-09-01 MED ORDER — OXYCODONE HCL 5 MG PO TABS
5.0000 mg | ORAL_TABLET | Freq: Once | ORAL | Status: AC
  Administered 2015-09-01: 5 mg via ORAL
  Filled 2015-09-01: qty 1

## 2015-09-01 MED ORDER — TRAMADOL HCL 50 MG PO TABS
50.0000 mg | ORAL_TABLET | Freq: Four times a day (QID) | ORAL | Status: DC | PRN
Start: 2015-09-01 — End: 2015-10-04

## 2015-09-01 MED ORDER — IOHEXOL 300 MG/ML  SOLN
100.0000 mL | Freq: Once | INTRAMUSCULAR | Status: AC | PRN
  Administered 2015-09-01: 100 mL via INTRAVENOUS

## 2015-09-01 MED ORDER — HYDROCODONE-ACETAMINOPHEN 5-325 MG PO TABS: 1.0000 | ORAL_TABLET | ORAL | Status: DC | PRN
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
## 2015-09-01 NOTE — ED Notes (Signed)
4 mg morphine given by [facility] at [place] - This did not help with the pts pain.
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
## 2015-09-11 ENCOUNTER — Emergency Department (HOSPITAL_COMMUNITY)
Admission: EM | Admit: 2015-09-11 | Discharge: 2015-09-11 | Disposition: A | Discharge status: Home / Self Care | Payer: Self-pay | Attending: Emergency Medicine | Admitting: Emergency Medicine

## 2015-09-11 ENCOUNTER — Encounter (HOSPITAL_COMMUNITY): Payer: Self-pay | Admitting: Emergency Medicine

## 2015-09-11 DIAGNOSIS — J45909 Unspecified asthma, uncomplicated: Secondary | ICD-10-CM | POA: Insufficient documentation

## 2015-09-11 DIAGNOSIS — S20211D Contusion of right front wall of thorax, subsequent encounter: Secondary | ICD-10-CM | POA: Insufficient documentation

## 2015-09-11 DIAGNOSIS — G8929 Other chronic pain: Secondary | ICD-10-CM | POA: Insufficient documentation

## 2015-09-11 DIAGNOSIS — F17210 Nicotine dependence, cigarettes, uncomplicated: Secondary | ICD-10-CM | POA: Insufficient documentation

## 2015-09-11 DIAGNOSIS — G43909 Migraine, unspecified, not intractable, without status migrainosus: Secondary | ICD-10-CM | POA: Insufficient documentation

## 2015-09-11 MED ORDER — CYCLOBENZAPRINE HCL 10 MG PO TABS: 10.0000 mg | ORAL_TABLET | Freq: Three times a day (TID) | ORAL | Status: DC | PRN

## 2015-09-11 MED ORDER — OXYCODONE-ACETAMINOPHEN 5-325 MG PO TABS: 1.0000 | ORAL_TABLET | ORAL | Status: DC | PRN

## 2015-09-11 MED ORDER — OXYCODONE-ACETAMINOPHEN 5-325 MG PO TABS
1.0000 | ORAL_TABLET | Freq: Once | ORAL | Status: AC
  Administered 2015-09-11: 1 via ORAL
  Filled 2015-09-11: qty 1
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
## 2015-10-04 ENCOUNTER — Emergency Department (HOSPITAL_BASED_OUTPATIENT_CLINIC_OR_DEPARTMENT_OTHER): Payer: No Typology Code available for payment source

## 2015-10-04 ENCOUNTER — Encounter (HOSPITAL_BASED_OUTPATIENT_CLINIC_OR_DEPARTMENT_OTHER): Payer: Self-pay

## 2015-10-04 ENCOUNTER — Observation Stay (HOSPITAL_BASED_OUTPATIENT_CLINIC_OR_DEPARTMENT_OTHER)
Admission: EM | Admit: 2015-10-04 | Discharge: 2015-10-06 | Disposition: A | Discharge status: Home / Self Care | Payer: No Typology Code available for payment source | Attending: General Surgery | Admitting: General Surgery

## 2015-10-04 DIAGNOSIS — N390 Urinary tract infection, site not specified: Secondary | ICD-10-CM

## 2015-10-04 DIAGNOSIS — S36021A Major contusion of spleen, initial encounter: Secondary | ICD-10-CM | POA: Diagnosis not present

## 2015-10-04 DIAGNOSIS — S32009A Unspecified fracture of unspecified lumbar vertebra, initial encounter for closed fracture: Secondary | ICD-10-CM

## 2015-10-04 DIAGNOSIS — F439 Reaction to severe stress, unspecified: Secondary | ICD-10-CM | POA: Diagnosis not present

## 2015-10-04 DIAGNOSIS — Z23 Encounter for immunization: Secondary | ICD-10-CM | POA: Insufficient documentation

## 2015-10-04 DIAGNOSIS — S2231XS Fracture of one rib, right side, sequela: Secondary | ICD-10-CM

## 2015-10-04 DIAGNOSIS — F17210 Nicotine dependence, cigarettes, uncomplicated: Secondary | ICD-10-CM | POA: Insufficient documentation

## 2015-10-04 DIAGNOSIS — N946 Dysmenorrhea, unspecified: Secondary | ICD-10-CM | POA: Insufficient documentation

## 2015-10-04 DIAGNOSIS — S32039A Unspecified fracture of third lumbar vertebra, initial encounter for closed fracture: Secondary | ICD-10-CM | POA: Diagnosis not present

## 2015-10-04 DIAGNOSIS — S32049A Unspecified fracture of fourth lumbar vertebra, initial encounter for closed fracture: Secondary | ICD-10-CM | POA: Diagnosis not present

## 2015-10-04 DIAGNOSIS — S36039A Unspecified laceration of spleen, initial encounter: Secondary | ICD-10-CM

## 2015-10-04 DIAGNOSIS — S36032A Major laceration of spleen, initial encounter: Principal | ICD-10-CM | POA: Insufficient documentation

## 2015-10-04 DIAGNOSIS — S32059A Unspecified fracture of fifth lumbar vertebra, initial encounter for closed fracture: Secondary | ICD-10-CM | POA: Diagnosis not present

## 2015-10-04 DIAGNOSIS — Y92410 Unspecified street and highway as the place of occurrence of the external cause: Secondary | ICD-10-CM | POA: Diagnosis not present

## 2015-10-04 DIAGNOSIS — S2241XA Multiple fractures of ribs, right side, initial encounter for closed fracture: Secondary | ICD-10-CM | POA: Insufficient documentation

## 2015-10-04 DIAGNOSIS — Y900 Blood alcohol level of less than 20 mg/100 ml: Secondary | ICD-10-CM | POA: Insufficient documentation

## 2015-10-04 DIAGNOSIS — D62 Acute posthemorrhagic anemia: Secondary | ICD-10-CM | POA: Diagnosis not present

## 2015-10-04 DIAGNOSIS — M542 Cervicalgia: Secondary | ICD-10-CM | POA: Diagnosis present

## 2015-10-04 LAB — URINALYSIS, ROUTINE W REFLEX MICROSCOPIC
Bilirubin Urine: NEGATIVE
Glucose, UA: NEGATIVE mg/dL
Ketones, ur: NEGATIVE mg/dL
NITRITE: POSITIVE — AB
PROTEIN: 30 mg/dL — AB
SPECIFIC GRAVITY, URINE: 1.008 (ref 1.005–1.030)
pH: 6 (ref 5.0–8.0)

## 2015-10-04 LAB — CBC WITH DIFFERENTIAL/PLATELET
BASOS ABS: 0.1 10*3/uL (ref 0.0–0.1)
BASOS PCT: 1 %
EOS ABS: 0.2 10*3/uL (ref 0.0–0.7)
Eosinophils Relative: 2 %
HCT: 38.2 % (ref 36.0–46.0)
HEMOGLOBIN: 12.5 g/dL (ref 12.0–15.0)
Lymphocytes Relative: 21 %
Lymphs Abs: 2 10*3/uL (ref 0.7–4.0)
MCH: 33.1 pg (ref 26.0–34.0)
MCHC: 32.7 g/dL (ref 30.0–36.0)
MCV: 101.1 fL — ABNORMAL HIGH (ref 78.0–100.0)
MONOS PCT: 6 %
Monocytes Absolute: 0.6 10*3/uL (ref 0.1–1.0)
NEUTROS ABS: 6.9 10*3/uL (ref 1.7–7.7)
NEUTROS PCT: 70 %
Platelets: 244 10*3/uL (ref 150–400)
RBC: 3.78 MIL/uL — ABNORMAL LOW (ref 3.87–5.11)
RDW: 12.6 % (ref 11.5–15.5)
WBC: 9.7 10*3/uL (ref 4.0–10.5)

## 2015-10-04 LAB — URINE MICROSCOPIC-ADD ON

## 2015-10-04 LAB — PREGNANCY, URINE: PREG TEST UR: NEGATIVE

## 2015-10-04 LAB — BASIC METABOLIC PANEL
ANION GAP: 7 (ref 5–15)
BUN: 12 mg/dL (ref 6–20)
CALCIUM: 9.1 mg/dL (ref 8.9–10.3)
CO2: 24 mmol/L (ref 22–32)
CREATININE: 0.62 mg/dL (ref 0.44–1.00)
Chloride: 107 mmol/L (ref 101–111)
Glucose, Bld: 138 mg/dL — ABNORMAL HIGH (ref 65–99)
Potassium: 3.3 mmol/L — ABNORMAL LOW (ref 3.5–5.1)
SODIUM: 138 mmol/L (ref 135–145)

## 2015-10-04 LAB — RAPID URINE DRUG SCREEN, HOSP PERFORMED
Amphetamines: NOT DETECTED
BENZODIAZEPINES: POSITIVE — AB
Barbiturates: NOT DETECTED
COCAINE: NOT DETECTED
OPIATES: POSITIVE — AB
Tetrahydrocannabinol: NOT DETECTED

## 2015-10-04 LAB — ETHANOL

## 2015-10-04 MED ORDER — ONDANSETRON HCL 4 MG/2ML IJ SOLN
4.0000 mg | Freq: Once | INTRAMUSCULAR | Status: AC
  Administered 2015-10-04: 4 mg via INTRAVENOUS
  Filled 2015-10-04: qty 2

## 2015-10-04 MED ORDER — TRAMADOL HCL 50 MG PO TABS: 50.0000 mg | ORAL_TABLET | Freq: Four times a day (QID) | ORAL | Status: DC | PRN

## 2015-10-04 MED ORDER — CYCLOBENZAPRINE HCL 10 MG PO TABS: 10.0000 mg | ORAL_TABLET | Freq: Three times a day (TID) | ORAL | Status: DC | PRN

## 2015-10-04 MED ORDER — IOHEXOL 300 MG/ML  SOLN
100.0000 mL | Freq: Once | INTRAMUSCULAR | Status: AC | PRN
  Administered 2015-10-04: 100 mL via INTRAVENOUS

## 2015-10-04 MED ORDER — FENTANYL CITRATE (PF) 100 MCG/2ML IJ SOLN
50.0000 ug | Freq: Once | INTRAMUSCULAR | Status: AC
  Administered 2015-10-04: 50 ug via INTRAVENOUS
  Filled 2015-10-04: qty 2

## 2015-10-04 NOTE — ED Notes (Signed)
Pt told EMS her parents would come pick her up from the ED.
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
## 2015-10-05 ENCOUNTER — Encounter (HOSPITAL_COMMUNITY): Payer: Self-pay | Admitting: *Deleted

## 2015-10-05 LAB — CBC
HCT: 33.8 % — ABNORMAL LOW (ref 36.0–46.0)
Hemoglobin: 10.8 g/dL — ABNORMAL LOW (ref 12.0–15.0)
MCH: 32.1 pg (ref 26.0–34.0)
MCHC: 32 g/dL (ref 30.0–36.0)
MCV: 100.6 fL — ABNORMAL HIGH (ref 78.0–100.0)
Platelets: 207 10*3/uL (ref 150–400)
RBC: 3.36 MIL/uL — ABNORMAL LOW (ref 3.87–5.11)
RDW: 13.3 % (ref 11.5–15.5)
WBC: 9.3 10*3/uL (ref 4.0–10.5)

## 2015-10-05 LAB — BASIC METABOLIC PANEL
Anion gap: 8 (ref 5–15)
BUN: 7 mg/dL (ref 6–20)
CHLORIDE: 106 mmol/L (ref 101–111)
CO2: 26 mmol/L (ref 22–32)
CREATININE: 0.71 mg/dL (ref 0.44–1.00)
Calcium: 8.6 mg/dL — ABNORMAL LOW (ref 8.9–10.3)
GFR calc non Af Amer: >60 mL/min (ref >60)
Glucose, Bld: 89 mg/dL (ref 65–99)
POTASSIUM: 3.8 mmol/L (ref 3.5–5.1)
Sodium: 140 mmol/L (ref 135–145)

## 2015-10-05 LAB — MRSA PCR SCREENING: MRSA BY PCR: NEGATIVE

## 2015-10-05 MED ORDER — BISACODYL 10 MG RE SUPP: 10.0000 mg | Freq: Every day | RECTAL | Status: DC | PRN

## 2015-10-05 MED ORDER — ONDANSETRON HCL 4 MG PO TABS: 4.0000 mg | ORAL_TABLET | Freq: Four times a day (QID) | ORAL | Status: DC | PRN

## 2015-10-05 MED ORDER — TRAMADOL HCL 50 MG PO TABS
50.0000 mg | ORAL_TABLET | Freq: Four times a day (QID) | ORAL | Status: DC | PRN
  Filled 2015-10-05: qty 1

## 2015-10-05 MED ORDER — INFLUENZA VAC SPLIT QUAD 0.5 ML IM SUSY
0.5000 mL | PREFILLED_SYRINGE | INTRAMUSCULAR | Status: AC
  Administered 2015-10-06: 0.5 mL via INTRAMUSCULAR
  Filled 2015-10-05: qty 0.5

## 2015-10-05 MED ORDER — DOCUSATE SODIUM 100 MG PO CAPS
100.0000 mg | ORAL_CAPSULE | Freq: Two times a day (BID) | ORAL | Status: DC
  Administered 2015-10-05 – 2015-10-06 (×2): 100 mg via ORAL
  Filled 2015-10-05 (×3): qty 1

## 2015-10-05 MED ORDER — SODIUM CHLORIDE 0.9 % IV SOLN
INTRAVENOUS | Status: AC
  Administered 2015-10-05: 01:00:00 via INTRAVENOUS

## 2015-10-05 MED ORDER — HYDROMORPHONE HCL 1 MG/ML IJ SOLN
1.0000 mg | INTRAMUSCULAR | Status: DC | PRN
  Administered 2015-10-05 – 2015-10-06 (×5): 1 mg via INTRAVENOUS
  Filled 2015-10-05 (×5): qty 1

## 2015-10-05 MED ORDER — OXYCODONE HCL 5 MG PO TABS
10.0000 mg | ORAL_TABLET | ORAL | Status: DC | PRN
  Administered 2015-10-05: 15 mg via ORAL
  Administered 2015-10-06: 10 mg via ORAL
  Administered 2015-10-06 (×2): 15 mg via ORAL
  Filled 2015-10-05 (×2): qty 3
  Filled 2015-10-05: qty 2
  Filled 2015-10-05: qty 3

## 2015-10-05 MED ORDER — SODIUM CHLORIDE 0.9 % IV SOLN
INTRAVENOUS | Status: DC
  Administered 2015-10-05 – 2015-10-06 (×2): via INTRAVENOUS

## 2015-10-05 MED ORDER — ONDANSETRON HCL 4 MG/2ML IJ SOLN
4.0000 mg | Freq: Four times a day (QID) | INTRAMUSCULAR | Status: DC | PRN
  Administered 2015-10-05: 4 mg via INTRAVENOUS
  Filled 2015-10-05: qty 2

## 2015-10-05 MED ORDER — HYDROMORPHONE HCL 1 MG/ML IJ SOLN
0.5000 mg | INTRAMUSCULAR | Status: AC | PRN
  Administered 2015-10-05: 1 mg via INTRAVENOUS
  Filled 2015-10-05: qty 1

## 2015-10-05 MED ORDER — ONDANSETRON HCL 4 MG/2ML IJ SOLN: 4.0000 mg | Freq: Three times a day (TID) | INTRAMUSCULAR | Status: DC | PRN

## 2015-10-05 MED ORDER — HYDROMORPHONE HCL 1 MG/ML IJ SOLN
1.0000 mg | INTRAMUSCULAR | Status: DC | PRN
  Administered 2015-10-05: 1 mg via INTRAVENOUS
  Filled 2015-10-05: qty 1

## 2015-10-05 NOTE — Progress Notes (Signed)
Patient gives permission for staff to give medical information to her brother Cathleen Corti.
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
## 2015-10-05 NOTE — ED Notes (Signed)
Pt requesting to go outside and smoke. Informed patient that I could not allow her to go out to smoke but I was willing to ask the doctor for a nicotine patch. Pt declined offer.
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
## 2015-10-05 NOTE — Progress Notes (Signed)
Patient has started a small amount of bleeding. Patient reports that she is not on her period and was concerned. MD was notified of concern. No orders given at this time. Will continue to monitor.

## 2015-10-05 NOTE — ED Notes (Signed)
Assumed primary care of patient from [place] = pt resting quietly at this time, no distress. HPD at bedside. Daughter at side. VSS. Call bell within reach. Awaiting Trauma Consult with Transfer.
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
## 2015-10-06 LAB — CBC
HCT: 33.3 % — ABNORMAL LOW (ref 36.0–46.0)
HEMOGLOBIN: 10.6 g/dL — AB (ref 12.0–15.0)
MCH: 32.1 pg (ref 26.0–34.0)
MCHC: 31.8 g/dL (ref 30.0–36.0)
MCV: 100.9 fL — AB (ref 78.0–100.0)
Platelets: 188 10*3/uL (ref 150–400)
RBC: 3.3 MIL/uL — AB (ref 3.87–5.11)
RDW: 13.2 % (ref 11.5–15.5)
WBC: 7.5 10*3/uL (ref 4.0–10.5)

## 2015-10-06 MED ORDER — DOCUSATE SODIUM 100 MG PO CAPS: 100.0000 mg | ORAL_CAPSULE | Freq: Two times a day (BID) | ORAL | Status: DC | PRN

## 2015-10-06 MED ORDER — METHOCARBAMOL 500 MG PO TABS
1000.0000 mg | ORAL_TABLET | Freq: Three times a day (TID) | ORAL | Status: DC | PRN
  Administered 2015-10-06: 1000 mg via ORAL
  Filled 2015-10-06: qty 2

## 2015-10-06 MED ORDER — OXYCODONE HCL 10 MG PO TABS: 10.0000 mg | ORAL_TABLET | Freq: Four times a day (QID) | ORAL | Status: DC | PRN

## 2015-10-06 MED ORDER — METHOCARBAMOL 500 MG PO TABS: 1000.0000 mg | ORAL_TABLET | Freq: Three times a day (TID) | ORAL | Status: DC | PRN

## 2015-10-06 MED FILL — Fentanyl Citrate Preservative Free (PF) Inj 100 MCG/2ML: INTRAMUSCULAR | Qty: 2 | Status: AC

## 2015-10-06 NOTE — Discharge Instructions (Signed)
Acute Stress Reaction Acute stress reaction is an unusually severe reaction to a stressful life event, such as the loss of a job or physical illness. The event may be any stressful event other than the loss of a loved one. Acute stress reaction may affect your feelings, your thinking, how you act, or a combination of these. It may interfere with personal relationships or with the way you are at work, school, or home. People with this disorder are at risk for suicide and substance abuse. They may develop a more serious mental disorder, such as major depressive disorder or post-traumatic stress disorder. SIGNS AND SYMPTOMS  Symptoms may include:  Sadness, depressed mood, or crying spells.  Loss of enjoyment.  Change in appetite or weight.  Sense of loss or hopelessness.  Thoughts of suicide.  Anxiety, worry, or nervousness.  Trouble sleeping.  Avoiding family and friends.  Poor school performance.  Fighting or vandalism.  Reckless driving.  Skipping school.  Poor work Systems analyst.  Ignoring bills. Symptoms of adjustment disorder start within 3 months of the stressful life event. They do not last more than 6 months after the event has ended. DIAGNOSIS  To make a diagnosis, your health care provider will ask about what has happened in your life and how it has affected you. He or she may also ask about your medical history and use of medicines, alcohol, and other substances. Your health care provider may do a physical exam and order lab tests or other studies. You may be referred to a mental health specialist for evaluation. TREATMENT  Treatment options include:  Counseling or talk therapy. Talk therapy is usually provided by mental health specialists.  Medicine. Certain medicines may help with depression, anxiety, and sleep.  Support groups. Support groups offer emotional support, advice, and guidance. They are made up of people who have had similar experiences. HOME CARE  INSTRUCTIONS  Keep all follow-up visits as directed by your health care provider. This is important.  Take medicines only as directed by your health care provider. SEEK MEDICAL CARE IF:  Your symptoms get worse.  SEEK IMMEDIATE MEDICAL CARE IF: You have serious thoughts about hurting yourself or someone else. MAKE SURE YOU:  Understand these instructions.  Will watch your condition.  Will get help right away if you are not doing well or get worse.   This information is not intended to replace advice given to you by your health care provider. Make sure you discuss any questions you have with your health care provider.   Document Released: 05/24/2006 Document Revised: 10/10/2014 Document Reviewed: 02/11/2014 Elsevier Interactive Patient Education 2016 Elsevier Inc[employer] Tobacco Use Disorder Tobacco use disorder (TUD) is a mental disorder. It is the long-term use of tobacco in spite of related health problems or difficulty with normal life activities. Tobacco is most commonly smoked as cigarettes and less commonly as cigars or pipes. Smokeless chewing tobacco and snuff are also popular. People with TUD get a feeling of extreme pleasure (euphoria) from using tobacco and have a desire to use it again and again. Repeated use of tobacco can cause problems. The addictive effects of tobacco are due mainly tothe ingredient nicotine. Nicotine also causes a rush of adrenaline (epinephrine) in the body. This leads to increased blood pressure, heart rate, and breathing rate. These changes may cause problems for people with high blood pressure, weak hearts, or lung disease. High doses of nicotine in children and pets can lead to seizures and death.  Tobacco contains  a number of other unsafe chemicals. These chemicals are especially harmful when inhaled as smoke and can damage almost every organ in the body. Smokers live shorter lives than nonsmokers and are at risk of dying from a number of diseases and  cancers. Tobacco smoke can also cause health problems for nonsmokers (due to inhaling secondhand smoke). Smoking is also a fire hazard.  TUD usually starts in the late teenage years and is most common in young adults between the ages of 65 and 47 years. People who start smoking earlier in life are more likely to continue smoking as adults. TUD is somewhat more common in men than women. People with TUD are at higher risk for using alcohol and other drugs of abuse. RISK FACTORS Risk factors for TUD include:   Having family members with the disorder.  Being around people who use tobacco.  Having an existing mental health issue such as schizophrenia, depression, bipolar disorder, ADHD, or posttraumatic stress disorder (PTSD). SIGNS AND SYMPTOMS  People with tobacco use disorder have two or more of the following signs and symptoms within 12 months:   Use of more tobacco over a longer period than intended.   Not able to cut down or control tobacco use.   A lot of time spent obtaining or using tobacco.   Strong desire or urge to use tobacco (craving). Cravings may last for 6 months or longer after quitting.  Use of tobacco even when use leads to major problems at work, school, or home.   Use of tobacco even when use leads to relationship problems.   Giving up or cutting down on important life activities because of tobacco use.   Repeatedly using tobacco in situations where it puts you or others in physical danger, like smoking in bed.   Use of tobacco even when it is known that a physical or mental problem is likely related to tobacco use.   Physical problems are numerous and may include chronic bronchitis, emphysema, lung and other cancers, gum disease, high blood pressure, heart disease, and stroke.   Mental problems caused by tobacco may include difficulty sleeping and anxiety.  Need to use greater amounts of tobacco to get the same effect. This means you have developed a  tolerance.   Withdrawal symptoms as a result of stopping or rapidly cutting back use. These symptoms may last a month or more after quitting and include the following:   Depressed, anxious, or irritable mood.   Difficulty concentrating.   Increased appetite.  Restlessness or trouble sleeping.   Use of tobacco to avoid withdrawal symptoms. DIAGNOSIS  Tobacco use disorder is diagnosed by your health care provider. A diagnosis may be made by:  Your health care provider asking questions about your tobacco use and any problems it may be causing.  A physical exam.  Lab tests.  You may be referred to a mental health professional or addiction specialist. The severity of tobacco use disorder depends on the number of signs and symptoms you have:   Mild--Two or three symptoms.  Moderate--Four or five symptoms.   Severe--Six or more symptoms.  TREATMENT  Many people with tobacco use disorder are unable to quit on their own and need help. Treatment options include the following:  Nicotine replacement therapy (NRT). NRT provides nicotine without the other harmful chemicals in tobacco. NRT gradually lowers the dosage of nicotine in the body and reduces withdrawal symptoms. NRT is available in over-the-counter forms (gum, lozenges, and skin patches) as well  as prescription forms (mouth inhaler and nasal spray).  Medicines.This may include:  Antidepressant medicine that may reduce nicotine cravings.  A medicine that acts on nicotine receptors in the brain to reduce cravings and withdrawal symptoms. It may also block the effects of tobacco in people with TUD who relapse.  Counseling or talk therapy. A form of talk therapy called behavioral therapy is commonly used to treat people with TUD. Behavioral therapy looks at triggers for tobacco use, how to avoid them, and how to cope with cravings. It is most effective in person or by phone but is also available in self-help forms (books  and Internet websites).  Support groups. These provide emotional support, advice, and guidance for quitting tobacco. The most effective treatment for TUD is usually a combination of medicine, talk therapy, and support groups. HOME CARE INSTRUCTIONS  Keep all follow-up visits as directed by your health care provider. This is important.  Take medicines only as directed by your health care provider.  Check with your health care provider before starting new prescription or over-the-counter medicines. SEEK MEDICAL CARE IF:  You are not able to take your medicines as prescribed.  Treatment is not helping your TUD and your symptoms get worse. SEEK IMMEDIATE MEDICAL CARE IF:  You have serious thoughts about hurting yourself or others.  You have trouble breathing, chest pain, sudden weakness, or sudden numbness in part of your body.   This information is not intended to replace advice given to you by your health care provider. Make sure you discuss any questions you have with your health care provider.   Document Released: 05/25/2004 Document Revised: 10/10/2014 Document Reviewed: 11/15/2013 Elsevier Interactive Patient Education 2016 Elsevier Inc.   Splenic Injury A splenic injury is an injury of the spleen. The spleen is an organ located in the upper left area of your abdomen, just under your ribs. Your spleen filters and cleans your blood. It also stores blood cells and destroys cells that are worn out. Your spleen is also important for fighting disease.  Splenic injuries can vary. In some cases, the spleen may only be bruised with some bleeding inside the covering and around the spleen. Splenic injuries may also cause a deep tear or cut into the spleen (lacerated spleen). Some splenic injuries can cause the spleen to break open (rupture). CAUSES  Splenic injuries can be caused by a direct blow (blunt trauma) from:  Car accidents.  Contact sports.  Falls. Gunshot wounds or knife  wounds (penetrating injuries) can also cause a splenic injury. RISK FACTORS You may be at greater risk for a splenic injury if you have a disease that can cause the spleen to become enlarged. These include:  Alcoholic liver disease.  Viral infections, especially mononucleosis. SIGNS AND SYMPTOMS  A minor splenic injury often causes no symptoms or only minor abdominal pain. If the injury causes severe bleeding, your blood pressure may rapidly decrease. This may cause:  Dizziness or light-headedness.  Rapid heart rate.  Difficulty breathing.  Fainting.  Sweating with clammy skin. Other signs and symptoms of a splenic injury can include:  Very bad abdominal pain.  Pain in the left shoulder.  Pain when the abdomen is pressed (tenderness).  Nausea.  Swelling or bruising of the abdomen. DIAGNOSIS  Your health care provider may suspect a splenic injury based on your signs and symptoms, especially if you were recently in an accident or you recently got hurt. Your health care provider will do a physical exam. Imaging  tests may be done to confirm the diagnosis. These may include:  Ultrasound.  CT scan. You may have frequent blood tests for a few days after the injury to monitor your condition.  TREATMENT  Treatment depends on the type of splenic injury you have and how bad it is. Your health care provider will develop a treatment plan specific to your needs.  Less severe injuries may be treated with:  Observation.  Interventional radiology. This involves using flexible tubes (catheters) to stop the bleeding from inside the blood vessel.   More severe injuries may require hospitalization in the intensive care unit (ICU). While you are in the ICU:   Your fluid and blood levels will be monitored closely.  You will get fluids through an IV tube as needed.   You may need follow-up scans to check whether your spleen is able to heal itself. If the injury is getting worse, you  may need surgery.  You may receive donated blood (transfusion).  You may have a long needle inserted into your abdomen to remove any blood that has collected inside the spleen (hematoma).  Surgery. If your blood pressure is too low, you may need emergency surgery. This may include:  Repairing a laceration.  Removing part of the spleen.  Removing the entire spleen (splenectomy). HOME CARE INSTRUCTIONS  Take medicines only as directed by your health care provider.  Rest at home.  Do not participate in any strenuous activity until your health care provider says it is safe to do so.  Do not lift anything that is heavier than 10 lb (4.5 kg).  Do not participate in contact sports until your health care provider says it is safe to do so. SEEK MEDICAL CARE IF:  You have a fever.  You have new or increasing pain in your abdomen or in your left shoulder. SEEK IMMEDIATE MEDICAL CARE IF:  You have signs or symptoms of internal bleeding. Watch for:  Sweating.  Dizziness.  Weakness.  Cold and clammy skin.  Fainting.  You have chest pain or difficulty breathing.   This information is not intended to replace advice given to you by your health care provider. Make sure you discuss any questions you have with your health care provider.   Document Released: 07/11/2006 Document Revised: 10/10/2014 Document Reviewed: 06/04/2014 Elsevier Interactive Patient Education 2016 Elsevier Inc.   Sternal Fracture The sternum is the bone in the center of the front of your chest which your ribs attach to. It is also called the breastbone. The most common cause of a sternal fracture (break in the bone) is an injury. The most common injury is from a motor vehicle accident. The fracture often comes from the seat belt or hitting the chest on the steering wheel or being forcibly bent forward (shoulders toward your knees) during an accident. It is more common in females and the elderly. The fracture of  the sternum is usually not a problem if there are no other injuries. Other injuries that may happen are to the ribs, heart, lungs, and abdominal organs. SYMPTOMS  Common complaints from a fracture of the sternum include:  Shortness of breath.  Pain with breathing or difficulty breathing.  Bruises about the chest.  Tenderness or a cracking sound at the breastbone. DIAGNOSIS  Your caregiver may be able to tell if the sternum is broken by examining you. Other times studies such as X-ray, CAT scan, ultrasound, and nuclear medicine are used to detect a fracture.  TREATMENT  Sternal fractures usually are not serious and if displacement is minimal, no treatment is necessary.  The main concern is with damage to the surrounding structures: ribs, heart, great vessels coming from the heart, and the backbone in the chest area.  Multiple rib fractures may cause breathing difficulties.  Injury to one of the large vessels in the chest may be a threat to life and require immediate surgery.  If injury to the heart or lungs is suspected it may be necessary to stay in the hospital and be monitored.  Other injuries will be treated as needed.  If the pieces of the breastbone are out of normal position, they may need to be reduced (put back in position) and then wired in place or fixed with a plate and screws during an operation. HOME CARE INSTRUCTIONS   Avoid strenuous activity. Be careful during activities and avoid bumping or reinjuring the injured sternum. Activities that cause pain pull on the fracture site(s) and are best avoided if possible.  Eat a normal, well-balanced diet. Drink plenty of fluids to avoid constipation, a common side effect of pain medications.  Take deep breaths and cough several times a day, splinting the injured area with a pillow. This will help prevent pneumonia.  Do not wear a rib belt or binder for the chest unless instructed otherwise. These restrict breathing and can  lead to pneumonia.  Only take over-the-counter or prescription medicines for pain, discomfort, or fever as directed by your caregiver. SEEK MEDICAL CARE IF:  You develop a continual cough, associated with thick or bloody mucus or phlegm (sputum). SEEK IMMEDIATE MEDICAL CARE IF:   You have a fever.  You have increasing difficulty breathing.  You feel sick to your stomach (nausea), vomit, or have abdominal pain.  You have worsening pain, not controlled with medications.  You develop pain in the tops of your shoulders (in the shoulder strap area).  You feel light-headed or faint.  You develop chest pain or an abnormal heartbeat (palpitations).  You develop pain radiating into the jaw, teeth or down the arms.   This information is not intended to replace advice given to you by your health care provider. Make sure you discuss any questions you have with your health care provider.   Document Released: 05/03/2004 Document Revised: 10/10/2014 Document Reviewed: 04/15/2015 Elsevier Interactive Patient Education 2016 [place].   Rib Fracture A rib fracture is a break or crack in one of the bones of the ribs. The ribs are a group of long, curved bones that wrap around your chest and attach to your spine. They protect your lungs and other organs in the chest cavity. A broken or cracked rib is often painful, but most do not cause other problems. Most rib fractures heal on their own over time. However, rib fractures can be more serious if multiple ribs are broken or if broken ribs move out of place and push against other structures. CAUSES   A direct blow to the chest. For example, this could happen during contact sports, a car accident, or a fall against a hard object.  Repetitive movements with high force, such as pitching a baseball or having severe coughing spells. SYMPTOMS   Pain when you breathe in or cough.  Pain when someone presses on the injured area. DIAGNOSIS  Your  caregiver will perform a physical exam. Various imaging tests may be ordered to confirm the diagnosis and to look for related injuries. These tests may include a chest X-ray,  computed tomography (CT), magnetic resonance imaging (MRI), or a bone scan. TREATMENT  Rib fractures usually heal on their own in 1-3 months. The longer healing period is often associated with a continued cough or other aggravating activities. During the healing period, pain control is very important. Medication is usually given to control pain. Hospitalization or surgery may be needed for more severe injuries, such as those in which multiple ribs are broken or the ribs have moved out of place.  HOME CARE INSTRUCTIONS   Avoid strenuous activity and any activities or movements that cause pain. Be careful during activities and avoid bumping the injured rib.  Gradually increase activity as directed by your caregiver.  Only take over-the-counter or prescription medications as directed by your caregiver. Do not take other medications without asking your caregiver first.  Apply ice to the injured area for the first 1-2 days after you have been treated or as directed by your caregiver. Applying ice helps to reduce inflammation and pain.  Put ice in a plastic bag.  Place a towel between your skin and the bag.   Leave the ice on for 15-20 minutes at a time, every 2 hours while you are awake.  Perform deep breathing as directed by your caregiver. This will help prevent pneumonia, which is a common complication of a broken rib. Your caregiver may instruct you to:  Take deep breaths several times a day.  Try to cough several times a day, holding a pillow against the injured area.  Use a device called an incentive spirometer to practice deep breathing several times a day.  Drink enough fluids to keep your urine clear or pale yellow. This will help you avoid constipation.   Do not wear a rib belt or binder. These restrict  breathing, which can lead to pneumonia.  SEEK IMMEDIATE MEDICAL CARE IF:   You have a fever.   You have difficulty breathing or shortness of breath.   You develop a continual cough, or you cough up thick or bloody sputum.  You feel sick to your stomach (nausea), throw up (vomit), or have abdominal pain.   You have worsening pain not controlled with medications.  MAKE SURE YOU:  Understand these instructions.  Will watch your condition.  Will get help right away if you are not doing well or get worse.   This information is not intended to replace advice given to you by your health care provider. Make sure you discuss any questions you have with your health care provider.   Document Released: 09/19/2005 Document Revised: 05/22/2013 Document Reviewed: 11/21/2012 Elsevier Interactive Patient Education [employer].

## 2015-10-06 NOTE — Care Management Note (Signed)
Case Management Note  Patient Details  Name: Bonnie Jensen MRN: [id number] Date of Birth: 24-May-1976  Subjective/Objective:   Pt for dc home today.  Pt is uninsured, has no PCP.  Pt's boyfriend killed in the accident; Chaplain has been providing counseling while in house.                   Action/Plan: CSW consulted to provide written information on grief and loss.  Pt eligible for medication assistance through [place] letter given with explanation of program benefits.  Attempted to assist with PCP follow up:  Currently [facility] not taking any new patients.  Called [facility] to see if their provider would see pt: they were to call me back, but did not.  Called [facility]: First appt is in February, and pt will have to go to clinic to pick up packet, THEN be put on waiting list for February.  Relayed this information to pt/bedside nurse.  Advised pt to try to follow up with PCP in [place], near her home.    Expected Discharge Date:   10/06/2015               Expected Discharge Plan:  Home/Self Care  In-House Referral:  Clinical Social Work  Discharge planning Services  CM Consult, Medication Assistance, [place] Program  Post Acute Care Choice:    Choice offered to:     DME Arranged:    DME Agency:     HH Arranged:    [place] Agency:     Status of Service:  Completed, signed off  Medicare Important Message Given:    Date Medicare IM Given:    Medicare IM give by:    Date Additional Medicare IM Given:    Additional Medicare Important Message give by:     If discussed at [place] of Stay Meetings, dates discussed:    Additional Comments:  Reinaldo Raddle, RN, BSN  Trauma/Neuro ICU Case Manager [phone]
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
## 2015-10-06 NOTE — Progress Notes (Signed)
Discharge Note. Patient had multiple visitors at bedside so all were asked to step out for discharge instructions. PIV removed with no issues. Patient given Rx's, reviewed all medications with patient and how to take them. All discharge education and instructions reviewed. Patient was receptive and thanked staff for taking care of her. Patient discharged.
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
## 2015-10-07 LAB — URINE CULTURE

## 2015-10-12 NOTE — Progress Notes (Signed)
Physical Therapy Treatment  These are the appropriate G -codes for evaluation completed on 1/2 at 1343.    October 30, 2015 1400  PT G-Codes **NOT FOR INPATIENT CLASS**  Functional Assessment Tool Used clinical judgement  Functional Limitation Mobility: Walking and moving around  Mobility: Walking and Moving Around Current Status [id number]) CI  Mobility: Walking and Moving Around Goal Status [id number] CI    Kittie Plater, PT, DPT Pager #: [phone] Office #: [phone]
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
## 2017-09-18 ENCOUNTER — Emergency Department (HOSPITAL_COMMUNITY)
Admission: EM | Admit: 2017-09-18 | Discharge: 2017-09-18 | Disposition: A | Discharge status: Home / Self Care | Payer: Self-pay | Attending: Emergency Medicine | Admitting: Emergency Medicine

## 2017-09-18 ENCOUNTER — Encounter (HOSPITAL_COMMUNITY): Payer: Self-pay | Admitting: *Deleted

## 2017-09-18 ENCOUNTER — Emergency Department (HOSPITAL_COMMUNITY): Payer: Self-pay

## 2017-09-18 ENCOUNTER — Other Ambulatory Visit: Payer: Self-pay

## 2017-09-18 DIAGNOSIS — R072 Precordial pain: Secondary | ICD-10-CM | POA: Insufficient documentation

## 2017-09-18 DIAGNOSIS — J45909 Unspecified asthma, uncomplicated: Secondary | ICD-10-CM | POA: Insufficient documentation

## 2017-09-18 DIAGNOSIS — F17210 Nicotine dependence, cigarettes, uncomplicated: Secondary | ICD-10-CM | POA: Insufficient documentation

## 2017-09-18 LAB — BASIC METABOLIC PANEL
ANION GAP: 9 (ref 5–15)
BUN: 10 mg/dL (ref 6–20)
CALCIUM: 9.4 mg/dL (ref 8.9–10.3)
CO2: 22 mmol/L (ref 22–32)
Chloride: 104 mmol/L (ref 101–111)
Creatinine, Ser: 0.78 mg/dL (ref 0.44–1.00)
Glucose, Bld: 93 mg/dL (ref 65–99)
POTASSIUM: 3.7 mmol/L (ref 3.5–5.1)
Sodium: 135 mmol/L (ref 135–145)

## 2017-09-18 LAB — CBC
HCT: 40.4 % (ref 36.0–46.0)
HEMOGLOBIN: 13.1 g/dL (ref 12.0–15.0)
MCH: 30.2 pg (ref 26.0–34.0)
MCHC: 32.4 g/dL (ref 30.0–36.0)
MCV: 93.1 fL (ref 78.0–100.0)
Platelets: 347 10*3/uL (ref 150–400)
RBC: 4.34 MIL/uL (ref 3.87–5.11)
RDW: 15.9 % — ABNORMAL HIGH (ref 11.5–15.5)
WBC: 8.4 10*3/uL (ref 4.0–10.5)

## 2017-09-18 LAB — I-STAT TROPONIN, ED: Troponin i, poc: 0 ng/mL (ref 0.00–0.08)

## 2017-09-18 LAB — TROPONIN I

## 2017-09-18 LAB — D-DIMER, QUANTITATIVE (NOT AT ARMC)

## 2017-09-18 MED ORDER — MORPHINE SULFATE (PF) 4 MG/ML IV SOLN
4.0000 mg | Freq: Once | INTRAVENOUS | Status: AC
  Administered 2017-09-18: 4 mg via INTRAVENOUS
  Filled 2017-09-18: qty 1

## 2017-09-18 MED ORDER — ONDANSETRON HCL 4 MG/2ML IJ SOLN
4.0000 mg | Freq: Once | INTRAMUSCULAR | Status: AC
  Administered 2017-09-18: 4 mg via INTRAVENOUS
  Filled 2017-09-18: qty 2

## 2017-09-18 MED ORDER — SODIUM CHLORIDE 0.9 % IV BOLUS (SEPSIS)
500.0000 mL | Freq: Once | INTRAVENOUS | Status: AC
  Administered 2017-09-18: 500 mL via INTRAVENOUS
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
## 2017-09-18 NOTE — ED Notes (Signed)
Advised patient not to drive after discharge due to narcotic medication administration. Patient verbalized understanding. Discharged with daughter to drive her home.

## 2017-09-18 NOTE — Discharge Instructions (Signed)

## 2017-09-18 NOTE — ED Triage Notes (Signed)
Pt c/o left sided chest pain under the left breast, right arm pain, some SOB, nausea, vomiting, dizziness since Saturday.

## 2017-09-18 NOTE — ED Notes (Signed)
Gave patient and family member sprite as requested prior to discharge.

## 2017-09-20 ENCOUNTER — Encounter (HOSPITAL_COMMUNITY): Payer: Self-pay | Admitting: Emergency Medicine

## 2017-09-20 ENCOUNTER — Emergency Department (HOSPITAL_COMMUNITY)
Admission: EM | Admit: 2017-09-20 | Discharge: 2017-09-20 | Disposition: A | Discharge status: Home / Self Care | Payer: Self-pay | Attending: Emergency Medicine | Admitting: Emergency Medicine

## 2017-09-20 ENCOUNTER — Other Ambulatory Visit: Payer: Self-pay

## 2017-09-20 DIAGNOSIS — Z5321 Procedure and treatment not carried out due to patient leaving prior to being seen by health care provider: Secondary | ICD-10-CM | POA: Insufficient documentation

## 2017-09-20 HISTORY — DX: Benign neoplasm of brain, unspecified: D33.2

## 2017-09-20 NOTE — ED Triage Notes (Signed)
Pt seen yesterday for same. Pt states still has cp. C/o numbnes to right arm. Dizzy and feels like she is going to pass out. C/o sob also. No obvoius sob noted. Steady gait into triage room nondiaphoretic. N/v continues also. Mm moist.

## 2017-12-19 ENCOUNTER — Encounter: Payer: Self-pay | Admitting: Physician Assistant

## 2017-12-19 ENCOUNTER — Ambulatory Visit: Payer: Self-pay | Admitting: Physician Assistant

## 2017-12-19 VITALS — BP 142/80 | HR 90 | Temp 97.7°F | Ht 60.25 in | Wt 203.2 lb

## 2017-12-19 DIAGNOSIS — E669 Obesity, unspecified: Secondary | ICD-10-CM

## 2017-12-19 DIAGNOSIS — F419 Anxiety disorder, unspecified: Secondary | ICD-10-CM

## 2017-12-19 DIAGNOSIS — Z131 Encounter for screening for diabetes mellitus: Secondary | ICD-10-CM

## 2017-12-19 DIAGNOSIS — R030 Elevated blood-pressure reading, without diagnosis of hypertension: Secondary | ICD-10-CM

## 2017-12-19 DIAGNOSIS — Z7689 Persons encountering health services in other specified circumstances: Secondary | ICD-10-CM

## 2017-12-19 DIAGNOSIS — Z13220 Encounter for screening for lipoid disorders: Secondary | ICD-10-CM

## 2017-12-19 DIAGNOSIS — F17210 Nicotine dependence, cigarettes, uncomplicated: Secondary | ICD-10-CM

## 2017-12-19 MED ORDER — CITALOPRAM HYDROBROMIDE 20 MG PO TABS: 20.0000 mg | ORAL_TABLET | Freq: Every day | ORAL | 0 refills | Status: DC
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
## 2017-12-19 NOTE — Patient Instructions (Signed)
Coping with Quitting Smoking Quitting smoking is a physical and mental challenge. You will face cravings, withdrawal symptoms, and temptation. Before quitting, work with your health care provider to make a plan that can help you cope. Preparation can help you quit and keep you from giving in. How can I cope with cravings? Cravings usually last for 5-10 minutes. If you get through it, the craving will pass. Consider taking the following actions to help you cope with cravings:  Keep your mouth busy: ? Chew sugar-free gum. ? Suck on hard candies or a straw. ? Brush your teeth.  Keep your hands and body busy: ? Immediately change to a different activity when you feel a craving. ? Squeeze or play with a ball. ? Do an activity or a hobby, like making bead jewelry, practicing needlepoint, or working with wood. ? Mix up your normal routine. ? Take a short exercise break. Go for a quick walk or run up and down stairs. ? Spend time in public places where smoking is not allowed.  Focus on doing something kind or helpful for someone else.  Call a friend or family member to talk during a craving.  Join a support group.  Call a quit line, such as 1-800-QUIT-NOW.  Talk with your health care provider about medicines that might help you cope with cravings and make quitting easier for you.  How can I deal with withdrawal symptoms? Your body may experience negative effects as it tries to get used to not having nicotine in the system. These effects are called withdrawal symptoms. They may include:  Feeling hungrier than normal.  Trouble concentrating.  Irritability.  Trouble sleeping.  Feeling depressed.  Restlessness and agitation.  Craving a cigarette.  To manage withdrawal symptoms:  Avoid places, people, and activities that trigger your cravings.  Remember why you want to quit.  Get plenty of sleep.  Avoid coffee and other caffeinated drinks. These may worsen some of your  symptoms.  How can I handle social situations? Social situations can be difficult when you are quitting smoking, especially in the first few weeks. To manage this, you can:  Avoid parties, bars, and other social situations where people might be smoking.  Avoid alcohol.  Leave right away if you have the urge to smoke.  Explain to your family and friends that you are quitting smoking. Ask for understanding and support.  Plan activities with friends or family where smoking is not an option.  What are some ways I can cope with stress? Wanting to smoke may cause stress, and stress can make you want to smoke. Find ways to manage your stress. Relaxation techniques can help. For example:  Breathe slowly and deeply, in through your nose and out through your mouth.  Listen to soothing, relaxing music.  Talk with a family member or friend about your stress.  Light a candle.  Soak in a bath or take a shower.  Think about a peaceful place.  What are some ways I can prevent weight gain? Be aware that many people gain weight after they quit smoking. However, not everyone does. To keep from gaining weight, have a plan in place before you quit and stick to the plan after you quit. Your plan should include:  Having healthy snacks. When you have a craving, it may help to: ? Eat plain popcorn, crunchy carrots, celery, or other cut vegetables. ? Chew sugar-free gum.  Changing how you eat: ? Eat small portion sizes at meals. ?   Eat 4-6 small meals throughout the day instead of 1-2 large meals a day. ? Be mindful when you eat. Do not watch television or do other things that might distract you as you eat.  Exercising regularly: ? Make time to exercise each day. If you do not have time for a long workout, do short bouts of exercise for 5-10 minutes several times a day. ? Do some form of strengthening exercise, like weight lifting, and some form of aerobic exercise, like running or  swimming.  Drinking plenty of water or other low-calorie or no-calorie drinks. Drink 6-8 glasses of water daily, or as much as instructed by your health care provider.  Summary  Quitting smoking is a physical and mental challenge. You will face cravings, withdrawal symptoms, and temptation to smoke again. Preparation can help you as you go through these challenges.  You can cope with cravings by keeping your mouth busy (such as by chewing gum), keeping your body and hands busy, and making calls to family, friends, or a helpline for people who want to quit smoking.  You can cope with withdrawal symptoms by avoiding places where people smoke, avoiding drinks with caffeine, and getting plenty of rest.  Ask your health care provider about the different ways to prevent weight gain, avoid stress, and handle social situations. This information is not intended to replace advice given to you by your health care provider. Make sure you discuss any questions you have with your health care provider. Document Released: 09/16/2016 Document Revised: 09/16/2016 Document Reviewed: 09/16/2016 Elsevier Interactive Patient Education  2018 [employer]  

## 2017-12-20 ENCOUNTER — Other Ambulatory Visit (HOSPITAL_COMMUNITY)
Admission: RE | Admit: 2017-12-20 | Discharge: 2017-12-20 | Disposition: A | Discharge status: Home / Self Care | Payer: Self-pay | Source: Ambulatory Visit | Attending: Physician Assistant | Admitting: Physician Assistant

## 2017-12-20 DIAGNOSIS — R030 Elevated blood-pressure reading, without diagnosis of hypertension: Secondary | ICD-10-CM | POA: Insufficient documentation

## 2017-12-20 DIAGNOSIS — Z131 Encounter for screening for diabetes mellitus: Secondary | ICD-10-CM | POA: Insufficient documentation

## 2017-12-20 DIAGNOSIS — F419 Anxiety disorder, unspecified: Secondary | ICD-10-CM | POA: Insufficient documentation

## 2017-12-20 DIAGNOSIS — Z13220 Encounter for screening for lipoid disorders: Secondary | ICD-10-CM | POA: Insufficient documentation

## 2017-12-20 LAB — COMPREHENSIVE METABOLIC PANEL
ALT: 26 U/L (ref 14–54)
AST: 23 U/L (ref 15–41)
Albumin: 4.2 g/dL (ref 3.5–5.0)
Alkaline Phosphatase: 68 U/L (ref 38–126)
Anion gap: 10 (ref 5–15)
BUN: 12 mg/dL (ref 6–20)
CHLORIDE: 103 mmol/L (ref 101–111)
CO2: 22 mmol/L (ref 22–32)
CREATININE: 0.78 mg/dL (ref 0.44–1.00)
Calcium: 9.4 mg/dL (ref 8.9–10.3)
GFR calc Af Amer: >60 mL/min (ref >60)
GFR calc non Af Amer: >60 mL/min (ref >60)
Glucose, Bld: 107 mg/dL — ABNORMAL HIGH (ref 65–99)
Potassium: 4.1 mmol/L (ref 3.5–5.1)
SODIUM: 135 mmol/L (ref 135–145)
Total Bilirubin: 0.8 mg/dL (ref 0.3–1.2)
Total Protein: 8.1 g/dL (ref 6.5–8.1)

## 2017-12-20 LAB — TSH: TSH: 2.446 u[IU]/mL (ref 0.350–4.500)

## 2017-12-20 LAB — LIPID PANEL
CHOL/HDL RATIO: 4.1 ratio
Cholesterol: 206 mg/dL — ABNORMAL HIGH (ref 0–200)
HDL: 50 mg/dL (ref >40)
LDL CALC: 119 mg/dL — AB (ref 0–99)
Triglycerides: 183 mg/dL — ABNORMAL HIGH (ref <150)
VLDL: 37 mg/dL (ref 0–40)

## 2017-12-20 LAB — HEMOGLOBIN A1C
Hgb A1c MFr Bld: 5.7 % — ABNORMAL HIGH (ref 4.8–5.6)
MEAN PLASMA GLUCOSE: 116.89 mg/dL

## 2018-01-01 ENCOUNTER — Encounter: Payer: Self-pay | Admitting: Physician Assistant

## 2018-01-10 ENCOUNTER — Encounter: Payer: Self-pay | Admitting: Physician Assistant

## 2018-01-10 ENCOUNTER — Ambulatory Visit: Payer: Self-pay | Admitting: Physician Assistant

## 2018-01-10 VITALS — BP 158/88 | HR 80 | Temp 97.7°F | Ht 60.25 in | Wt 212.5 lb

## 2018-01-10 DIAGNOSIS — F17210 Nicotine dependence, cigarettes, uncomplicated: Secondary | ICD-10-CM

## 2018-01-10 DIAGNOSIS — E785 Hyperlipidemia, unspecified: Secondary | ICD-10-CM

## 2018-01-10 DIAGNOSIS — F32A Depression, unspecified: Secondary | ICD-10-CM

## 2018-01-10 DIAGNOSIS — I10 Essential (primary) hypertension: Secondary | ICD-10-CM

## 2018-01-10 DIAGNOSIS — G8929 Other chronic pain: Secondary | ICD-10-CM

## 2018-01-10 DIAGNOSIS — F329 Major depressive disorder, single episode, unspecified: Secondary | ICD-10-CM

## 2018-01-10 DIAGNOSIS — M5441 Lumbago with sciatica, right side: Secondary | ICD-10-CM

## 2018-01-10 DIAGNOSIS — F419 Anxiety disorder, unspecified: Secondary | ICD-10-CM

## 2018-01-10 MED ORDER — LISINOPRIL 10 MG PO TABS: 10.0000 mg | ORAL_TABLET | Freq: Every day | ORAL | 1 refills | Status: DC

## 2018-01-10 MED ORDER — CITALOPRAM HYDROBROMIDE 20 MG PO TABS: 20.0000 mg | ORAL_TABLET | Freq: Every day | ORAL | 0 refills | Status: DC

## 2018-01-10 NOTE — Progress Notes (Signed)
BP (!) 158/88 (BP Location: Left Arm, Patient Position: Sitting, Cuff Size: Normal)   Pulse 80   Temp 97.7 F (36.5 C)   Ht 5' 0.25" (1.53 m)   Wt 212 lb 8 oz (96.4 kg)   SpO2 95%   BMI 41.16 kg/m    Subjective:    Patient ID: Bonnie Jensen, female    DOB: July 08, 1976, 42 y.o.   MRN: [id number]  HPI: Bonnie Jensen is a 42 y.o. female presenting on 01/10/2018 for Mental Health Problem   HPI Pt [facility]'t go to [facility] as recommended at last OV.   She says she doesn't feel like the citalopram has helped any.   Pt complains of chronic back pain.  No recent changes.  States radiated down left leg.   Relevant past medical, surgical, family and social history reviewed and updated as indicated. Interim medical history since our last visit reviewed. Allergies and medications reviewed and updated.   Current Outpatient Medications:  .  citalopram (CELEXA) 20 MG tablet, Take 1 tablet (20 mg total) by mouth daily., Disp: 30 tablet, Rfl: 0   Review of Systems  Constitutional: Positive for appetite change, diaphoresis and fatigue. Negative for chills, fever and unexpected weight change.  HENT: Negative for congestion, drooling, ear pain, facial swelling, hearing loss, mouth sores, sneezing, sore throat, trouble swallowing and voice change.   Eyes: Negative for pain, discharge, redness, itching and visual disturbance.  Respiratory: Positive for shortness of breath and wheezing. Negative for cough and choking.   Cardiovascular: Positive for chest pain. Negative for palpitations and leg swelling.  Gastrointestinal: Negative for abdominal pain, blood in stool, constipation, diarrhea and vomiting.  Endocrine: Positive for heat intolerance and polydipsia. Negative for cold intolerance.  Genitourinary: Negative for decreased urine volume, dysuria and hematuria.  Musculoskeletal: Positive for back pain. Negative for arthralgias and gait problem.  Skin: Negative for rash.  Allergic/Immunologic:  Negative for environmental allergies.  Neurological: Negative for seizures, syncope, light-headedness and headaches.  Hematological: Negative for adenopathy.  Psychiatric/Behavioral: Positive for agitation. Negative for dysphoric mood and suicidal ideas. The patient is nervous/anxious.     Per HPI unless specifically indicated above     Objective:    BP (!) 158/88 (BP Location: Left Arm, Patient Position: Sitting, Cuff Size: Normal)   Pulse 80   Temp 97.7 F (36.5 C)   Ht 5' 0.25" (1.53 m)   Wt 212 lb 8 oz (96.4 kg)   SpO2 95%   BMI 41.16 kg/m   Wt Readings from Last 3 Encounters:  01/10/18 212 lb 8 oz (96.4 kg)  12/19/17 203 lb 4 oz (92.2 kg)  09/18/17 190 lb (86.2 kg)    Physical Exam  Constitutional: She is oriented to person, place, and time. She appears well-developed and well-nourished.  HENT:  Head: Normocephalic and atraumatic.  Neck: Neck supple.  Cardiovascular: Normal rate and regular rhythm.  Pulmonary/Chest: Effort normal and breath sounds normal.  Abdominal: Soft. Bowel sounds are normal. She exhibits no mass. There is no hepatosplenomegaly. There is no tenderness.  Musculoskeletal: She exhibits no edema.  Lymphadenopathy:    She has no cervical adenopathy.  Neurological: She is alert and oriented to person, place, and time.  Skin: Skin is warm and dry.  Psychiatric: She has a normal mood and affect. Her behavior is normal.  Vitals reviewed.   Results for orders placed or performed during the hospital encounter of 12/20/17  Hemoglobin A1c  Result Value Ref Range  Hgb A1c MFr Bld 5.7 (H) 4.8 - 5.6 %   Mean Plasma Glucose 116.89 mg/dL  Comprehensive metabolic panel  Result Value Ref Range   Sodium 135 135 - 145 mmol/L   Potassium 4.1 3.5 - 5.1 mmol/L   Chloride 103 101 - 111 mmol/L   CO2 22 22 - 32 mmol/L   Glucose, Bld 107 (H) 65 - 99 mg/dL   BUN 12 6 - 20 mg/dL   Creatinine, Ser 0.78 0.44 - 1.00 mg/dL   Calcium 9.4 8.9 - 10.3 mg/dL   Total  Protein 8.1 6.5 - 8.1 g/dL   Albumin 4.2 3.5 - 5.0 g/dL   AST 23 15 - 41 U/L   ALT 26 14 - 54 U/L   Alkaline Phosphatase 68 38 - 126 U/L   Total Bilirubin 0.8 0.3 - 1.2 mg/dL   GFR calc non Af Amer >60 >60 mL/min   GFR calc Af Amer >60 >60 mL/min   Anion gap 10 5 - 15  Lipid panel  Result Value Ref Range   Cholesterol 206 (H) 0 - 200 mg/dL   Triglycerides 183 (H) <150 mg/dL   HDL 50 >40 mg/dL   Total CHOL/HDL Ratio 4.1 RATIO   VLDL 37 0 - 40 mg/dL   LDL Cholesterol 119 (H) 0 - 99 mg/dL  TSH  Result Value Ref Range   TSH 2.446 0.350 - 4.500 uIU/mL      Assessment & Plan:   Encounter Diagnoses  Name Primary?  . Essential hypertension Yes  . Anxiety   . Depression, unspecified depression type   . Hyperlipidemia, unspecified hyperlipidemia type   . Chronic low back pain with right-sided sciatica, unspecified back pain laterality   . Morbid obesity ([place])   . Cigarette nicotine dependence without complication     -reviewed labs with pt -will rx Lisinopril for bp -Counseled low fat diet and regular exercise for lipids -pt to Continue citalopram.  Urged pt to Get [facility] for [facility] mental health issues -Xray LS spine -pt was given application for [facility] care -will follow up 1 month to recheck bp, make sure pt in at [facility] and discuss back complaints

## 2018-01-10 NOTE — Patient Instructions (Signed)
Fat and Cholesterol Restricted Diet High levels of fat and cholesterol in your blood may lead to various health problems, such as diseases of the heart, blood vessels, gallbladder, liver, and pancreas. Fats are concentrated sources of energy that come in various forms. Certain types of fat, including saturated fat, may be harmful in excess. Cholesterol is a substance needed by your body in small amounts. Your body makes all the cholesterol it needs. Excess cholesterol comes from the food you eat. When you have high levels of cholesterol and saturated fat in your blood, health problems can develop because the excess fat and cholesterol will gather along the walls of your blood vessels, causing them to narrow. Choosing the right foods will help you control your intake of fat and cholesterol. This will help keep the levels of these substances in your blood within normal limits and reduce your risk of disease. What is my plan? Your health care provider recommends that you:  Limit your fat intake to ______% or less of your total calories per day.  Limit the amount of cholesterol in your diet to less than _________mg per day.  Eat 20-30 grams of fiber each day.  What types of fat should I choose?  Choose healthy fats more often. Choose monounsaturated and polyunsaturated fats, such as olive and canola oil, flaxseeds, walnuts, almonds, and seeds.  Eat more omega-3 fats. Good choices include salmon, mackerel, sardines, tuna, flaxseed oil, and ground flaxseeds. Aim to eat fish at least two times a week.  Limit saturated fats. Saturated fats are primarily found in animal products, such as meats, butter, and cream. Plant sources of saturated fats include palm oil, palm kernel oil, and coconut oil.  Avoid foods with partially hydrogenated oils in them. These contain trans fats. Examples of foods that contain trans fats are stick margarine, some tub margarines, cookies, crackers, and other baked goods. What  general guidelines do I need to follow? These guidelines for healthy eating will help you control your intake of fat and cholesterol:  Check food labels carefully to identify foods with trans fats or high amounts of saturated fat.  Fill one half of your plate with vegetables and green salads.  Fill one fourth of your plate with whole grains. Look for the word "whole" as the first word in the ingredient list.  Fill one fourth of your plate with lean protein foods.  Limit fruit to two servings a day. Choose fruit instead of juice.  Eat more foods that contain fiber, such as apples, broccoli, carrots, beans, peas, and barley.  Eat more home-cooked food and less restaurant, buffet, and fast food.  Limit or avoid alcohol.  Limit foods high in starch and sugar.  Limit fried foods.  Cook foods using methods other than frying. Baking, boiling, grilling, and broiling are all great options.  Lose weight if you are overweight. Losing just 5-10% of your initial body weight can help your overall health and prevent diseases such as diabetes and heart disease.  What foods can I eat? Grains  Whole grains, such as whole wheat or whole grain breads, crackers, cereals, and pasta. Unsweetened oatmeal, bulgur, barley, quinoa, or brown rice. Corn or whole wheat flour tortillas. Vegetables  Fresh or frozen vegetables (raw, steamed, roasted, or grilled). Green salads. Fruits  All fresh, canned (in natural juice), or frozen fruits. Meats and other protein foods  Ground beef (85% or leaner), grass-fed beef, or beef trimmed of fat. Skinless chicken or turkey. Ground chicken or turkey.   Pork trimmed of fat. All fish and seafood. Eggs. Dried beans, peas, or lentils. Unsalted nuts or seeds. Unsalted canned or dry beans. Dairy  Low-fat dairy products, such as skim or 1% milk, 2% or reduced-fat cheeses, low-fat ricotta or cottage cheese, or plain low-fat yo Fats and oils  Tub margarines without trans  fats. Light or reduced-fat mayonnaise and salad dressings. Avocado. Olive, canola, sesame, or safflower oils. Natural peanut or almond butter (choose ones without added sugar and oil). The items listed above may not be a complete list of recommended foods or beverages. Contact your dietitian for more options. Foods to avoid Grains  White bread. White pasta. White rice. Cornbread. Bagels, pastries, and croissants. Crackers that contain trans fat. Vegetables  White potatoes. Corn. Creamed or fried vegetables. Vegetables in a cheese sauce. Fruits  Dried fruits. Canned fruit in light or heavy syrup. Fruit juice. Meats and other protein foods  Fatty cuts of meat. Ribs, chicken wings, bacon, sausage, bologna, salami, chitterlings, fatback, hot dogs, bratwurst, and packaged luncheon meats. Liver and organ meats. Dairy  Whole or 2% milk, cream, half-and-half, and cream cheese. Whole milk cheeses. Whole-fat or sweetened yogurt. Full-fat cheeses. Nondairy creamers and whipped toppings. Processed cheese, cheese spreads, or cheese curds. Beverages  Alcohol. Sweetened drinks (such as sodas, lemonade, and fruit drinks or punches). Fats and oils  Butter, stick margarine, lard, shortening, ghee, or bacon fat. Coconut, palm kernel, or palm oils. Sweets and desserts  Corn syrup, sugars, honey, and molasses. Candy. Jam and jelly. Syrup. Sweetened cereals. Cookies, pies, cakes, donuts, muffins, and ice cream. The items listed above may not be a complete list of foods and beverages to avoid. Contact your dietitian for more information. This information is not intended to replace advice given to you by your health care provider. Make sure you discuss any questions you have with your health care provider. Document Released: 09/19/2005 Document Revised: 10/10/2014 Document Reviewed: 12/18/2013 Elsevier Interactive Patient Education  2018 Elsevier Inc.  

## 2018-01-22 ENCOUNTER — Ambulatory Visit (HOSPITAL_COMMUNITY)
Admission: RE | Admit: 2018-01-22 | Discharge: 2018-01-22 | Disposition: A | Discharge status: Home / Self Care | Payer: Self-pay | Source: Ambulatory Visit | Attending: Physician Assistant | Admitting: Physician Assistant

## 2018-01-22 DIAGNOSIS — G8929 Other chronic pain: Secondary | ICD-10-CM | POA: Insufficient documentation

## 2018-01-22 DIAGNOSIS — M5441 Lumbago with sciatica, right side: Secondary | ICD-10-CM | POA: Insufficient documentation

## 2018-02-08 ENCOUNTER — Ambulatory Visit: Payer: Self-pay | Admitting: Physician Assistant

## 2018-02-08 ENCOUNTER — Encounter: Payer: Self-pay | Admitting: Physician Assistant

## 2018-02-08 VITALS — BP 110/78 | HR 90 | Temp 98.1°F | Ht 60.25 in | Wt 209.0 lb

## 2018-02-08 DIAGNOSIS — F17210 Nicotine dependence, cigarettes, uncomplicated: Secondary | ICD-10-CM

## 2018-02-08 DIAGNOSIS — F419 Anxiety disorder, unspecified: Secondary | ICD-10-CM

## 2018-02-08 DIAGNOSIS — M5441 Lumbago with sciatica, right side: Secondary | ICD-10-CM

## 2018-02-08 DIAGNOSIS — I10 Essential (primary) hypertension: Secondary | ICD-10-CM

## 2018-02-08 DIAGNOSIS — F329 Major depressive disorder, single episode, unspecified: Secondary | ICD-10-CM

## 2018-02-08 DIAGNOSIS — G8929 Other chronic pain: Secondary | ICD-10-CM

## 2018-02-08 DIAGNOSIS — F32A Depression, unspecified: Secondary | ICD-10-CM

## 2018-02-08 MED ORDER — PREDNISONE 10 MG PO TABS: ORAL_TABLET | ORAL | 0 refills | Status: AC

## 2018-02-08 MED ORDER — LISINOPRIL 10 MG PO TABS: 10.0000 mg | ORAL_TABLET | Freq: Every day | ORAL | 1 refills | Status: DC

## 2018-02-08 NOTE — Progress Notes (Signed)
BP 110/78 (BP Location: Left Arm, Patient Position: Sitting, Cuff Size: Normal)   Pulse 90   Temp 98.1 F (36.7 C)   Ht 5' 0.25" (1.53 m)   Wt 209 lb (94.8 kg)   SpO2 97%   BMI 40.48 kg/m    Subjective:    Patient ID: Bonnie Jensen, female    DOB: 1976-06-20, 42 y.o.   MRN: [id number]  HPI: Bonnie Jensen is a 42 y.o. female presenting on 02/08/2018 for Hypertension and Back Pain   HPI  Pt has still not gone to [facility] as recommended at [facility] 12/19/17.  she is planning to go to [facility] when she leaves here today.  She still hasn't noticed the citalopram helping any.   Pt says low back is still hurting.  She turned in her charity care application same day she did xray (01/22/18).  She says pain goes into hip and into upper thigh.  She says the pain goes up into her neck also.   She has been on prednisone in the past and tolerates it.  Reviewed results back xray with pt  Her chest pains have gone away.  Relevant past medical, surgical, family and social history reviewed and updated as indicated. Interim medical history since our last visit reviewed. Allergies and medications reviewed and updated.   Current Outpatient Medications:  .  citalopram (CELEXA) 20 MG tablet, Take 1 tablet (20 mg total) by mouth daily., Disp: 30 tablet, Rfl: 0 .  lisinopril (PRINIVIL,ZESTRIL) 10 MG tablet, Take 1 tablet (10 mg total) by mouth daily., Disp: 30 tablet, Rfl: 1   Review of Systems  Constitutional: Positive for appetite change, diaphoresis and fatigue. Negative for chills, fever and unexpected weight change.  HENT: Negative for congestion, drooling, ear pain, facial swelling, hearing loss, mouth sores, sneezing, sore throat, trouble swallowing and voice change.   Eyes: Negative for pain, discharge, redness, itching and visual disturbance.  Respiratory: Positive for cough, shortness of breath and wheezing. Negative for choking.   Cardiovascular: Positive for leg swelling. Negative for chest pain  and palpitations.  Gastrointestinal: Negative for abdominal pain, blood in stool, constipation, diarrhea and vomiting.  Endocrine: Positive for heat intolerance and polydipsia. Negative for cold intolerance.  Genitourinary: Negative for decreased urine volume, dysuria and hematuria.  Musculoskeletal: Positive for back pain and gait problem. Negative for arthralgias.  Skin: Negative for rash.  Allergic/Immunologic: Negative for environmental allergies.  Neurological: Negative for seizures, syncope, light-headedness and headaches.  Hematological: Negative for adenopathy.  Psychiatric/Behavioral: Positive for agitation. Negative for dysphoric mood and suicidal ideas. The patient is nervous/anxious.     Per HPI unless specifically indicated above     Objective:    BP 110/78 (BP Location: Left Arm, Patient Position: Sitting, Cuff Size: Normal)   Pulse 90   Temp 98.1 F (36.7 C)   Ht 5' 0.25" (1.53 m)   Wt 209 lb (94.8 kg)   SpO2 97%   BMI 40.48 kg/m   Wt Readings from Last 3 Encounters:  02/08/18 209 lb (94.8 kg)  01/10/18 212 lb 8 oz (96.4 kg)  12/19/17 203 lb 4 oz (92.2 kg)    Physical Exam  Constitutional: She is oriented to person, place, and time. She appears well-developed and well-nourished.  HENT:  Head: Normocephalic and atraumatic.  Neck: Neck supple.  Cardiovascular: Normal rate and regular rhythm.  Pulmonary/Chest: Effort normal and breath sounds normal.  Abdominal: Soft. Bowel sounds are normal. She exhibits no mass. There is no  hepatosplenomegaly. There is no tenderness.  Musculoskeletal: She exhibits no edema.       Lumbar back: She exhibits tenderness. She exhibits normal range of motion, no bony tenderness, no swelling and no deformity.  Diffuse low back tenderness   Lymphadenopathy:    She has no cervical adenopathy.  Neurological: She is alert and oriented to person, place, and time.  Skin: Skin is warm and dry.  Psychiatric: She has a normal mood and  affect. Her behavior is normal.  Vitals reviewed.       Assessment & Plan:   Encounter Diagnoses  Name Primary?  . Essential hypertension Yes  . Anxiety   . Depression, unspecified depression type   . Chronic low back pain with right-sided sciatica, unspecified back pain laterality   . Morbid obesity ([place])   . Cigarette nicotine dependence without complication     -pt encouraged to get to [facility] for [place] issues -rx prednisone for back pain.  -Refer to orthopedics for the back pain -pt to Continue lisinopril for blood pressure -pt to follow up in 3 weeks to update PAP.  RTO sooner prn

## 2018-03-01 ENCOUNTER — Ambulatory Visit: Payer: Self-pay | Admitting: Physician Assistant

## 2018-03-07 ENCOUNTER — Ambulatory Visit: Payer: Self-pay | Admitting: Physician Assistant

## 2018-03-07 ENCOUNTER — Encounter: Payer: Self-pay | Admitting: Physician Assistant

## 2018-03-07 ENCOUNTER — Other Ambulatory Visit (HOSPITAL_COMMUNITY)
Admission: RE | Admit: 2018-03-07 | Discharge: 2018-03-07 | Disposition: A | Discharge status: Home / Self Care | Payer: Self-pay | Source: Ambulatory Visit | Attending: Physician Assistant | Admitting: Physician Assistant

## 2018-03-07 VITALS — BP 136/84 | HR 92 | Temp 97.7°F | Ht 60.25 in | Wt 206.5 lb

## 2018-03-07 DIAGNOSIS — M5441 Lumbago with sciatica, right side: Secondary | ICD-10-CM

## 2018-03-07 DIAGNOSIS — E785 Hyperlipidemia, unspecified: Secondary | ICD-10-CM

## 2018-03-07 DIAGNOSIS — Z124 Encounter for screening for malignant neoplasm of cervix: Secondary | ICD-10-CM

## 2018-03-07 DIAGNOSIS — I10 Essential (primary) hypertension: Secondary | ICD-10-CM

## 2018-03-07 DIAGNOSIS — F17210 Nicotine dependence, cigarettes, uncomplicated: Secondary | ICD-10-CM

## 2018-03-07 DIAGNOSIS — G8929 Other chronic pain: Secondary | ICD-10-CM

## 2018-03-07 NOTE — Patient Instructions (Signed)
Financial counselor- [phone]   

## 2018-03-07 NOTE — Progress Notes (Signed)
BP 136/84 (BP Location: Left Arm, Patient Position: Sitting, Cuff Size: Normal)   Pulse 92   Temp 97.7 F (36.5 C)   Ht 5' 0.25" (1.53 m)   Wt 206 lb 8 oz (93.7 kg)   LMP 02/25/2018 (Exact Date)   SpO2 97%   BMI 40.00 kg/m    Subjective:    Patient ID: Bonnie Jensen, female    DOB: 1976/07/08, 42 y.o.   MRN: [id number]  HPI: Bonnie Jensen is a 42 y.o. female presenting on 03/07/2018 for Gynecologic Exam   HPI   She hasn't been to [facility].  She stopped the citalopram.  She "is controlling it" (her anxiety and depression) with walking and golf.   She says she turned in her charity care application  She says back improving some with weight loss  Relevant past medical, surgical, family and social history reviewed and updated as indicated. Interim medical history since our last visit reviewed. Allergies and medications reviewed and updated.   Current Outpatient Medications:  .  lisinopril (PRINIVIL,ZESTRIL) 10 MG tablet, Take 1 tablet (10 mg total) by mouth daily., Disp: 30 tablet, Rfl: 1   Review of Systems  Constitutional: Positive for diaphoresis and fatigue. Negative for appetite change, chills, fever and unexpected weight change.  HENT: Negative for congestion, drooling, ear pain, facial swelling, hearing loss, mouth sores, sneezing, sore throat, trouble swallowing and voice change.   Eyes: Negative for pain, discharge, redness, itching and visual disturbance.  Respiratory: Positive for cough, chest tightness, shortness of breath and wheezing. Negative for choking.   Cardiovascular: Negative for chest pain, palpitations and leg swelling.  Gastrointestinal: Negative for abdominal pain, blood in stool, constipation, diarrhea and vomiting.  Endocrine: Positive for heat intolerance and polydipsia. Negative for cold intolerance.  Genitourinary: Negative for decreased urine volume, dysuria and hematuria.  Musculoskeletal: Negative for arthralgias, back pain and gait problem.   Skin: Negative for rash.  Allergic/Immunologic: Negative for environmental allergies.  Neurological: Positive for headaches. Negative for seizures, syncope and light-headedness.  Hematological: Negative for adenopathy.  Psychiatric/Behavioral: Negative for agitation, dysphoric mood and suicidal ideas. The patient is not nervous/anxious.     Per HPI unless specifically indicated above     Objective:    BP 136/84 (BP Location: Left Arm, Patient Position: Sitting, Cuff Size: Normal)   Pulse 92   Temp 97.7 F (36.5 C)   Ht 5' 0.25" (1.53 m)   Wt 206 lb 8 oz (93.7 kg)   LMP 02/25/2018 (Exact Date)   SpO2 97%   BMI 40.00 kg/m   Wt Readings from Last 3 Encounters:  03/07/18 206 lb 8 oz (93.7 kg)  02/08/18 209 lb (94.8 kg)  01/10/18 212 lb 8 oz (96.4 kg)    Physical Exam  Constitutional: She is oriented to person, place, and time. She appears well-developed and well-nourished.  HENT:  Head: Normocephalic and atraumatic.  Neck: Neck supple.  Cardiovascular: Normal rate and regular rhythm.  Pulmonary/Chest: Effort normal and breath sounds normal. No breast tenderness, discharge or bleeding.  Breast exam normal  Abdominal: Soft. Bowel sounds are normal. She exhibits no mass. There is no hepatosplenomegaly. There is no tenderness. There is no rebound and no guarding.  Genitourinary: Vagina normal and uterus normal. There is no rash, tenderness or lesion on the right labia. There is no rash, tenderness or lesion on the left labia. Cervix exhibits no motion tenderness, no discharge and no friability. Right adnexum displays no mass, no tenderness and no  fullness. Left adnexum displays no mass, no tenderness and no fullness.  Genitourinary Comments: (nurse Berenice assisted)  Musculoskeletal: She exhibits no edema.  Lymphadenopathy:    She has no cervical adenopathy.  Neurological: She is alert and oriented to person, place, and time.  Skin: Skin is warm and dry.  Psychiatric: She has a  normal mood and affect. Her behavior is normal.  Nursing note and vitals reviewed.       Assessment & Plan:    Encounter Diagnoses  Name Primary?  . Routine Papanicolaou smear Yes  . Chronic low back pain with right-sided sciatica, unspecified back pain laterality   . Essential hypertension   . Morbid obesity ([place])      -counseled pt to continue lisinopril for blood pressure -pt was given phone number for financial counselor so she can check on her [facility] [employer] application -pt to follow up in 3 months.  RTO sooner prn

## 2018-03-15 ENCOUNTER — Encounter: Payer: Self-pay | Admitting: Physician Assistant

## 2018-06-07 ENCOUNTER — Ambulatory Visit: Payer: Self-pay | Admitting: Physician Assistant

## 2018-06-13 ENCOUNTER — Ambulatory Visit: Payer: Self-pay | Admitting: Physician Assistant

## 2018-06-18 ENCOUNTER — Encounter: Payer: Self-pay | Admitting: Physician Assistant

## 2018-07-17 ENCOUNTER — Other Ambulatory Visit: Payer: Self-pay

## 2018-07-17 ENCOUNTER — Emergency Department (HOSPITAL_COMMUNITY)
Admission: EM | Admit: 2018-07-17 | Discharge: 2018-07-18 | Disposition: A | Discharge status: Home / Self Care | Payer: Self-pay | Attending: Emergency Medicine | Admitting: Emergency Medicine

## 2018-07-17 ENCOUNTER — Encounter (HOSPITAL_COMMUNITY): Payer: Self-pay | Admitting: *Deleted

## 2018-07-17 DIAGNOSIS — R1084 Generalized abdominal pain: Secondary | ICD-10-CM | POA: Insufficient documentation

## 2018-07-17 DIAGNOSIS — Z79899 Other long term (current) drug therapy: Secondary | ICD-10-CM | POA: Insufficient documentation

## 2018-07-17 DIAGNOSIS — R197 Diarrhea, unspecified: Secondary | ICD-10-CM | POA: Insufficient documentation

## 2018-07-17 DIAGNOSIS — F17210 Nicotine dependence, cigarettes, uncomplicated: Secondary | ICD-10-CM | POA: Insufficient documentation

## 2018-07-17 DIAGNOSIS — J45909 Unspecified asthma, uncomplicated: Secondary | ICD-10-CM | POA: Insufficient documentation

## 2018-07-17 DIAGNOSIS — R112 Nausea with vomiting, unspecified: Secondary | ICD-10-CM | POA: Insufficient documentation

## 2018-07-17 MED ORDER — ONDANSETRON HCL 4 MG/2ML IJ SOLN
4.0000 mg | Freq: Once | INTRAMUSCULAR | Status: AC
  Administered 2018-07-18: 4 mg via INTRAVENOUS
  Filled 2018-07-17: qty 2

## 2018-07-17 MED ORDER — FENTANYL CITRATE (PF) 100 MCG/2ML IJ SOLN
50.0000 ug | Freq: Once | INTRAMUSCULAR | Status: AC
  Administered 2018-07-18: 50 ug via INTRAVENOUS
  Filled 2018-07-17: qty 2

## 2018-07-17 MED ORDER — SODIUM CHLORIDE 0.9 % IV BOLUS (SEPSIS)
1000.0000 mL | Freq: Once | INTRAVENOUS | Status: AC
  Administered 2018-07-18: 1000 mL via INTRAVENOUS
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
## 2018-07-17 NOTE — ED Triage Notes (Signed)
Pt states she has been vomiting x 3 days with abdominal pain that started yesterday; pt has been having diarrhea also and loss of appetite

## 2018-07-18 LAB — URINALYSIS, ROUTINE W REFLEX MICROSCOPIC
Bilirubin Urine: NEGATIVE
GLUCOSE, UA: NEGATIVE mg/dL
Ketones, ur: NEGATIVE mg/dL
Leukocytes, UA: NEGATIVE
Nitrite: NEGATIVE
PH: 5 (ref 5.0–8.0)
Protein, ur: NEGATIVE mg/dL
Specific Gravity, Urine: 1.011 (ref 1.005–1.030)

## 2018-07-18 LAB — CBC WITH DIFFERENTIAL/PLATELET
ABS IMMATURE GRANULOCYTES: 0.02 10*3/uL (ref 0.00–0.07)
BASOS PCT: 1 %
Basophils Absolute: 0.1 10*3/uL (ref 0.0–0.1)
EOS PCT: 2 %
Eosinophils Absolute: 0.2 10*3/uL (ref 0.0–0.5)
HCT: 43.2 % (ref 36.0–46.0)
Hemoglobin: 14.2 g/dL (ref 12.0–15.0)
Immature Granulocytes: 0 %
Lymphocytes Relative: 31 %
Lymphs Abs: 2.8 10*3/uL (ref 0.7–4.0)
MCH: 31.8 pg (ref 26.0–34.0)
MCHC: 32.9 g/dL (ref 30.0–36.0)
MCV: 96.6 fL (ref 80.0–100.0)
MONO ABS: 0.6 10*3/uL (ref 0.1–1.0)
Monocytes Relative: 6 %
NEUTROS ABS: 5.5 10*3/uL (ref 1.7–7.7)
Neutrophils Relative %: 60 %
PLATELETS: 354 10*3/uL (ref 150–400)
RBC: 4.47 MIL/uL (ref 3.87–5.11)
RDW: 13.3 % (ref 11.5–15.5)
WBC: 9.2 10*3/uL (ref 4.0–10.5)
nRBC: 0 % (ref 0.0–0.2)

## 2018-07-18 LAB — BASIC METABOLIC PANEL
Anion gap: 6 (ref 5–15)
BUN: 13 mg/dL (ref 6–20)
CALCIUM: 8.8 mg/dL — AB (ref 8.9–10.3)
CO2: 23 mmol/L (ref 22–32)
Chloride: 118 mmol/L — ABNORMAL HIGH (ref 98–111)
Creatinine, Ser: 0.85 mg/dL (ref 0.44–1.00)
GFR calc Af Amer: >60 mL/min (ref >60)
Glucose, Bld: 86 mg/dL (ref 70–99)
POTASSIUM: 3.6 mmol/L (ref 3.5–5.1)
SODIUM: 147 mmol/L — AB (ref 135–145)

## 2018-07-18 LAB — POC URINE PREG, ED: PREG TEST UR: NEGATIVE

## 2018-07-18 MED ORDER — SODIUM CHLORIDE 0.9 % IV BOLUS (SEPSIS): 1000.0000 mL | Freq: Once | INTRAVENOUS | Status: DC

## 2018-07-18 MED ORDER — LOPERAMIDE HCL 2 MG PO CAPS
4.0000 mg | ORAL_CAPSULE | Freq: Once | ORAL | Status: AC
  Administered 2018-07-18: 4 mg via ORAL
  Filled 2018-07-18: qty 2

## 2018-07-18 MED ORDER — FENTANYL CITRATE (PF) 100 MCG/2ML IJ SOLN
50.0000 ug | Freq: Once | INTRAMUSCULAR | Status: AC
  Administered 2018-07-18: 50 ug via INTRAVENOUS
  Filled 2018-07-18: qty 2

## 2018-07-18 MED ORDER — HYDROCODONE-ACETAMINOPHEN 5-325 MG PO TABS
1.0000 | ORAL_TABLET | Freq: Once | ORAL | Status: AC
  Administered 2018-07-18: 1 via ORAL
  Filled 2018-07-18: qty 1

## 2018-07-18 NOTE — ED Notes (Signed)
Pt given warm blanket.

## 2018-07-18 NOTE — Discharge Instructions (Addendum)
°  SEEK IMMEDIATE MEDICAL ATTENTION IF: °The pain does not go away or becomes severe, particularly over the next 8-12 hours.  °A temperature above 100.4F develops.  °Repeated vomiting occurs (multiple episodes).  ° °Blood is being passed in stools or vomit (bright red or black tarry stools).  °Return also if you develop chest pain, difficulty breathing, dizziness or fainting, or become confused, poorly responsive, or inconsolable. ° °

## 2021-10-03 DIAGNOSIS — R69 Illness, unspecified: Secondary | ICD-10-CM | POA: Diagnosis not present

## 2021-10-04 DIAGNOSIS — R69 Illness, unspecified: Secondary | ICD-10-CM | POA: Diagnosis not present

## 2021-10-05 DIAGNOSIS — R69 Illness, unspecified: Secondary | ICD-10-CM | POA: Diagnosis not present

## 2021-10-06 DIAGNOSIS — R69 Illness, unspecified: Secondary | ICD-10-CM | POA: Diagnosis not present

## 2021-10-07 DIAGNOSIS — R69 Illness, unspecified: Secondary | ICD-10-CM | POA: Diagnosis not present

## 2021-10-08 DIAGNOSIS — R69 Illness, unspecified: Secondary | ICD-10-CM | POA: Diagnosis not present

## 2021-10-09 DIAGNOSIS — R69 Illness, unspecified: Secondary | ICD-10-CM | POA: Diagnosis not present

## 2021-10-10 DIAGNOSIS — R69 Illness, unspecified: Secondary | ICD-10-CM | POA: Diagnosis not present

## 2021-10-11 DIAGNOSIS — R69 Illness, unspecified: Secondary | ICD-10-CM | POA: Diagnosis not present

## 2021-10-12 DIAGNOSIS — R69 Illness, unspecified: Secondary | ICD-10-CM | POA: Diagnosis not present

## 2021-10-13 DIAGNOSIS — R69 Illness, unspecified: Secondary | ICD-10-CM | POA: Diagnosis not present

## 2021-10-14 DIAGNOSIS — R69 Illness, unspecified: Secondary | ICD-10-CM | POA: Diagnosis not present

## 2021-10-15 DIAGNOSIS — R69 Illness, unspecified: Secondary | ICD-10-CM | POA: Diagnosis not present

## 2021-10-16 DIAGNOSIS — R69 Illness, unspecified: Secondary | ICD-10-CM | POA: Diagnosis not present

## 2021-10-18 DIAGNOSIS — R69 Illness, unspecified: Secondary | ICD-10-CM | POA: Diagnosis not present

## 2021-10-19 DIAGNOSIS — R69 Illness, unspecified: Secondary | ICD-10-CM | POA: Diagnosis not present

## 2021-10-20 DIAGNOSIS — R69 Illness, unspecified: Secondary | ICD-10-CM | POA: Diagnosis not present

## 2021-10-21 DIAGNOSIS — R69 Illness, unspecified: Secondary | ICD-10-CM | POA: Diagnosis not present

## 2021-10-22 DIAGNOSIS — R69 Illness, unspecified: Secondary | ICD-10-CM | POA: Diagnosis not present

## 2021-10-23 DIAGNOSIS — R69 Illness, unspecified: Secondary | ICD-10-CM | POA: Diagnosis not present

## 2021-10-24 DIAGNOSIS — R69 Illness, unspecified: Secondary | ICD-10-CM | POA: Diagnosis not present

## 2021-10-25 DIAGNOSIS — R69 Illness, unspecified: Secondary | ICD-10-CM | POA: Diagnosis not present

## 2021-10-26 DIAGNOSIS — R69 Illness, unspecified: Secondary | ICD-10-CM | POA: Diagnosis not present

## 2021-11-01 DIAGNOSIS — R69 Illness, unspecified: Secondary | ICD-10-CM | POA: Diagnosis not present

## 2021-11-05 DIAGNOSIS — R69 Illness, unspecified: Secondary | ICD-10-CM | POA: Diagnosis not present

## 2021-11-08 DIAGNOSIS — R69 Illness, unspecified: Secondary | ICD-10-CM | POA: Diagnosis not present

## 2021-11-15 DIAGNOSIS — R69 Illness, unspecified: Secondary | ICD-10-CM | POA: Diagnosis not present

## 2021-11-18 DIAGNOSIS — R69 Illness, unspecified: Secondary | ICD-10-CM | POA: Diagnosis not present

## 2021-11-22 DIAGNOSIS — R69 Illness, unspecified: Secondary | ICD-10-CM | POA: Diagnosis not present

## 2021-11-29 DIAGNOSIS — R69 Illness, unspecified: Secondary | ICD-10-CM | POA: Diagnosis not present

## 2021-12-05 DIAGNOSIS — J45909 Unspecified asthma, uncomplicated: Secondary | ICD-10-CM | POA: Diagnosis not present

## 2021-12-05 DIAGNOSIS — R0602 Shortness of breath: Secondary | ICD-10-CM | POA: Diagnosis not present

## 2021-12-05 DIAGNOSIS — R69 Illness, unspecified: Secondary | ICD-10-CM | POA: Diagnosis not present

## 2021-12-05 DIAGNOSIS — Z885 Allergy status to narcotic agent status: Secondary | ICD-10-CM | POA: Diagnosis not present

## 2021-12-05 DIAGNOSIS — J208 Acute bronchitis due to other specified organisms: Secondary | ICD-10-CM | POA: Diagnosis not present

## 2021-12-05 DIAGNOSIS — Z888 Allergy status to other drugs, medicaments and biological substances status: Secondary | ICD-10-CM | POA: Diagnosis not present

## 2021-12-05 DIAGNOSIS — R059 Cough, unspecified: Secondary | ICD-10-CM | POA: Diagnosis not present

## 2021-12-05 DIAGNOSIS — Z20822 Contact with and (suspected) exposure to covid-19: Secondary | ICD-10-CM | POA: Diagnosis not present

## 2021-12-06 DIAGNOSIS — R69 Illness, unspecified: Secondary | ICD-10-CM | POA: Diagnosis not present

## 2023-11-29 ENCOUNTER — Emergency Department (HOSPITAL_COMMUNITY)
Admission: EM | Admit: 2023-11-29 | Discharge: 2023-11-29 | Disposition: A | Discharge status: Home / Self Care | Payer: No Typology Code available for payment source | Attending: Emergency Medicine | Admitting: Emergency Medicine

## 2023-11-29 ENCOUNTER — Emergency Department (HOSPITAL_COMMUNITY): Payer: No Typology Code available for payment source

## 2023-11-29 ENCOUNTER — Encounter (HOSPITAL_COMMUNITY): Payer: Self-pay | Admitting: *Deleted

## 2023-11-29 ENCOUNTER — Other Ambulatory Visit: Payer: Self-pay

## 2023-11-29 DIAGNOSIS — J45909 Unspecified asthma, uncomplicated: Secondary | ICD-10-CM | POA: Insufficient documentation

## 2023-11-29 DIAGNOSIS — R7989 Other specified abnormal findings of blood chemistry: Secondary | ICD-10-CM | POA: Insufficient documentation

## 2023-11-29 DIAGNOSIS — R112 Nausea with vomiting, unspecified: Secondary | ICD-10-CM | POA: Insufficient documentation

## 2023-11-29 DIAGNOSIS — J449 Chronic obstructive pulmonary disease, unspecified: Secondary | ICD-10-CM | POA: Diagnosis not present

## 2023-11-29 DIAGNOSIS — F17210 Nicotine dependence, cigarettes, uncomplicated: Secondary | ICD-10-CM | POA: Diagnosis not present

## 2023-11-29 DIAGNOSIS — R1030 Lower abdominal pain, unspecified: Secondary | ICD-10-CM | POA: Diagnosis present

## 2023-11-29 LAB — URINALYSIS, ROUTINE W REFLEX MICROSCOPIC
Bilirubin Urine: NEGATIVE
Glucose, UA: NEGATIVE mg/dL
Hgb urine dipstick: NEGATIVE
Ketones, ur: NEGATIVE mg/dL
Leukocytes,Ua: NEGATIVE
Nitrite: NEGATIVE
Protein, ur: NEGATIVE mg/dL
Specific Gravity, Urine: 1.012 (ref 1.005–1.030)
pH: 5 (ref 5.0–8.0)

## 2023-11-29 LAB — COMPREHENSIVE METABOLIC PANEL
ALT: 197 U/L — ABNORMAL HIGH (ref 0–44)
AST: 138 U/L — ABNORMAL HIGH (ref 15–41)
Albumin: 4.1 g/dL (ref 3.5–5.0)
Alkaline Phosphatase: 91 U/L (ref 38–126)
Anion gap: 10 (ref 5–15)
BUN: 18 mg/dL (ref 6–20)
CO2: 23 mmol/L (ref 22–32)
Calcium: 9.3 mg/dL (ref 8.9–10.3)
Chloride: 104 mmol/L (ref 98–111)
Creatinine, Ser: 0.74 mg/dL (ref 0.44–1.00)
GFR, Estimated: >60 mL/min (ref >60)
Glucose, Bld: 112 mg/dL — ABNORMAL HIGH (ref 70–99)
Potassium: 3.8 mmol/L (ref 3.5–5.1)
Sodium: 137 mmol/L (ref 135–145)
Total Bilirubin: 0.6 mg/dL (ref 0.0–1.2)
Total Protein: 7.7 g/dL (ref 6.5–8.1)

## 2023-11-29 LAB — CBC
HCT: 37.1 % (ref 36.0–46.0)
Hemoglobin: 12.6 g/dL (ref 12.0–15.0)
MCH: 30.9 pg (ref 26.0–34.0)
MCHC: 34 g/dL (ref 30.0–36.0)
MCV: 90.9 fL (ref 80.0–100.0)
Platelets: 266 10*3/uL (ref 150–400)
RBC: 4.08 MIL/uL (ref 3.87–5.11)
RDW: 13.2 % (ref 11.5–15.5)
WBC: 6.3 10*3/uL (ref 4.0–10.5)
nRBC: 0 % (ref 0.0–0.2)

## 2023-11-29 LAB — LIPASE, BLOOD: Lipase: 29 U/L (ref 11–51)

## 2023-11-29 MED ORDER — ONDANSETRON 4 MG PO TBDP: 4.0000 mg | ORAL_TABLET | Freq: Three times a day (TID) | ORAL | 0 refills | Status: DC | PRN

## 2023-11-29 MED ORDER — FAMOTIDINE IN NACL 20-0.9 MG/50ML-% IV SOLN
20.0000 mg | Freq: Once | INTRAVENOUS | Status: AC
  Administered 2023-11-29: 20 mg via INTRAVENOUS
  Filled 2023-11-29: qty 50

## 2023-11-29 MED ORDER — DICYCLOMINE HCL 20 MG PO TABS: 20.0000 mg | ORAL_TABLET | Freq: Two times a day (BID) | ORAL | 0 refills | Status: DC

## 2023-11-29 MED ORDER — MORPHINE SULFATE (PF) 4 MG/ML IV SOLN
4.0000 mg | Freq: Once | INTRAVENOUS | Status: AC
  Administered 2023-11-29: 4 mg via INTRAVENOUS
  Filled 2023-11-29: qty 1

## 2023-11-29 MED ORDER — IOHEXOL 300 MG/ML  SOLN
100.0000 mL | Freq: Once | INTRAMUSCULAR | Status: AC | PRN
  Administered 2023-11-29: 100 mL via INTRAVENOUS

## 2023-11-29 MED ORDER — ONDANSETRON HCL 4 MG/2ML IJ SOLN
4.0000 mg | Freq: Once | INTRAMUSCULAR | Status: AC
  Administered 2023-11-29: 4 mg via INTRAVENOUS
  Filled 2023-11-29: qty 2

## 2023-11-29 MED ORDER — LOPERAMIDE HCL 2 MG PO CAPS: 2.0000 mg | ORAL_CAPSULE | Freq: Four times a day (QID) | ORAL | 0 refills | Status: DC | PRN

## 2023-11-29 NOTE — ED Notes (Signed)
 Patient transported to CT

## 2023-11-29 NOTE — ED Triage Notes (Signed)
 Pt c/o lower abdominal pain x 7 days with diarrhea and vomiting starting x 2 days ago; pt describes the pain as a burning sensation

## 2023-11-29 NOTE — Discharge Instructions (Addendum)
 You were evaluated in the Emergency Department and after careful evaluation, we did not find any emergent condition requiring admission or further testing in the hospital.  Your exam/testing today is overall reassuring.  Symptoms likely due to a stomach bug.  Use the Zofran as needed for nausea, use the Bentyl as needed for abdominal pain, use the Imodium as needed for diarrhea.  Follow-up with your primary care doctor for recheck of your liver numbers as we discussed.  Please return to the Emergency Department if you experience any worsening of your condition.   Thank you for allowing [place] to be a part of your care.

## 2023-11-29 NOTE — ED Provider Notes (Addendum)
 [facility]-EMERGENCY DEPT [facility] Emergency Department Provider Note MRN:  [id number]  Arrival date & time: 11/29/23     Chief Complaint   Abdominal Pain   History of Present Illness   Bonnie Jensen is a 48 y.o. year-old female with a history of COPD presenting to the ED with chief complaint of abdominal pain.  Lower abdominal pain for a week, now experiencing nausea vomiting and diarrhea for the past 2 or 3 days.  Denies fever.  Review of Systems  A thorough review of systems was obtained and all systems are negative except as noted in the HPI and PMH.   Patient's Health History    Past Medical History:  Diagnosis Date   Anxiety    Asthma    Brain tumor (benign) (HCC)    during infancy   Chronic abdominal pain    Chronic back pain    COPD (chronic obstructive pulmonary disease) (HCC)    Depression    Gestational diabetes    Migraine     Past Surgical History:  Procedure Laterality Date   ABDOMINAL HYSTERECTOMY     APPENDECTOMY     BRAIN SURGERY     during infancy   CHOLECYSTECTOMY     FINGER SURGERY Right    index   TONSILLECTOMY     TUBAL LIGATION  01/1998    Family History  Problem Relation Age of Onset   Diabetes Mother    Heart disease Mother    Seizures Mother    Hypertension Mother    Cancer Father    Cancer Maternal Grandmother        throat and stomach   Cancer Maternal Grandfather        prostate and lung cancer   Emphysema Maternal Grandfather     Social History   Socioeconomic History   Marital status: Single    Spouse name: Not on file   Number of children: Not on file   Years of education: Not on file   Highest education level: Not on file  Occupational History   Not on file  Tobacco Use   Smoking status: Former    Current packs/day: 0.50    Average packs/day: 0.5 packs/day for 27.0 years (13.5 ttl pk-yrs)    Types: Cigarettes   Smokeless tobacco: Never  Vaping Use   Vaping status: Every Day   Substances: Nicotine   Substance and Sexual Activity   Alcohol use: No   Drug use: No   Sexual activity: Never    Birth control/protection: Surgical  Other Topics Concern   Not on file  Social History Narrative   Not on file   Social Drivers of Health   Financial Resource Strain: Not on file  Food Insecurity: Not on file  Transportation Needs: Not on file  Physical Activity: Not on file  Stress: Not on file  Social Connections: Not on file  Intimate Partner Violence: Not on file     Physical Exam   Vitals:   11/29/23 0457  BP: (!) 150/95  Pulse: 80  Resp: 16  Temp: 97.8 F (36.6 C)  SpO2: 98%    CONSTITUTIONAL: Well-appearing, NAD NEURO/PSYCH:  Alert and oriented x 3, no focal deficits EYES:  eyes equal and reactive ENT/NECK:  no LAD, no JVD CARDIO: Regular rate, well-perfused, normal S1 and S2 PULM:  CTAB no wheezing or rhonchi GI/GU:  non-distended, non-tender MSK/SPINE:  No gross deformities, no edema SKIN:  no rash, atraumatic   *Additional and/or pertinent findings  included in MDM below  Diagnostic and Interventional Summary    EKG Interpretation Date/Time:    Ventricular Rate:    PR Interval:    QRS Duration:    QT Interval:    QTC Calculation:   R Axis:      Text Interpretation:         Labs Reviewed  COMPREHENSIVE METABOLIC PANEL - Abnormal; Notable for the following components:      Result Value   Glucose, Bld 112 (*)    AST 138 (*)    ALT 197 (*)    All other components within normal limits  URINALYSIS, ROUTINE W REFLEX MICROSCOPIC - Abnormal; Notable for the following components:   Color, Urine STRAW (*)    All other components within normal limits  LIPASE, BLOOD  CBC    CT ABDOMEN PELVIS W CONTRAST  Final Result      Medications  ondansetron (ZOFRAN) injection 4 mg (4 mg Intravenous Given 11/29/23 0552)  morphine (PF) 4 MG/ML injection 4 mg (4 mg Intravenous Given 11/29/23 0554)  famotidine (PEPCID) IVPB 20 mg premix (0 mg Intravenous Stopped  11/29/23 0649)  iohexol (OMNIPAQUE) 300 MG/ML solution 100 mL (100 mLs Intravenous Contrast Given 11/29/23 2536)     Procedures  /  Critical Care Procedures  ED Course and Medical Decision Making  Initial Impression and Ddx Question colitis, intra-abdominal infection, diverticulitis.  Past medical/surgical history that increases complexity of ED encounter: None  Interpretation of Diagnostics I personally reviewed the laboratory assessment and my interpretation is as follows: LFT elevation, otherwise no significant blood count or electrolyte disturbance  CT largely unremarkable  Patient Reassessment and Ultimate Disposition/Management     Patient has no right upper quadrant tenderness whatsoever, used to drink heavily but quit 2 years ago, suspect LFT elevation related to fatty liver disease and/or prior drinking history, no indication for follow-up testing regarding this, can be followed in the outpatient setting.  Appropriate for discharge.  Patient management required discussion with the following services or consulting groups:  None  Complexity of Problems Addressed Acute illness or injury that poses threat of life of bodily function  Additional Data Reviewed and Analyzed Further history obtained from: Prior labs/imaging results  Additional Factors Impacting ED Encounter Risk Consideration of hospitalization  Elmer Sow. Pilar Plate, MD [facility] Emergency Medicine [facility] [email or web address]  Final Clinical Impressions(s) / ED Diagnoses     ICD-10-CM   1. Lower abdominal pain  R10.30     2. Nausea vomiting and diarrhea  R11.2    R19.7     3. LFT elevation  R79.89       ED Discharge Orders          Ordered    ondansetron (ZOFRAN-ODT) 4 MG disintegrating tablet  Every 8 hours PRN        11/29/23 0720    loperamide (IMODIUM) 2 MG capsule  4 times daily PRN        11/29/23 0720    dicyclomine (BENTYL) 20 MG tablet  2 times daily        11/29/23  0720             Discharge Instructions Discussed with and Provided to Patient:     Discharge Instructions      You were evaluated in the Emergency Department and after careful evaluation, we did not find any emergent condition requiring admission or further testing in the hospital.  Your exam/testing today is overall reassuring.  Symptoms likely due to a stomach bug.  Use the Zofran as needed for nausea, use the Bentyl as needed for abdominal pain, use the Imodium as needed for diarrhea.  Follow-up with your primary care doctor for recheck of your liver numbers as we discussed.  Please return to the Emergency Department if you experience any worsening of your condition.   Thank you for allowing [place] to be a part of your care.       Sabas Sous, MD 11/29/23 0865    Sabas Sous, MD 11/29/23 [phone]

## 2023-12-12 ENCOUNTER — Emergency Department (HOSPITAL_COMMUNITY)

## 2023-12-12 ENCOUNTER — Inpatient Hospital Stay (HOSPITAL_COMMUNITY)

## 2023-12-12 ENCOUNTER — Inpatient Hospital Stay (HOSPITAL_COMMUNITY): Admitting: Certified Registered Nurse Anesthetist

## 2023-12-12 ENCOUNTER — Encounter (HOSPITAL_COMMUNITY): Admission: EM | Disposition: E | Payer: Self-pay | Source: Home / Self Care | Attending: Internal Medicine

## 2023-12-12 ENCOUNTER — Inpatient Hospital Stay (HOSPITAL_COMMUNITY)
Admission: EM | Admit: 2023-12-12 | Discharge: 2024-01-02 | DRG: 853 | Disposition: E | Attending: Internal Medicine | Admitting: Internal Medicine

## 2023-12-12 ENCOUNTER — Other Ambulatory Visit: Payer: Self-pay

## 2023-12-12 ENCOUNTER — Encounter (HOSPITAL_COMMUNITY): Payer: Self-pay | Admitting: Radiology

## 2023-12-12 DIAGNOSIS — K55059 Acute (reversible) ischemia of intestine, part and extent unspecified: Secondary | ICD-10-CM | POA: Diagnosis not present

## 2023-12-12 DIAGNOSIS — R570 Cardiogenic shock: Secondary | ICD-10-CM | POA: Diagnosis present

## 2023-12-12 DIAGNOSIS — Z833 Family history of diabetes mellitus: Secondary | ICD-10-CM

## 2023-12-12 DIAGNOSIS — K55069 Acute infarction of intestine, part and extent unspecified: Secondary | ICD-10-CM | POA: Diagnosis present

## 2023-12-12 DIAGNOSIS — E875 Hyperkalemia: Secondary | ICD-10-CM | POA: Diagnosis present

## 2023-12-12 DIAGNOSIS — R197 Diarrhea, unspecified: Secondary | ICD-10-CM

## 2023-12-12 DIAGNOSIS — N179 Acute kidney failure, unspecified: Secondary | ICD-10-CM | POA: Diagnosis present

## 2023-12-12 DIAGNOSIS — R7401 Elevation of levels of liver transaminase levels: Secondary | ICD-10-CM | POA: Diagnosis not present

## 2023-12-12 DIAGNOSIS — R579 Shock, unspecified: Secondary | ICD-10-CM

## 2023-12-12 DIAGNOSIS — I214 Non-ST elevation (NSTEMI) myocardial infarction: Secondary | ICD-10-CM | POA: Diagnosis not present

## 2023-12-12 DIAGNOSIS — A419 Sepsis, unspecified organism: Principal | ICD-10-CM | POA: Diagnosis present

## 2023-12-12 DIAGNOSIS — J449 Chronic obstructive pulmonary disease, unspecified: Secondary | ICD-10-CM

## 2023-12-12 DIAGNOSIS — R0603 Acute respiratory distress: Secondary | ICD-10-CM | POA: Diagnosis not present

## 2023-12-12 DIAGNOSIS — K7200 Acute and subacute hepatic failure without coma: Secondary | ICD-10-CM | POA: Diagnosis present

## 2023-12-12 DIAGNOSIS — Z515 Encounter for palliative care: Secondary | ICD-10-CM

## 2023-12-12 DIAGNOSIS — I2109 ST elevation (STEMI) myocardial infarction involving other coronary artery of anterior wall: Secondary | ICD-10-CM | POA: Diagnosis present

## 2023-12-12 DIAGNOSIS — Z825 Family history of asthma and other chronic lower respiratory diseases: Secondary | ICD-10-CM

## 2023-12-12 DIAGNOSIS — E119 Type 2 diabetes mellitus without complications: Secondary | ICD-10-CM

## 2023-12-12 DIAGNOSIS — Z888 Allergy status to other drugs, medicaments and biological substances status: Secondary | ICD-10-CM

## 2023-12-12 DIAGNOSIS — J4520 Mild intermittent asthma, uncomplicated: Secondary | ICD-10-CM | POA: Diagnosis present

## 2023-12-12 DIAGNOSIS — I6521 Occlusion and stenosis of right carotid artery: Secondary | ICD-10-CM | POA: Diagnosis present

## 2023-12-12 DIAGNOSIS — N19 Unspecified kidney failure: Principal | ICD-10-CM | POA: Diagnosis present

## 2023-12-12 DIAGNOSIS — Z885 Allergy status to narcotic agent status: Secondary | ICD-10-CM

## 2023-12-12 DIAGNOSIS — M545 Low back pain, unspecified: Secondary | ICD-10-CM | POA: Diagnosis present

## 2023-12-12 DIAGNOSIS — E872 Acidosis, unspecified: Secondary | ICD-10-CM | POA: Diagnosis present

## 2023-12-12 DIAGNOSIS — Z800 Family history of malignant neoplasm of digestive organs: Secondary | ICD-10-CM

## 2023-12-12 DIAGNOSIS — I959 Hypotension, unspecified: Secondary | ICD-10-CM

## 2023-12-12 DIAGNOSIS — K559 Vascular disorder of intestine, unspecified: Secondary | ICD-10-CM

## 2023-12-12 DIAGNOSIS — Z66 Do not resuscitate: Secondary | ICD-10-CM | POA: Diagnosis present

## 2023-12-12 DIAGNOSIS — E860 Dehydration: Secondary | ICD-10-CM | POA: Diagnosis present

## 2023-12-12 DIAGNOSIS — F17210 Nicotine dependence, cigarettes, uncomplicated: Secondary | ICD-10-CM | POA: Diagnosis present

## 2023-12-12 DIAGNOSIS — Z820 Family history of epilepsy and other diseases of the nervous system: Secondary | ICD-10-CM

## 2023-12-12 DIAGNOSIS — Z8632 Personal history of gestational diabetes: Secondary | ICD-10-CM

## 2023-12-12 DIAGNOSIS — R188 Other ascites: Secondary | ICD-10-CM | POA: Diagnosis present

## 2023-12-12 DIAGNOSIS — M6282 Rhabdomyolysis: Secondary | ICD-10-CM | POA: Diagnosis present

## 2023-12-12 DIAGNOSIS — R531 Weakness: Secondary | ICD-10-CM

## 2023-12-12 DIAGNOSIS — Z801 Family history of malignant neoplasm of trachea, bronchus and lung: Secondary | ICD-10-CM

## 2023-12-12 DIAGNOSIS — Z8249 Family history of ischemic heart disease and other diseases of the circulatory system: Secondary | ICD-10-CM

## 2023-12-12 DIAGNOSIS — F418 Other specified anxiety disorders: Secondary | ICD-10-CM

## 2023-12-12 DIAGNOSIS — I5021 Acute systolic (congestive) heart failure: Secondary | ICD-10-CM | POA: Diagnosis not present

## 2023-12-12 DIAGNOSIS — J4489 Other specified chronic obstructive pulmonary disease: Secondary | ICD-10-CM | POA: Diagnosis present

## 2023-12-12 DIAGNOSIS — Z79899 Other long term (current) drug therapy: Secondary | ICD-10-CM

## 2023-12-12 DIAGNOSIS — Z91030 Bee allergy status: Secondary | ICD-10-CM

## 2023-12-12 DIAGNOSIS — E8721 Acute metabolic acidosis: Secondary | ICD-10-CM

## 2023-12-12 DIAGNOSIS — Z86011 Personal history of benign neoplasm of the brain: Secondary | ICD-10-CM

## 2023-12-12 DIAGNOSIS — Z1152 Encounter for screening for COVID-19: Secondary | ICD-10-CM

## 2023-12-12 DIAGNOSIS — F17290 Nicotine dependence, other tobacco product, uncomplicated: Secondary | ICD-10-CM | POA: Diagnosis present

## 2023-12-12 HISTORY — PX: LAPAROTOMY: SHX154

## 2023-12-12 HISTORY — PX: RIGHT HEART CATH: CATH118263

## 2023-12-12 LAB — ECHOCARDIOGRAM COMPLETE
AR max vel: 2.28 cm2
AV Area VTI: 2.41 cm2
AV Area mean vel: 2.06 cm2
AV Mean grad: 10 mmHg
AV Peak grad: 16.5 mmHg
Ao pk vel: 2.03 m/s
Area-P 1/2: 3.85 cm2
Height: 60 in
S' Lateral: 1.9 cm
Weight: 3174.62 [oz_av]

## 2023-12-12 LAB — POCT I-STAT 7, (LYTES, BLD GAS, ICA,H+H)
Acid-base deficit: 7 mmol/L — ABNORMAL HIGH (ref 0.0–2.0)
Bicarbonate: 20.1 mmol/L (ref 20.0–28.0)
Calcium, Ion: 0.9 mmol/L — ABNORMAL LOW (ref 1.15–1.40)
HCT: 27 % — ABNORMAL LOW (ref 36.0–46.0)
Hemoglobin: 9.2 g/dL — ABNORMAL LOW (ref 12.0–15.0)
O2 Saturation: 100 %
Potassium: 6.4 mmol/L (ref 3.5–5.1)
Sodium: 134 mmol/L — ABNORMAL LOW (ref 135–145)
TCO2: 22 mmol/L (ref 22–32)
pCO2 arterial: 45.7 mmHg (ref 32–48)
pH, Arterial: 7.252 — ABNORMAL LOW (ref 7.35–7.45)
pO2, Arterial: 320 mmHg — ABNORMAL HIGH (ref 83–108)

## 2023-12-12 LAB — I-STAT CHEM 8, ED
BUN: 37 mg/dL — ABNORMAL HIGH (ref 6–20)
Calcium, Ion: 0.82 mmol/L — CL (ref 1.15–1.40)
Chloride: 104 mmol/L (ref 98–111)
Creatinine, Ser: 2.8 mg/dL — ABNORMAL HIGH (ref 0.44–1.00)
Glucose, Bld: 143 mg/dL — ABNORMAL HIGH (ref 70–99)
HCT: 38 % (ref 36.0–46.0)
Hemoglobin: 12.9 g/dL (ref 12.0–15.0)
Potassium: 8 mmol/L (ref 3.5–5.1)
Sodium: 132 mmol/L — ABNORMAL LOW (ref 135–145)
TCO2: 14 mmol/L — ABNORMAL LOW (ref 22–32)

## 2023-12-12 LAB — BASIC METABOLIC PANEL
Anion gap: 25 — ABNORMAL HIGH (ref 5–15)
Anion gap: 18 — ABNORMAL HIGH (ref 5–15)
BUN: 28 mg/dL — ABNORMAL HIGH (ref 6–20)
BUN: 29 mg/dL — ABNORMAL HIGH (ref 6–20)
CO2: 9 mmol/L — ABNORMAL LOW (ref 22–32)
CO2: 13 mmol/L — ABNORMAL LOW (ref 22–32)
Calcium: 7.2 mg/dL — ABNORMAL LOW (ref 8.9–10.3)
Calcium: 6.6 mg/dL — ABNORMAL LOW (ref 8.9–10.3)
Chloride: 100 mmol/L (ref 98–111)
Chloride: 101 mmol/L (ref 98–111)
Creatinine, Ser: 3.16 mg/dL — ABNORMAL HIGH (ref 0.44–1.00)
Creatinine, Ser: 2.97 mg/dL — ABNORMAL HIGH (ref 0.44–1.00)
GFR, Estimated: 18 mL/min — ABNORMAL LOW (ref >60)
GFR, Estimated: 19 mL/min — ABNORMAL LOW (ref >60)
Glucose, Bld: 203 mg/dL — ABNORMAL HIGH (ref 70–99)
Glucose, Bld: 362 mg/dL — ABNORMAL HIGH (ref 70–99)
Potassium: 6.8 mmol/L (ref 3.5–5.1)
Potassium: 5.8 mmol/L — ABNORMAL HIGH (ref 3.5–5.1)
Sodium: 134 mmol/L — ABNORMAL LOW (ref 135–145)
Sodium: 132 mmol/L — ABNORMAL LOW (ref 135–145)

## 2023-12-12 LAB — LIPID PANEL
Cholesterol: 156 mg/dL (ref 0–200)
HDL: 37 mg/dL — ABNORMAL LOW (ref >40)
LDL Cholesterol: 92 mg/dL (ref 0–99)
Total CHOL/HDL Ratio: 4.2 ratio
Triglycerides: 136 mg/dL (ref <150)
VLDL: 27 mg/dL (ref 0–40)

## 2023-12-12 LAB — POCT I-STAT EG7
Acid-base deficit: 14 mmol/L — ABNORMAL HIGH (ref 0.0–2.0)
Acid-base deficit: 14 mmol/L — ABNORMAL HIGH (ref 0.0–2.0)
Acid-base deficit: 11 mmol/L — ABNORMAL HIGH (ref 0.0–2.0)
Bicarbonate: 12.7 mmol/L — ABNORMAL LOW (ref 20.0–28.0)
Bicarbonate: 12.8 mmol/L — ABNORMAL LOW (ref 20.0–28.0)
Bicarbonate: 18.5 mmol/L — ABNORMAL LOW (ref 20.0–28.0)
Calcium, Ion: 0.88 mmol/L — CL (ref 1.15–1.40)
Calcium, Ion: 0.89 mmol/L — CL (ref 1.15–1.40)
Calcium, Ion: 0.69 mmol/L — CL (ref 1.15–1.40)
HCT: 44 % (ref 36.0–46.0)
HCT: 44 % (ref 36.0–46.0)
HCT: 35 % — ABNORMAL LOW (ref 36.0–46.0)
Hemoglobin: 15 g/dL (ref 12.0–15.0)
Hemoglobin: 15 g/dL (ref 12.0–15.0)
Hemoglobin: 11.9 g/dL — ABNORMAL LOW (ref 12.0–15.0)
O2 Saturation: 63 %
O2 Saturation: 68 %
O2 Saturation: 85 %
Potassium: 6.8 mmol/L (ref 3.5–5.1)
Potassium: 6.8 mmol/L (ref 3.5–5.1)
Potassium: 5.8 mmol/L — ABNORMAL HIGH (ref 3.5–5.1)
Sodium: 133 mmol/L — ABNORMAL LOW (ref 135–145)
Sodium: 133 mmol/L — ABNORMAL LOW (ref 135–145)
Sodium: 134 mmol/L — ABNORMAL LOW (ref 135–145)
TCO2: 14 mmol/L — ABNORMAL LOW (ref 22–32)
TCO2: 14 mmol/L — ABNORMAL LOW (ref 22–32)
TCO2: 20 mmol/L — ABNORMAL LOW (ref 22–32)
pCO2, Ven: 32.8 mmHg — ABNORMAL LOW (ref 44–60)
pCO2, Ven: 32.9 mmHg — ABNORMAL LOW (ref 44–60)
pCO2, Ven: 57 mmHg (ref 44–60)
pH, Ven: 7.195 — CL (ref 7.25–7.43)
pH, Ven: 7.198 — CL (ref 7.25–7.43)
pH, Ven: 7.12 — CL (ref 7.25–7.43)
pO2, Ven: 40 mmHg (ref 32–45)
pO2, Ven: 43 mmHg (ref 32–45)
pO2, Ven: 67 mmHg — ABNORMAL HIGH (ref 32–45)

## 2023-12-12 LAB — BLOOD GAS, VENOUS
Acid-base deficit: 14.9 mmol/L — ABNORMAL HIGH (ref 0.0–2.0)
Bicarbonate: 13.2 mmol/L — ABNORMAL LOW (ref 20.0–28.0)
Drawn by: 437
O2 Saturation: 69.3 %
Patient temperature: 36.3
pCO2, Ven: 37 mmHg — ABNORMAL LOW (ref 44–60)
pH, Ven: 7.16 — CL (ref 7.25–7.43)
pO2, Ven: 41 mmHg (ref 32–45)

## 2023-12-12 LAB — COMPREHENSIVE METABOLIC PANEL
ALT: 4322 U/L — ABNORMAL HIGH (ref 0–44)
AST: 4514 U/L — ABNORMAL HIGH (ref 15–41)
Albumin: 3.2 g/dL — ABNORMAL LOW (ref 3.5–5.0)
Alkaline Phosphatase: 137 U/L — ABNORMAL HIGH (ref 38–126)
Anion gap: 22 — ABNORMAL HIGH (ref 5–15)
BUN: 29 mg/dL — ABNORMAL HIGH (ref 6–20)
CO2: 13 mmol/L — ABNORMAL LOW (ref 22–32)
Calcium: 7.2 mg/dL — ABNORMAL LOW (ref 8.9–10.3)
Chloride: 96 mmol/L — ABNORMAL LOW (ref 98–111)
Creatinine, Ser: 2.93 mg/dL — ABNORMAL HIGH (ref 0.44–1.00)
GFR, Estimated: 19 mL/min — ABNORMAL LOW (ref >60)
Glucose, Bld: 157 mg/dL — ABNORMAL HIGH (ref 70–99)
Potassium: >7.5 mmol/L (ref 3.5–5.1)
Sodium: 131 mmol/L — ABNORMAL LOW (ref 135–145)
Total Bilirubin: 0.7 mg/dL (ref 0.0–1.2)
Total Protein: 6.6 g/dL (ref 6.5–8.1)

## 2023-12-12 LAB — CBC
HCT: 38.9 % (ref 36.0–46.0)
Hemoglobin: 12.4 g/dL (ref 12.0–15.0)
MCH: 31.2 pg (ref 26.0–34.0)
MCHC: 31.9 g/dL (ref 30.0–36.0)
MCV: 97.7 fL (ref 80.0–100.0)
Platelets: 95 10*3/uL — ABNORMAL LOW (ref 150–400)
RBC: 3.98 MIL/uL (ref 3.87–5.11)
RDW: 14 % (ref 11.5–15.5)
WBC: 22 10*3/uL — ABNORMAL HIGH (ref 4.0–10.5)
nRBC: 0.1 % (ref 0.0–0.2)

## 2023-12-12 LAB — POTASSIUM: Potassium: 5.7 mmol/L — ABNORMAL HIGH (ref 3.5–5.1)

## 2023-12-12 LAB — CBC WITH DIFFERENTIAL/PLATELET
Abs Immature Granulocytes: 0.59 10*3/uL — ABNORMAL HIGH (ref 0.00–0.07)
Basophils Absolute: 0.1 10*3/uL (ref 0.0–0.1)
Basophils Relative: 0 %
Eosinophils Absolute: 0 10*3/uL (ref 0.0–0.5)
Eosinophils Relative: 0 %
HCT: 44.4 % (ref 36.0–46.0)
Hemoglobin: 13.8 g/dL (ref 12.0–15.0)
Immature Granulocytes: 2 %
Lymphocytes Relative: 7 %
Lymphs Abs: 1.9 10*3/uL (ref 0.7–4.0)
MCH: 31 pg (ref 26.0–34.0)
MCHC: 31.1 g/dL (ref 30.0–36.0)
MCV: 99.8 fL (ref 80.0–100.0)
Monocytes Absolute: 1.2 10*3/uL — ABNORMAL HIGH (ref 0.1–1.0)
Monocytes Relative: 5 %
Neutro Abs: 21.7 10*3/uL — ABNORMAL HIGH (ref 1.7–7.7)
Neutrophils Relative %: 86 %
Platelets: 187 10*3/uL (ref 150–400)
RBC: 4.45 MIL/uL (ref 3.87–5.11)
RDW: 14 % (ref 11.5–15.5)
WBC: 25.5 10*3/uL — ABNORMAL HIGH (ref 4.0–10.5)
nRBC: 0.3 % — ABNORMAL HIGH (ref 0.0–0.2)

## 2023-12-12 LAB — TROPONIN I (HIGH SENSITIVITY): Troponin I (High Sensitivity): 11909 ng/L (ref <18)

## 2023-12-12 LAB — CBG MONITORING, ED: Glucose-Capillary: 153 mg/dL — ABNORMAL HIGH (ref 70–99)

## 2023-12-12 LAB — PROTIME-INR
INR: 2.2 — ABNORMAL HIGH (ref 0.8–1.2)
Prothrombin Time: 25.1 s — ABNORMAL HIGH (ref 11.4–15.2)

## 2023-12-12 LAB — RAPID URINE DRUG SCREEN, HOSP PERFORMED
Amphetamines: NOT DETECTED
Barbiturates: NOT DETECTED
Benzodiazepines: NOT DETECTED
Cocaine: NOT DETECTED
Opiates: NOT DETECTED
Tetrahydrocannabinol: NOT DETECTED

## 2023-12-12 LAB — URINALYSIS, ROUTINE W REFLEX MICROSCOPIC
Bilirubin Urine: NEGATIVE
Glucose, UA: NEGATIVE mg/dL
Ketones, ur: NEGATIVE mg/dL
Nitrite: NEGATIVE
Protein, ur: 30 mg/dL — AB
Specific Gravity, Urine: 1.02 (ref 1.005–1.030)
pH: 5 (ref 5.0–8.0)

## 2023-12-12 LAB — HEPATITIS PANEL, ACUTE
HCV Ab: REACTIVE — AB
Hep A IgM: NONREACTIVE
Hep B C IgM: NONREACTIVE
Hepatitis B Surface Ag: NONREACTIVE

## 2023-12-12 LAB — DIC (DISSEMINATED INTRAVASCULAR COAGULATION)PANEL
D-Dimer, Quant: >20 ug{FEU}/mL — ABNORMAL HIGH (ref 0.00–0.50)
Fibrinogen: 61 mg/dL — CL (ref 210–475)
INR: 3.8 — ABNORMAL HIGH (ref 0.8–1.2)
Platelets: 100 10*3/uL — ABNORMAL LOW (ref 150–400)
Prothrombin Time: 37.9 s — ABNORMAL HIGH (ref 11.4–15.2)
Smear Review: NONE SEEN
aPTT: 48 s — ABNORMAL HIGH (ref 24–36)

## 2023-12-12 LAB — HCG, QUANTITATIVE, PREGNANCY: hCG, Beta Chain, Quant, S: 1 m[IU]/mL (ref <5)

## 2023-12-12 LAB — GLUCOSE, CAPILLARY
Glucose-Capillary: 165 mg/dL — ABNORMAL HIGH (ref 70–99)
Glucose-Capillary: 236 mg/dL — ABNORMAL HIGH (ref 70–99)

## 2023-12-12 LAB — APTT: aPTT: 44 s — ABNORMAL HIGH (ref 24–36)

## 2023-12-12 LAB — CK: Total CK: >50000 U/L — ABNORMAL HIGH (ref 38–234)

## 2023-12-12 LAB — LACTIC ACID, PLASMA
Lactic Acid, Venous: 8.5 mmol/L (ref 0.5–1.9)
Lactic Acid, Venous: 8.3 mmol/L (ref 0.5–1.9)

## 2023-12-12 LAB — ETHANOL: Alcohol, Ethyl (B): <10 mg/dL (ref <10)

## 2023-12-12 LAB — RESP PANEL BY RT-PCR (RSV, FLU A&B, COVID)  RVPGX2
Influenza A by PCR: NEGATIVE
Influenza B by PCR: NEGATIVE
Resp Syncytial Virus by PCR: NEGATIVE
SARS Coronavirus 2 by RT PCR: NEGATIVE

## 2023-12-12 LAB — TYPE AND SCREEN
ABO/RH(D): A POS
Antibody Screen: NEGATIVE

## 2023-12-12 LAB — HEMOGLOBIN A1C
Hgb A1c MFr Bld: 5.9 % — ABNORMAL HIGH (ref 4.8–5.6)
Mean Plasma Glucose: 122.63 mg/dL

## 2023-12-12 LAB — HIV ANTIBODY (ROUTINE TESTING W REFLEX): HIV Screen 4th Generation wRfx: NONREACTIVE

## 2023-12-12 LAB — D-DIMER, QUANTITATIVE: D-Dimer, Quant: >20 ug{FEU}/mL — ABNORMAL HIGH (ref 0.00–0.50)

## 2023-12-12 LAB — LIPASE, BLOOD: Lipase: 133 U/L — ABNORMAL HIGH (ref 11–51)

## 2023-12-12 LAB — CG4 I-STAT (LACTIC ACID): Lactic Acid, Venous: 8.1 mmol/L (ref 0.5–1.9)

## 2023-12-12 LAB — MRSA NEXT GEN BY PCR, NASAL: MRSA by PCR Next Gen: NOT DETECTED

## 2023-12-12 LAB — ABO/RH: ABO/RH(D): A POS

## 2023-12-12 SURGERY — RIGHT HEART CATH
Anesthesia: LOCAL

## 2023-12-12 SURGERY — LAPAROTOMY, EXPLORATORY
Anesthesia: General | Site: Abdomen

## 2023-12-12 MED ORDER — NOREPINEPHRINE 4 MG/250ML-% IV SOLN
INTRAVENOUS | Status: AC
Start: 2023-12-12 — End: 2023-12-12
  Administered 2023-12-12: 12 ug/min via INTRAVENOUS
  Filled 2023-12-12: qty 250

## 2023-12-12 MED ORDER — PRISMASOL BGK 2/3.5 32-2-3.5 MEQ/L EC SOLN: Status: DC

## 2023-12-12 MED ORDER — HEPARIN SODIUM (PORCINE) 5000 UNIT/ML IJ SOLN
4000.0000 [IU] | Freq: Once | INTRAMUSCULAR | Status: AC
  Administered 2023-12-12: 4000 [IU] via INTRAVENOUS
  Filled 2023-12-12: qty 1

## 2023-12-12 MED ORDER — SODIUM CHLORIDE 0.9% IV SOLUTION: Freq: Once | INTRAVENOUS | Status: AC

## 2023-12-12 MED ORDER — PIPERACILLIN-TAZOBACTAM 3.375 G IVPB
3.3750 g | Freq: Four times a day (QID) | INTRAVENOUS | Status: DC
  Administered 2023-12-12: 3.375 g via INTRAVENOUS
  Filled 2023-12-12 (×2): qty 50

## 2023-12-12 MED ORDER — ACETYLCYSTEINE LOAD VIA INFUSION
150.0000 mg/kg | Freq: Once | INTRAVENOUS | Status: AC
  Administered 2023-12-12: 13500 mg via INTRAVENOUS
  Filled 2023-12-12: qty 443

## 2023-12-12 MED ORDER — FENTANYL CITRATE (PF) 100 MCG/2ML IJ SOLN
INTRAMUSCULAR | Status: AC
  Filled 2023-12-12: qty 2

## 2023-12-12 MED ORDER — CALCIUM GLUCONATE-NACL 1-0.675 GM/50ML-% IV SOLN
1.0000 g | Freq: Once | INTRAVENOUS | Status: AC
  Administered 2023-12-12: 1000 mg via INTRAVENOUS
  Filled 2023-12-12: qty 50

## 2023-12-12 MED ORDER — MIDAZOLAM HCL 2 MG/2ML IJ SOLN
INTRAMUSCULAR | Status: AC
  Administered 2023-12-12: 2 mg via INTRAVENOUS
  Filled 2023-12-12: qty 2

## 2023-12-12 MED ORDER — HEPARIN SODIUM (PORCINE) 5000 UNIT/ML IJ SOLN
5000.0000 [IU] | Freq: Three times a day (TID) | INTRAMUSCULAR | Status: DC
  Filled 2023-12-12: qty 1

## 2023-12-12 MED ORDER — MIDAZOLAM HCL 2 MG/2ML IJ SOLN: 1.0000 mg | INTRAMUSCULAR | Status: DC | PRN

## 2023-12-12 MED ORDER — DEXMEDETOMIDINE HCL IN NACL 400 MCG/100ML IV SOLN
0.0000 ug/kg/h | INTRAVENOUS | Status: DC
  Administered 2023-12-12: 0.4 ug/kg/h via INTRAVENOUS
  Administered 2023-12-13: 0.7 ug/kg/h via INTRAVENOUS
  Filled 2023-12-12 (×2): qty 100

## 2023-12-12 MED ORDER — FENTANYL 2500MCG IN NS 250ML (10MCG/ML) PREMIX INFUSION: 0.0000 ug/h | INTRAVENOUS | Status: DC

## 2023-12-12 MED ORDER — LIDOCAINE HCL (PF) 1 % IJ SOLN
INTRAMUSCULAR | Status: DC | PRN
  Administered 2023-12-12: 5 mL

## 2023-12-12 MED ORDER — SODIUM CHLORIDE 0.9 % IV SOLN: INTRAVENOUS | Status: DC | PRN

## 2023-12-12 MED ORDER — NALOXONE HCL 2 MG/2ML IJ SOSY: 1.0000 mg | PREFILLED_SYRINGE | Freq: Once | INTRAMUSCULAR | Status: DC

## 2023-12-12 MED ORDER — MIDAZOLAM HCL 2 MG/2ML IJ SOLN
INTRAMUSCULAR | Status: DC | PRN
  Administered 2023-12-12: 2 mg via INTRAVENOUS

## 2023-12-12 MED ORDER — INSULIN ASPART 100 UNIT/ML IJ SOLN
0.0000 [IU] | INTRAMUSCULAR | Status: DC
  Administered 2023-12-12: 3 [IU] via SUBCUTANEOUS
  Administered 2023-12-13: 2 [IU] via SUBCUTANEOUS

## 2023-12-12 MED ORDER — ETOMIDATE 2 MG/ML IV SOLN
INTRAVENOUS | Status: AC
  Administered 2023-12-12: 40 mg via INTRAVENOUS
  Filled 2023-12-12: qty 20

## 2023-12-12 MED ORDER — SODIUM BICARBONATE 8.4 % IV SOLN
INTRAVENOUS | Status: AC
  Administered 2023-12-12: 100 meq via INTRAVENOUS
  Filled 2023-12-12: qty 100

## 2023-12-12 MED ORDER — SODIUM CHLORIDE 0.9 % FOR CRRT: INTRAVENOUS_CENTRAL | Status: DC | PRN

## 2023-12-12 MED ORDER — NOREPINEPHRINE 4 MG/250ML-% IV SOLN
0.0000 ug/min | INTRAVENOUS | Status: DC
  Administered 2023-12-13: 33 ug/min via INTRAVENOUS
  Filled 2023-12-12: qty 250

## 2023-12-12 MED ORDER — FUROSEMIDE 10 MG/ML IJ SOLN: 60.0000 mg | Freq: Once | INTRAMUSCULAR | Status: DC

## 2023-12-12 MED ORDER — LIDOCAINE HCL (PF) 1 % IJ SOLN
INTRAMUSCULAR | Status: AC
Start: 2023-12-12 — End: ?
  Filled 2023-12-12: qty 30

## 2023-12-12 MED ORDER — HYDROMORPHONE HCL 1 MG/ML IJ SOLN
1.0000 mg | INTRAMUSCULAR | Status: DC | PRN
  Administered 2023-12-12: 2 mg via INTRAVENOUS
  Filled 2023-12-12: qty 2

## 2023-12-12 MED ORDER — NOREPINEPHRINE 4 MG/250ML-% IV SOLN
INTRAVENOUS | Status: AC
  Filled 2023-12-12: qty 250

## 2023-12-12 MED ORDER — MIDAZOLAM HCL 2 MG/2ML IJ SOLN
INTRAMUSCULAR | Status: DC | PRN
  Administered 2023-12-12: .5 mg via INTRAVENOUS

## 2023-12-12 MED ORDER — FENTANYL BOLUS VIA INFUSION
50.0000 ug | INTRAVENOUS | Status: DC | PRN
  Administered 2023-12-12 (×4): 100 ug via INTRAVENOUS

## 2023-12-12 MED ORDER — PROPOFOL 1000 MG/100ML IV EMUL
5.0000 ug/kg/min | INTRAVENOUS | Status: DC
  Filled 2023-12-12 (×2): qty 100

## 2023-12-12 MED ORDER — HEPARIN SODIUM (PORCINE) 1000 UNIT/ML DIALYSIS
1000.0000 [IU] | INTRAMUSCULAR | Status: DC | PRN
Start: 2023-12-12 — End: 2023-12-13
  Filled 2023-12-12: qty 6

## 2023-12-12 MED ORDER — MIDAZOLAM HCL 2 MG/2ML IJ SOLN: 2.0000 mg | Freq: Once | INTRAMUSCULAR | Status: AC

## 2023-12-12 MED ORDER — ETOMIDATE 2 MG/ML IV SOLN: 40.0000 mg | Freq: Once | INTRAVENOUS | Status: AC

## 2023-12-12 MED ORDER — DEXTROSE 5 % IV SOLN
12.5000 mg/kg/h | INTRAVENOUS | Status: AC
  Administered 2023-12-12: 12.5 mg/kg/h via INTRAVENOUS
  Filled 2023-12-12: qty 90

## 2023-12-12 MED ORDER — DEXTROSE 5 % IV SOLN
6.2500 mg/kg/h | INTRAVENOUS | Status: DC
  Filled 2023-12-12: qty 90

## 2023-12-12 MED ORDER — HYDROMORPHONE HCL 1 MG/ML IJ SOLN
0.5000 mg | INTRAMUSCULAR | Status: DC | PRN
  Administered 2023-12-12: 0.5 mg via INTRAVENOUS
  Filled 2023-12-12: qty 0.5

## 2023-12-12 MED ORDER — LACTATED RINGERS IV BOLUS
2000.0000 mL | Freq: Once | INTRAVENOUS | Status: AC
  Administered 2023-12-12: 2000 mL via INTRAVENOUS

## 2023-12-12 MED ORDER — 0.9 % SODIUM CHLORIDE (POUR BTL) OPTIME
TOPICAL | Status: DC | PRN
  Administered 2023-12-12: 1000 mL

## 2023-12-12 MED ORDER — VASOPRESSIN 20 UNITS/100 ML INFUSION FOR SHOCK
0.0400 [IU]/min | INTRAVENOUS | Status: DC
  Administered 2023-12-12: 0.04 [IU]/min via INTRAVENOUS
  Filled 2023-12-12: qty 100

## 2023-12-12 MED ORDER — HEPARIN (PORCINE) 25000 UT/250ML-% IV SOLN: 800.0000 [IU]/h | INTRAVENOUS | Status: DC

## 2023-12-12 MED ORDER — MIDAZOLAM HCL 2 MG/2ML IJ SOLN
INTRAMUSCULAR | Status: AC
  Filled 2023-12-12: qty 2

## 2023-12-12 MED ORDER — DOCUSATE SODIUM 100 MG PO CAPS: 100.0000 mg | ORAL_CAPSULE | Freq: Two times a day (BID) | ORAL | Status: DC | PRN

## 2023-12-12 MED ORDER — HEPARIN (PORCINE) 25000 UT/250ML-% IV SOLN
800.0000 [IU]/h | INTRAVENOUS | Status: DC
  Administered 2023-12-12: 800 [IU]/h via INTRAVENOUS
  Filled 2023-12-12: qty 250

## 2023-12-12 MED ORDER — IOHEXOL 350 MG/ML SOLN
100.0000 mL | Freq: Once | INTRAVENOUS | Status: AC | PRN
  Administered 2023-12-12: 100 mL via INTRAVENOUS

## 2023-12-12 MED ORDER — DOCUSATE SODIUM 50 MG/5ML PO LIQD
100.0000 mg | Freq: Two times a day (BID) | ORAL | Status: DC
  Administered 2023-12-13: 100 mg
  Filled 2023-12-12: qty 10

## 2023-12-12 MED ORDER — POLYETHYLENE GLYCOL 3350 17 G PO PACK: 17.0000 g | PACK | Freq: Every day | ORAL | Status: DC | PRN

## 2023-12-12 MED ORDER — NALOXONE HCL 2 MG/2ML IJ SOSY
PREFILLED_SYRINGE | INTRAMUSCULAR | Status: AC
Start: 2023-12-12 — End: 2023-12-12
  Administered 2023-12-12: 2 mg via INTRAVENOUS
  Filled 2023-12-12: qty 2

## 2023-12-12 MED ORDER — SODIUM CHLORIDE 0.9 % IV BOLUS
2000.0000 mL | Freq: Once | INTRAVENOUS | Status: AC
  Administered 2023-12-12: 2000 mL via INTRAVENOUS

## 2023-12-12 MED ORDER — EPINEPHRINE HCL 5 MG/250ML IV SOLN IN NS
0.5000 ug/min | INTRAVENOUS | Status: DC
  Filled 2023-12-12: qty 250

## 2023-12-12 MED ORDER — IOHEXOL 350 MG/ML SOLN
75.0000 mL | Freq: Once | INTRAVENOUS | Status: AC | PRN
  Administered 2023-12-12: 75 mL via INTRAVENOUS

## 2023-12-12 MED ORDER — HEPARIN (PORCINE) IN NACL 1000-0.9 UT/500ML-% IV SOLN
INTRAVENOUS | Status: DC | PRN
  Administered 2023-12-12: 1000 mL

## 2023-12-12 MED ORDER — PANTOPRAZOLE SODIUM 40 MG IV SOLR
40.0000 mg | Freq: Two times a day (BID) | INTRAVENOUS | Status: DC
  Administered 2023-12-12 – 2023-12-13 (×2): 40 mg via INTRAVENOUS
  Filled 2023-12-12 (×3): qty 10

## 2023-12-12 MED ORDER — CALCIUM CHLORIDE 10 % IV SOLN
INTRAVENOUS | Status: DC | PRN
Start: 2023-12-12 — End: 2023-12-12
  Administered 2023-12-12 (×6): 500 mg via INTRAVENOUS

## 2023-12-12 MED ORDER — SODIUM CHLORIDE 0.9% IV SOLUTION: Freq: Once | INTRAVENOUS | Status: DC

## 2023-12-12 MED ORDER — SODIUM CHLORIDE 0.9 % IV SOLN: INTRAVENOUS | Status: AC

## 2023-12-12 MED ORDER — STERILE WATER FOR INJECTION IV SOLN
INTRAVENOUS | Status: DC
  Filled 2023-12-12 (×2): qty 1000
  Filled 2023-12-12: qty 150

## 2023-12-12 MED ORDER — ONDANSETRON HCL 4 MG/2ML IJ SOLN: 4.0000 mg | Freq: Four times a day (QID) | INTRAMUSCULAR | Status: DC | PRN

## 2023-12-12 MED ORDER — FENTANYL 2500MCG IN NS 250ML (10MCG/ML) PREMIX INFUSION
INTRAVENOUS | Status: AC
  Administered 2023-12-12: 200 ug/h via INTRAVENOUS
  Filled 2023-12-12: qty 250

## 2023-12-12 MED ORDER — ASPIRIN 81 MG PO CHEW: 324.0000 mg | CHEWABLE_TABLET | Freq: Once | ORAL | Status: DC

## 2023-12-12 MED ORDER — SODIUM BICARBONATE 8.4 % IV SOLN
50.0000 meq | Freq: Once | INTRAVENOUS | Status: AC
  Administered 2023-12-12: 50 meq via INTRAVENOUS
  Filled 2023-12-12: qty 50

## 2023-12-12 MED ORDER — DEXTROSE 50 % IV SOLN
1.0000 | Freq: Once | INTRAVENOUS | Status: AC
  Administered 2023-12-12: 50 mL via INTRAVENOUS
  Filled 2023-12-12: qty 50

## 2023-12-12 MED ORDER — FENTANYL CITRATE (PF) 100 MCG/2ML IJ SOLN
INTRAMUSCULAR | Status: DC | PRN
  Administered 2023-12-12: 12.5 ug via INTRAVENOUS

## 2023-12-12 MED ORDER — FENTANYL CITRATE PF 50 MCG/ML IJ SOSY: 100.0000 ug | PREFILLED_SYRINGE | Freq: Once | INTRAMUSCULAR | Status: AC

## 2023-12-12 MED ORDER — INSULIN ASPART 100 UNIT/ML IV SOLN
5.0000 [IU] | Freq: Once | INTRAVENOUS | Status: AC
  Administered 2023-12-12: 5 [IU] via INTRAVENOUS

## 2023-12-12 MED ORDER — SODIUM BICARBONATE 8.4 % IV SOLN
INTRAVENOUS | Status: DC | PRN
  Administered 2023-12-12 (×2): 25 meq via INTRAVENOUS
  Administered 2023-12-12: 50 meq via INTRAVENOUS

## 2023-12-12 MED ORDER — SODIUM ZIRCONIUM CYCLOSILICATE 10 G PO PACK
10.0000 g | PACK | Freq: Once | ORAL | Status: AC
  Administered 2023-12-12: 10 g via ORAL
  Filled 2023-12-12 (×2): qty 1

## 2023-12-12 MED ORDER — SODIUM ZIRCONIUM CYCLOSILICATE 10 G PO PACK
10.0000 g | PACK | Freq: Three times a day (TID) | ORAL | Status: DC
  Administered 2023-12-13: 10 g via ORAL
  Filled 2023-12-12: qty 1

## 2023-12-12 MED ORDER — SODIUM BICARBONATE 8.4 % IV SOLN: 100.0000 meq | Freq: Once | INTRAVENOUS | Status: AC

## 2023-12-12 MED ORDER — PIPERACILLIN-TAZOBACTAM 3.375 G IVPB
3.3750 g | Freq: Three times a day (TID) | INTRAVENOUS | Status: DC
  Administered 2023-12-12: 3.375 g via INTRAVENOUS
  Filled 2023-12-12: qty 50

## 2023-12-12 MED ORDER — FENTANYL CITRATE PF 50 MCG/ML IJ SOSY
PREFILLED_SYRINGE | INTRAMUSCULAR | Status: AC
  Administered 2023-12-12: 100 ug via INTRAVENOUS
  Filled 2023-12-12: qty 2

## 2023-12-12 MED ORDER — PHENYLEPHRINE 80 MCG/ML (10ML) SYRINGE FOR IV PUSH (FOR BLOOD PRESSURE SUPPORT)
PREFILLED_SYRINGE | INTRAVENOUS | Status: AC
  Filled 2023-12-12: qty 10

## 2023-12-12 MED ORDER — ONDANSETRON HCL 4 MG/2ML IJ SOLN
4.0000 mg | Freq: Once | INTRAMUSCULAR | Status: AC
  Administered 2023-12-12: 4 mg via INTRAVENOUS
  Filled 2023-12-12: qty 2

## 2023-12-12 MED ORDER — NALOXONE HCL 2 MG/2ML IJ SOSY: 2.0000 mg | PREFILLED_SYRINGE | Freq: Once | INTRAMUSCULAR | Status: AC

## 2023-12-12 MED ORDER — NOREPINEPHRINE 4 MG/250ML-% IV SOLN
0.0000 ug/min | INTRAVENOUS | Status: DC
  Administered 2023-12-12: 34 ug/min via INTRAVENOUS
  Filled 2023-12-12: qty 250

## 2023-12-12 MED ORDER — ROCURONIUM BROMIDE 10 MG/ML (PF) SYRINGE
PREFILLED_SYRINGE | INTRAVENOUS | Status: DC | PRN
Start: 2023-12-12 — End: 2023-12-12
  Administered 2023-12-12 (×2): 50 mg via INTRAVENOUS

## 2023-12-12 MED ORDER — EPINEPHRINE HCL 5 MG/250ML IV SOLN IN NS
INTRAVENOUS | Status: AC
  Filled 2023-12-12: qty 250

## 2023-12-12 MED ORDER — ROCURONIUM BROMIDE 10 MG/ML (PF) SYRINGE
PREFILLED_SYRINGE | INTRAVENOUS | Status: AC
  Filled 2023-12-12: qty 10

## 2023-12-12 MED ORDER — LACTATED RINGERS IV SOLN: INTRAVENOUS | Status: DC

## 2023-12-12 MED ORDER — POLYETHYLENE GLYCOL 3350 17 G PO PACK: 17.0000 g | PACK | Freq: Every day | ORAL | Status: DC

## 2023-12-12 SURGICAL SUPPLY — 9 items
CATH SWAN GANZ 7F STRAIGHT (CATHETERS) IMPLANT
ELECT DEFIB PAD ADLT CADENCE (PAD) IMPLANT
GLIDESHEATH SLEND SS 6F .021 (SHEATH) IMPLANT
GUIDEWIRE INQWIRE 1.5J.035X260 (WIRE) IMPLANT
INQWIRE 1.5J .035X260CM (WIRE) IMPLANT
KIT MICROPUNCTURE NIT STIFF (SHEATH) IMPLANT
PACK CARDIAC CATHETERIZATION (CUSTOM PROCEDURE TRAY) ×1 IMPLANT
SET ATX-X65L (MISCELLANEOUS) IMPLANT
SHEATH PINNACLE 7F 10CM (SHEATH) IMPLANT

## 2023-12-12 SURGICAL SUPPLY — 30 items
CANISTER WOUNDNEG PRESSURE 500 (CANNISTER) IMPLANT
CHLORAPREP W/TINT 26 (MISCELLANEOUS) ×1 IMPLANT
COVER SURGICAL LIGHT HANDLE (MISCELLANEOUS) ×1 IMPLANT
DRAPE LAPAROSCOPIC ABDOMINAL (DRAPES) ×1 IMPLANT
ELECT BLADE 6.5 EXT (BLADE) IMPLANT
ELECT CAUTERY BLADE 6.4 (BLADE) ×1 IMPLANT
ELECT REM PT RETURN 9FT ADLT (ELECTROSURGICAL) ×1 IMPLANT
ELECTRODE REM PT RTRN 9FT ADLT (ELECTROSURGICAL) ×1 IMPLANT
GLOVE BIO SURGEON STRL SZ7.5 (GLOVE) ×1 IMPLANT
GLOVE BIOGEL PI IND STRL 8 (GLOVE) ×1 IMPLANT
GOWN STRL REUS W/ TWL XL LVL3 (GOWN DISPOSABLE) ×1 IMPLANT
HANDLE SUCTION POOLE (INSTRUMENTS) ×1 IMPLANT
KIT BASIN OR (CUSTOM PROCEDURE TRAY) ×1 IMPLANT
KIT TURNOVER KIT B (KITS) ×1 IMPLANT
LIGASURE IMPACT 36 18CM CVD LR (INSTRUMENTS) IMPLANT
NS IRRIG 1000ML POUR BTL (IV SOLUTION) ×2 IMPLANT
PACK GENERAL/GYN (CUSTOM PROCEDURE TRAY) ×1 IMPLANT
PAD ARMBOARD 7.5X6 YLW CONV (MISCELLANEOUS) ×1 IMPLANT
PENCIL SMOKE EVACUATOR (MISCELLANEOUS) ×1 IMPLANT
RELOAD PROXIMATE 75MM BLUE (ENDOMECHANICALS) ×2 IMPLANT
RELOAD STAPLE 75 3.8 BLU REG (ENDOMECHANICALS) IMPLANT
SPONGE ABD ABTHERA ADVANCE (MISCELLANEOUS) IMPLANT
SPONGE T-LAP 18X18 ~~LOC~~+RFID (SPONGE) IMPLANT
STAPLER PROXIMATE 75MM BLUE (STAPLE) IMPLANT
SUCTION POOLE HANDLE (INSTRUMENTS) ×1 IMPLANT
SUT SILK 2 0 SH CR/8 (SUTURE) ×1 IMPLANT
SUT SILK 2 0 TIES 10X30 (SUTURE) ×1 IMPLANT
SUT SILK 3 0 SH CR/8 (SUTURE) ×1 IMPLANT
SUT SILK 3 0 TIES 10X30 (SUTURE) ×1 IMPLANT
TOWEL GREEN STERILE (TOWEL DISPOSABLE) ×1 IMPLANT

## 2023-12-12 NOTE — Discharge Instructions (Signed)
 Transport to both [facility] emergency department.  Dr. Rodena Medin is accepting

## 2023-12-12 NOTE — ED Notes (Signed)
 Report given to Maralyn Sago, Consulting civil engineer at [employer] given to [employer]

## 2023-12-12 NOTE — Progress Notes (Signed)
 Pharmacy Antibiotic Note  Bonnie Jensen is a 48 y.o. female admitted on 12/12/2023 with  intra-abd infection .  Pharmacy has been consulted for Zosyn dosing.  Plan: Zosyn 3.375gm IV q8h - each dose over 4 hours Will f/u renal function, micro data, and pt's clinical condition  Height: 5' (152.4 cm) Weight: 90 kg (198 lb 6.6 oz) IBW/kg (Calculated) : 45.5  Temp (24hrs), Avg:96.9 F (36.1 C), Min:96.4 F (35.8 C), Max:97 F (36.1 C)  Recent Labs  Lab 12/12/23 0919 12/12/23 0959 12/12/23 1046 12/12/23 1104 12/12/23 1243 12/12/23 1259 12/12/23 1441  WBC 25.5*  --   --   --   --   --  22.0*  CREATININE  --  2.93* 2.80*  --  3.16*  --  2.97*  LATICACIDVEN  --   --   --  8.5*  --  8.1* 8.3*    Estimated Creatinine Clearance: 23.4 mL/min (A) (by C-G formula based on SCr of 2.97 mg/dL (H)).    Allergies  Allergen Reactions   Bee Venom Anaphylaxis   Ketorolac Tromethamine Anaphylaxis and Other (See Comments)    Seizure type spasms   Imitrex [Sumatriptan Base] Other (See Comments)    Chest pain   Isometheptene-Dichloral-Apap Other (See Comments)    Chest pain   Tramadol Other (See Comments)    Chest pain    Midodrine Rash    Antimicrobials this admission: 3/11 Zosyn >>   Thank you for allowing pharmacy to be a part of this patient's care.  Christoper Fabian, PharmD, BCPS Please see amion for complete clinical pharmacist phone list 12/12/2023 6:17 PM

## 2023-12-12 NOTE — Consult Note (Addendum)
 NAME:  Bonnie Jensen, MRN:  [id number], DOB:  Mar 28, 1976, LOS: 0 ADMISSION DATE:  12/12/2023, CONSULTATION DATE:  3/11 REFERRING MD:  Shirlee Latch, CHIEF COMPLAINT:  cardiogenic shock    History of Present Illness:  48 year old female with past medical history of asthma, chronic abdominal and back pain, COPD, depression who initially presented to [facility] ED on 3/11 AM as code stroke for unresponsiveness. LKW 7PM 3/10. Per EDP note, arrived altered, unable to move left side. She had negative brain imaging. She received 4mg  narcan with improvement in mentation. Code sepsis was also initiated due to hypotension, AMS. She was complaining of chest discomfort when she awake and EKG showed anterior STEMI pattern. Cardiology was consulted and patient was transferred to [facility] for cath lab. Labs at [facility] WBC 25.5, resp panel negative, troponin 11k, Na 131, K >7.5, bicarb 13, BUN 29, sCr 2.93, AST 4514, ALT 4322, alk phos 137, AG 22, UDS negative, UA negative for UTI. She was given bicarb and 2 amps of calcium gluconate to help temporize K. Started on heparin gtt and sent to [facility] cath lab. In cath lab, echo with okay LV, RV dilated. RA 5, PAP 22/13( 8), PCWP 2, CO/CI 4.07/2.19, PAPi 1.8. Transferred to CVICU post cath lab with swan. CCM consulted for assistance in management.   On arrival, on nasal cannula. Awake, alert and oriented. Not on vasopressors. She is complaining of abdominal pain and back pain. She has weakness on the left which is mildly improved from initial arrival. 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 Does endorse 2 Goody powders daily for the past 3 months.   Pertinent  Medical History   asthma, chronic abdominal and back pain, COPD, depression, poly substance use   Significant Hospital Events: Including procedures, antibiotic start and stop dates in addition to other pertinent events    3/11: [facility] for AMS. Code stroke negative. Troponin 11k>15K transfer to [facility] for emergent cath which was negative. RV reduced. ICU post cath. Not in shock. Ongoing abdominal pain. Below for plan.   Interim History / Subjective:  Awake, alert, oriented. Complains of abdominal pain. Having watery diarrhea. No obvious bloody diarrhea. Chest wall pain.   Objective   Blood pressure 132/70, pulse 84, temperature (!) 96.4 F (35.8 C), temperature source Axillary, resp. rate (!) 34, height 5' (1.524 m), weight 90 kg, last menstrual period 07/16/2018, SpO2 94%.        Intake/Output Summary (Last 24 hours) at 12/12/2023 1318 Last data filed at 12/12/2023 1143 Gross per 24 hour  Intake 100 ml  Output --  Net 100 ml   Filed Weights   12/12/23 1020  Weight: 90 kg    Examination: General: middle aged female, laying in bed, acutely ill appearing, not in extremis  HENT: [place]/at, perrla, mmm, nasal cannula Lungs: clear bilaterally, on 2LNC, no resp distress  Cardiovascular: s1s2 with obvious murmur, rub, gallop Abdomen: rounded, not rigid, tenderness to light palpation  Extremities: no pitting edema  Neuro: awake, alert, oriented x3, non focal neuro exam GU: foley, dark urine  Resolved Hospital Problem list    Assessment & Plan:  Acute renal failure  AGMA  Hyperkalemia  Presenting sCr 2.93, K >7.5. No history of renal dysfunction. Initial bicarb 13 AG 22. Given amp bicarb. Her K was temporized in ED with calcium gluconate and bicarb. She was given dextrose and insulin, lokelma on arrival  to ICU. Query dehydration vs rhabdomyolysis. She adamantly denies drug use but recent admission for heroin use and narcan successful in ED. UDS negative. But wonder if she was unconscious on the floor for long period of time. No PO intake, rhabdo? AGMA likely resulting from poor perfusion and AKI - f/u CK  - f/u renal ultrasound  - 2L LR followed by continue IVF ~ 175/hr  - bicarb gtt  - temporize k with  dextrose/insulin, lokelma - lasix per nephrology  - BMP q4h  - nephrology consulted, appreciate assistance, no dialysis currently   NSTEMI Initial troponin 11K>15k with chest pain. RHC with normal PA pressure, mildly low PAPi, CI 2.2 by Fick. Echo EF 70-75%, LV hyperdynamic, no RWMA, G1DD. RV mildly enlarged. - heart failure following, appreciate recs  - heparin gtt  - may need CT PE at some point when more stable but on differential  - trend trop   Lactic acidosis  Shock, resolved  Unknown source, suspect component of septic shock vs hypovolemic shock. Not currently on pressor support  - trend lactic, wbc, fever curve  - 2L LR, continuous after  - f/u procal   Abdominal pain Elevated liver enzymes  Diarrhea  May be source of her lactic acidosis. Unsure. She is having severe abdominal pain. LFTs in the 1000s. Watery, non-bloody diarrhea. No vomiting. Abdomen is not rigid. Doubt dissection given chronicity and not in shock.  - f/u abdominal ultrasound  - will get stat ct abdomen/pelvis with oral contrast  - f/u lipase, hepatitis panel, HIV, acetaminophen and salicylate level  - NAC for now until proven otherwise  - trend LFTs   Best Practice (right click and "Reselect all SmartList Selections" daily)   Diet/type: NPO DVT prophylaxis: SCD Pressure ulcer(s): na GI prophylaxis: PPI Lines: Central line and yes and it is still needed Foley:  Yes, and it is still needed Code Status:  full code Last date of multidisciplinary goals of care discussion [updated at bedside]  Labs   CBC: Recent Labs  Lab 12/12/23 0919 12/12/23 1046  WBC 25.5*  --   NEUTROABS 21.7*  --   HGB 13.8 12.9  HCT 44.4 38.0  MCV 99.8  --   PLT 187  --     Basic Metabolic Panel: Recent Labs  Lab 12/12/23 0959 12/12/23 1046  NA 131* 132*  K >7.5* 8.0*  CL 96* 104  CO2 13*  --   GLUCOSE 157* 143*  BUN 29* 37*  CREATININE 2.93* 2.80*  CALCIUM 7.2*  --    GFR: Estimated Creatinine Clearance:  24.8 mL/min (A) (by C-G formula based on SCr of 2.8 mg/dL (H)). Recent Labs  Lab 12/12/23 0919 12/12/23 1104  WBC 25.5*  --   LATICACIDVEN  --  8.5*    Liver Function Tests: Recent Labs  Lab 12/12/23 0959  AST 4,514*  ALT 4,322*  ALKPHOS 137*  BILITOT 0.7  PROT 6.6  ALBUMIN 3.2*   No results for input(s): "LIPASE", "AMYLASE" in the last 168 hours. No results for input(s): "AMMONIA" in the last 168 hours.  ABG    Component Value Date/Time   PHART 7.365 01/14/2014 0230   PCO2ART 35.2 01/14/2014 0230   PO2ART 89.7 01/14/2014 0230   HCO3 13.2 (L) 12/12/2023 1104   TCO2 14 (L) 12/12/2023 1046   ACIDBASEDEF 14.9 (H) 12/12/2023 1104   O2SAT 69.3 12/12/2023 1104     Coagulation Profile: Recent Labs  Lab 12/12/23 0919  INR 2.2*    Cardiac  Enzymes: No results for input(s): "CKTOTAL", "CKMB", "CKMBINDEX", "TROPONINI" in the last 168 hours.  HbA1C: Hgb A1c MFr Bld  Date/Time Value Ref Range Status  12/20/2017 09:51 AM 5.7 (H) 4.8 - 5.6 % Final    Comment:    (NOTE) Pre diabetes:          5.7%-6.4% Diabetes:              >6.4% Glycemic control for   <7.0% adults with diabetes   01/14/2014 01:52 AM 5.4 <5.7 % Final    Comment:    (NOTE)                                                                       According to the ADA Clinical Practice Recommendations for 2011, when HbA1c is used as a screening test:  >=6.5%   Diagnostic of Diabetes Mellitus           (if abnormal result is confirmed) 5.7-6.4%   Increased risk of developing Diabetes Mellitus References:Diagnosis and Classification of Diabetes Mellitus,Diabetes Care,2011,34(Suppl 1):S62-S69 and Standards of Medical Care in         Diabetes - 2011,Diabetes Care,2011,34 (Suppl 1):S11-S61.    CBG: Recent Labs  Lab 12/12/23 0917  GLUCAP 153*    Review of Systems:   As above   Past Medical History:  She,  has a past medical history of Anxiety, Asthma, Brain tumor (benign) (HCC), Chronic abdominal  pain, Chronic back pain, COPD (chronic obstructive pulmonary disease) (HCC), Depression, Gestational diabetes, and Migraine.   Surgical History:   Past Surgical History:  Procedure Laterality Date   ABDOMINAL HYSTERECTOMY     APPENDECTOMY     BRAIN SURGERY     during infancy   CHOLECYSTECTOMY     FINGER SURGERY Right    index   TONSILLECTOMY     TUBAL LIGATION  01/1998     Social History:   reports that she has quit smoking. Her smoking use included cigarettes. She has a 13.5 pack-year smoking history. She has never used smokeless tobacco. She reports that she does not drink alcohol and does not use drugs.   Family History:  Her family history includes Cancer in her father, maternal grandfather, and maternal grandmother; Diabetes in her mother; Emphysema in her maternal grandfather; Heart disease in her mother; Hypertension in her mother; Seizures in her mother.   Allergies Allergies  Allergen Reactions   Bee Venom Anaphylaxis   Ketorolac Tromethamine Anaphylaxis and Other (See Comments)    Seizure type spasms   Imitrex [Sumatriptan Base] Other (See Comments)    Chest pain   Isometheptene-Dichloral-Apap Other (See Comments)    Chest pain   Tramadol Other (See Comments)    Chest pain    Midodrine Rash     Home Medications  Prior to Admission medications   Medication Sig Start Date End Date Taking? Authorizing Provider  dicyclomine (BENTYL) 20 MG tablet Take 1 tablet (20 mg total) by mouth 2 (two) times daily. 11/29/23   Sabas Sous, MD  lisinopril (PRINIVIL,ZESTRIL) 10 MG tablet Take 1 tablet (10 mg total) by mouth daily. 02/08/18   Jacquelin Hawking, PA-C  loperamide (IMODIUM) 2 MG capsule Take 1 capsule (2 mg total) by mouth 4 (  four) times daily as needed for diarrhea or loose stools. 11/29/23   Sabas Sous, MD  ondansetron (ZOFRAN-ODT) 4 MG disintegrating tablet Take 1 tablet (4 mg total) by mouth every 8 (eight) hours as needed for nausea or vomiting. 11/29/23   Sabas Sous, MD     Critical care time: 70   Lenard Galloway [facility] 12/12/23 2:07 PM  Please see [email or web address] for pager details.  From 7A-7P if no response, please call [phone] After hours, please call ELink [phone]

## 2023-12-12 NOTE — Progress Notes (Signed)
 PHARMACY - ANTICOAGULATION CONSULT NOTE  Pharmacy Consult for heparin  Indication: chest pain/ACS  Allergies  Allergen Reactions   Bee Venom Anaphylaxis   Ketorolac Tromethamine Anaphylaxis and Other (See Comments)    Seizure type spasms   Imitrex [Sumatriptan Base] Other (See Comments)    Chest pain   Isometheptene-Dichloral-Apap Other (See Comments)    Chest pain   Tramadol Other (See Comments)    Chest pain    Midodrine Rash    Patient Measurements: Height: 5' (152.4 cm) Weight: 90 kg (198 lb 6.6 oz) IBW/kg (Calculated) : 45.5 Heparin Dosing Weight: 67kg  Vital Signs: Temp: 96.4 F (35.8 C) (03/11 1000) Temp Source: Axillary (03/11 1000) BP: 132/70 (03/11 1105) Pulse Rate: 84 (03/11 1028)  Labs: Recent Labs    12/12/23 0919 12/12/23 0955 12/12/23 0959 12/12/23 1046 12/12/23 1104 12/12/23 1243  HGB 13.8  --   --  12.9  --   --   HCT 44.4  --   --  38.0  --   --   PLT 187  --   --   --   --   --   APTT 44*  --   --   --   --   --   LABPROT 25.1*  --   --   --   --   --   INR 2.2*  --   --   --   --   --   CREATININE  --   --  2.93* 2.80*  --  3.16*  TROPONINIHS  --  11,909*  --   --  15,776*  --     Estimated Creatinine Clearance: 22 mL/min (A) (by C-G formula based on SCr of 3.16 mg/dL (H)).   Medical History: Past Medical History:  Diagnosis Date   Anxiety    Asthma    Brain tumor (benign) (HCC)    during infancy   Chronic abdominal pain    Chronic back pain    COPD (chronic obstructive pulmonary disease) (HCC)    Depression    Gestational diabetes    Migraine    Assessment: 48 year old female admitted for possible stroke.  Patient now with elevated troponin >11000. New orders to start heparin for stemi with plan for transfer to [facility] for workup.   INR 2.2, she does not appear to be on blood thinners prior to admit, LFTs are greater than 4000. Patient appears to be in renal failure with potassium 7-8 receiving treatment in ED. CBC fortunately  appears within normal limits.   Heparin bolus already given in ED, will use stroke protocol for heparin dosing going forward.   3/11 PM update: Heparin was not running on transfer from [facility].  4000 unit heparin bolus given @11a , 800 units/hr ordered but not started.  D-dimer >20.  Goal of Therapy:  Heparin level 0.3-0.5 units/ml Monitor platelets by anticoagulation protocol: Yes   Plan:  Start heparin infusion at 800 units/hr Check anti-Xa level in 8 hours  Continue to monitor H&H and platelets  Trixie Rude, PharmD Clinical Pharmacist 12/12/2023  3:29 PM

## 2023-12-12 NOTE — Transfer of Care (Signed)
 Immediate Anesthesia Transfer of Care Note  Patient: Bonnie Jensen  Procedure(s) Performed: LAPAROTOMY, EXPLORATORY  Patient Location: ICU  Anesthesia Type:General  Level of Consciousness: Patient remains intubated per anesthesia plan  Airway & Oxygen Therapy: Patient remains intubated per anesthesia plan and Patient placed on Ventilator (see vital sign flow sheet for setting)  Post-op Assessment: Report given to RN and Patient in critical condition on multiple vasoactive gtts.   Post vital signs: Reviewed  Last Vitals:  Vitals Value Taken Time  BP 115/88 12/12/23 2254  Temp    Pulse 113 12/12/23 2254  Resp 20 12/12/23 2254  SpO2 100 % 12/12/23 2254  Vitals shown include unfiled device data.  Last Pain:  Vitals:   12/12/23 2000  TempSrc: Axillary  PainSc:          Complications: No notable events documented.

## 2023-12-12 NOTE — Progress Notes (Addendum)
 Evening round: decision made to intubate patient. Got HD cath, a-line. General surgery was consulted to evaluate patient at bedside for ongoing significant abdominal pain. Will get CT scan of abdomen pelvis as well as angio dissection study to identify what is causing her worsening shock, persistent lactic acidosis. Differential is still broad but concern for perforated ulcer vs dissection vs ischemic bowel vs other. She may need diagnostic laparoscopic procedure even if imaging is negative. Family is updated.

## 2023-12-12 NOTE — Procedures (Signed)
 Intubation Procedure Note  CALISA LUCKENBAUGH  [id number]  1976/05/17  Date:12/12/23  Time:6:05 PM   Provider Performing:Yaroslav Gombos E  Cherlynn Polo    Procedure: Intubation (31500)  Indication(s) Respiratory Failure  Consent Risks of the procedure as well as the alternatives and risks of each were explained to the patient and/or caregiver.  Consent for the procedure was obtained and is signed in the bedside chart   Anesthesia Etomidate, Versed, and Fentanyl   Time Out Verified patient identification, verified procedure, site/side was marked, verified correct patient position, special equipment/implants available, medications/allergies/relevant history reviewed, required imaging and test results available.   Sterile Technique Usual hand hygeine, masks, and gloves were used   Procedure Description Patient positioned in bed supine.  Sedation given as noted above.  Patient was intubated with endotracheal tube using Glidescope.  View was Grade 1 full glottis .  Number of attempts was 1.  Colorimetric CO2 detector was consistent with tracheal placement.   Complications/Tolerance None; patient tolerated the procedure well. Chest X-ray is ordered to verify placement.   EBL Minimal   Specimen(s) None   Cristopher Peru, PA-C [facility] 12/12/23 6:05 PM  Please see [email or web address] for pager details.  From 7A-7P if no response, please call [phone] After hours, please call ELink [phone]

## 2023-12-12 NOTE — Progress Notes (Signed)
 12/12/2023 Benefits of IV contrast outweigh risks.  Myrla Halsted MD PCCM

## 2023-12-12 NOTE — Consult Note (Addendum)
 [facility] Nephrology Consultation Note  Requesting MD: Dr. Levon Hedger Reason for consult: Hyperkalemia, AKI  HPI:  Bonnie Jensen is a 48 y.o. female with past medical history significant for COPD, anxiety depression, chronic back pain, recent relapse of opioid use per chart presented to [facility] with a code stroke for left-sided weakness and slurred speech found to have abnormal labs therefore transferred to [facility], seen as a consultation for AKI and hyperkalemia. The initial vitals shows temperature 96.4, heart rate 117, blood pressure 81/46 dropped to lowest 69/55..  The labs showed sodium level 131, potassium level was peaked at 8, BUN 157, creatinine level 2.93, elevated liver enzymes, troponin level of 15,776, lactic acid 8.5.  CT head  with no acute finding.  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  Chest x-ray with low volume and no acute finding. The patient was started on heparin drip and was emergently transported to [facility].  The emergent right heart cath showed low filling pressure, markedly low cardiac output. The hyperkalemia was treated with Lokelma, IV fluid bolus, sodium bicarbonate, insulin dextrose.  The repeat labs showed potassium level trending down to 6.8 and creatinine level of 3.16. The patient is currently getting IV fluid, blood pressure has improved.  She denies nausea, vomiting, chest pain or shortness of breath.  Reported that she has been taking Goody powder for her back pain for a long time. UA trace protein but essentially bland.  Getting US abdomen. Patient's brother was presented with her.  PMHx:   Past Medical History:  Diagnosis Date   Anxiety    Asthma    Brain tumor (benign) (HCC)    during infancy   Chronic abdominal pain    Chronic back pain    COPD (chronic obstructive pulmonary disease) (HCC)    Depression    Gestational diabetes    Migraine     Past Surgical History:  Procedure Laterality Date    ABDOMINAL HYSTERECTOMY     APPENDECTOMY     BRAIN SURGERY     during infancy   CHOLECYSTECTOMY     FINGER SURGERY Right    index   TONSILLECTOMY     TUBAL LIGATION  01/1998    Family Hx:  Family History  Problem Relation Age of Onset   Diabetes Mother    Heart disease Mother    Seizures Mother    Hypertension Mother    Cancer Father    Cancer Maternal Grandmother        throat and stomach   Cancer Maternal Grandfather        prostate and lung cancer   Emphysema Maternal Grandfather     Social History:  reports that she has quit smoking. Her smoking use included cigarettes. She has a 13.5 pack-year smoking history. She has never used smokeless tobacco. She reports that she does not drink alcohol and does not use drugs.  Allergies:  Allergies  Allergen Reactions   Bee Venom Anaphylaxis   Ketorolac Tromethamine Anaphylaxis and Other (See Comments)    Seizure type spasms   Imitrex [Sumatriptan Base] Other (See Comments)    Chest pain   Isometheptene-Dichloral-Apap Other (See Comments)    Chest pain   Tramadol Other (See Comments)    Chest pain    Midodrine Rash    Medications: Prior to Admission medications   Medication Sig Start Date End Date Taking? Authorizing Provider  buprenorphine-naloxone (SUBOXONE) 8-2 mg SUBL SL tablet Place 1 tablet  under the tongue daily. 09/15/23  Yes [provider]  dicyclomine (BENTYL) 20 MG tablet Take 1 tablet (20 mg total) by mouth 2 (two) times daily. 11/29/23   Sabas Sous, MD  lisinopril (PRINIVIL,ZESTRIL) 10 MG tablet Take 1 tablet (10 mg total) by mouth daily. 02/08/18   Jacquelin Hawking, PA-C  loperamide (IMODIUM) 2 MG capsule Take 1 capsule (2 mg total) by mouth 4 (four) times daily as needed for diarrhea or loose stools. 11/29/23   Sabas Sous, MD  ondansetron (ZOFRAN-ODT) 4 MG disintegrating tablet Take 1 tablet (4 mg total) by mouth every 8 (eight) hours as needed for nausea or vomiting. 11/29/23   Sabas Sous,  MD    I have reviewed the patient's current medications.  Labs: Renal Panel: Recent Labs  Lab 12/12/23 0959 12/12/23 1046 12/12/23 1243  NA 131* 132* 134*  K >7.5* 8.0* 6.8*  CL 96* 104 100  CO2 13*  --  9*  GLUCOSE 157* 143* 203*  BUN 29* 37* 28*  CREATININE 2.93* 2.80* 3.16*  CALCIUM 7.2*  --  7.2*     CBC:    Latest Ref Rng & Units 12/12/2023   10:46 AM 12/12/2023    9:19 AM 11/29/2023    5:27 AM  CBC  WBC 4.0 - 10.5 K/uL  25.5  6.3   Hemoglobin 12.0 - 15.0 g/dL 29.5  62.1  30.8   Hematocrit 36.0 - 46.0 % 38.0  44.4  37.1   Platelets 150 - 400 K/uL  187  266      Anemia Panel:  Recent Labs    11/29/23 0527 12/12/23 0919 12/12/23 1046  HGB 12.6 13.8 12.9  MCV 90.9 99.8  --     Recent Labs  Lab 12/12/23 0959  AST 4,514*  ALT 4,322*  ALKPHOS 137*  BILITOT 0.7  PROT 6.6  ALBUMIN 3.2*    Lab Results  Component Value Date   HGBA1C 5.7 (H) 12/20/2017    ROS:  Pertinent items noted in HPI and remainder of comprehensive ROS otherwise negative.  Physical Exam: Vitals:   12/12/23 1028 12/12/23 1105  BP: (!) 69/55 132/70  Pulse: 84   Resp: (!) 33 (!) 34  Temp:    SpO2: 94%      General exam: Appears calm and comfortable  Respiratory system: Clear to auscultation. Respiratory effort normal. No wheezing or crackle Cardiovascular system: S1 & S2 heard, RRR.  No pedal edema. Gastrointestinal system: Abdomen is nondistended, soft and nontender. Normal bowel sounds heard. Central nervous system: Alert and oriented. No focal neurological deficits. Extremities: Symmetric 5 x 5 power. Skin: No rashes, lesions or ulcers Psychiatry: Alert awake and following commands.  Assessment/Plan:  # Acute kidney injury likely ischemic ATN due to hypotension/severe dehydration concomitant with the use of NSAIDs.  Right heart cath with low filling pressure and low volume.  Agree with IV fluid hydration.  UA with only trace protein.  Noted ultrasound abdomen was  ordered.  The patient has received IV contrast with angio in the ER. Strict ins and out and repeat lab. Follow CK level  # Severe hyperkalemia in the setting of AKI: Treated medically with improvement of potassium level down to 6.8.   I will order a dose of Lasix with the fluid to increase potassium excretion. Lokelma TID. Repeating potassium level this afternoon and check every 4 hours.  Continue medical treatment.  I have discussed with the patient and her brother that if no improvement  in potassium level with current management then she might need dialysis/CRRT.  Try to avoid dialysis if possible.  # Severe anion gap metabolic acidosis/lactic acidosis with hypotension/poor perfusion and AKI.  Agree with sodium bicarbonate, maintain good perfusion with fluid.  Follow lab.  # NSTEMI/shock: Per cardiology team.  BP seems to have improved.  Discussed with ICU team and family members. Thank you for the consult, continue to follow with you.   Zymiere Trostle Jaynie Collins 12/12/2023, 2:31 PM  [facility].

## 2023-12-12 NOTE — Progress Notes (Signed)
 Patient with non-viable amount of intestine remaining after surgery.  I recommend moving to comfort measures.  Discussed this with the family.  For now I will change the patient to DNR.  They were agreeable to this.  I encouraged them to spend some time with the patient when she returns from the OR and critical care team would be by to initiate comfort care later.  Quentin Ore, MD General, Bariatric and Minimally Invasive Surgery [facility]

## 2023-12-12 NOTE — Progress Notes (Signed)
 CT with portal venous gas, diffuse pneumatosis throughout small intestine and colon.  Concern for non-occlusive mesenteric ischemia, but no clear explanation for an extra-abdominal cause of severe sepsis.  I discussed this with family and power of attorney Marvis Moeller.  We discussed option to proceed with exploratory laparotomy or the option to focus on comfort care.  I explained exploratory laparotomy would possibly involve open abdomen, drains, multiple returns to the operating room, possible bowel resection without immediate anastomosis, and possible findings of extensive bowel necrosis deeming ongoing care futile and transitioning the patient fto comofrt care.  Without surgery, I do not think she has a chance of survival.  With surgery I think she has a small chance for survival.  Family expressed she has a young grand child and a full life and would want to fight till the end so they believe she would want to proceed with surgery. We will proceed emergently to the OR tonight.  Quentin Ore, MD General, Bariatric and Minimally Invasive Surgery [facility]

## 2023-12-12 NOTE — Anesthesia Preprocedure Evaluation (Signed)
 Anesthesia Evaluation  Patient identified by MRN, date of birth, ID band Patient unresponsive    Reviewed: Allergy & Precautions, Patient's Chart, lab work & pertinent test resultsPreop documentation limited or incomplete due to emergent nature of procedure.  Airway Mallampati: Intubated       Dental   Pulmonary COPD, former smoker          Cardiovascular    1. Left ventricular ejection fraction, by estimation, is 70 to 75%. The  left ventricle has hyperdynamic function. The left ventricle has no  regional wall motion abnormalities. Left ventricular diastolic parameters  are consistent with Grade I diastolic  dysfunction (impaired relaxation).   2. Right ventricular systolic function is normal. The right ventricular  size is mildly enlarged. Tricuspid regurgitation signal is inadequate for  assessing PA pressure.   3. The mitral valve is normal in structure. No evidence of mitral valve  regurgitation. No evidence of mitral stenosis.   4. The aortic valve was not well visualized. Aortic valve regurgitation  is not visualized. No aortic stenosis is present. Aortic valve mean  gradient measures 10.0 mmHg.   5. The inferior vena cava is normal in size with greater than 50%  respiratory variability, suggesting right atrial pressure of 3 mmHg.     Neuro/Psych  Headaches  Anxiety Depression       GI/Hepatic   Endo/Other  diabetes  Lab Results      Component                Value               Date                      HGBA1C                   5.9 (H)             12/12/2023             Renal/GU ARFRenal diseaseLab Results      Component                Value               Date                      NA                       132 (L)             12/12/2023                K                        5.7 (H)             12/12/2023                CO2                      13 (L)              12/12/2023                GLUCOSE                   362 (H)  12/12/2023                BUN                      29 (H)              12/12/2023                CREATININE               2.97 (H)            12/12/2023                CALCIUM                  6.6 (L)             12/12/2023                GFRNONAA                 19 (L)              12/12/2023                Musculoskeletal   Abdominal   Peds  Hematology Lab Results      Component                Value               Date                      WBC                      22.0 (H)            12/12/2023                HGB                      12.4                12/12/2023                HCT                      38.9                12/12/2023                MCV                      97.7                12/12/2023                PLT                      100 (L)             12/12/2023            Lab Results      Component                Value               Date                      INR  3.8 (H)             12/12/2023                INR                      2.2 (H)             12/12/2023                INR                      1.01                01/17/2014            Fibrinogen 60s   Anesthesia Other Findings   Reproductive/Obstetrics                              Anesthesia Physical Anesthesia Plan  ASA: 5 and emergent  Anesthesia Plan: General   Post-op Pain Management:    Induction:   PONV Risk Score and Plan: 4 or greater and Ondansetron and Treatment may vary due to age or medical condition  Airway Management Planned: Oral ETT  Additional Equipment: Arterial line  Intra-op Plan:   Post-operative Plan: Post-operative intubation/ventilation  Informed Consent:      History available from chart only and Only emergency history available  Plan Discussed with: CRNA  Anesthesia Plan Comments:          Anesthesia Quick Evaluation

## 2023-12-12 NOTE — Procedures (Signed)
 Arterial Catheter Insertion Procedure Note  Bonnie Jensen  [id number]  January 16, 1976  Date:12/12/23  Time:6:05 PM    Provider Performing: Cristopher Peru    Procedure: Insertion of Arterial Line (40981) with US guidance (19147)   Indication(s) Blood pressure monitoring and/or need for frequent ABGs  Consent Risks of the procedure as well as the alternatives and risks of each were explained to the patient and/or caregiver.  Consent for the procedure was obtained and is signed in the bedside chart  Anesthesia None   Time Out Verified patient identification, verified procedure, site/side was marked, verified correct patient position, special equipment/implants available, medications/allergies/relevant history reviewed, required imaging and test results available.   Sterile Technique Maximal sterile technique including full sterile barrier drape, hand hygiene, sterile gown, sterile gloves, mask, hair covering, sterile ultrasound probe cover (if used).   Procedure Description Area of catheter insertion was cleaned with chlorhexidine and draped in sterile fashion. With real-time ultrasound guidance an arterial catheter was placed into the left radial artery.  Appropriate arterial tracings confirmed on monitor.     Complications/Tolerance None; patient tolerated the procedure well.   EBL Minimal   Specimen(s) None   Cristopher Peru, PA-C [facility] 12/12/23 6:05 PM  Please see [email or web address] for pager details.  From 7A-7P if no response, please call [phone] After hours, please call ELink [phone]

## 2023-12-12 NOTE — ED Provider Notes (Signed)
 [place] EMERGENCY DEPARTMENT AT [facility] Provider Note   CSN: [id number] Arrival date & time: 12/12/23  6578     History {Add pertinent medical, surgical, social history, OB history to HPI:1} Chief Complaint  Patient presents with   Code Stroke    Bonnie Jensen is a 48 y.o. female.  Patient was found down at home this morning.  She was unresponsive.  She was last seen normal at 7 PM.   Altered Mental Status      Home Medications Prior to Admission medications   Medication Sig Start Date End Date Taking? Authorizing Provider  dicyclomine (BENTYL) 20 MG tablet Take 1 tablet (20 mg total) by mouth 2 (two) times daily. 11/29/23   Sabas Sous, MD  lisinopril (PRINIVIL,ZESTRIL) 10 MG tablet Take 1 tablet (10 mg total) by mouth daily. 02/08/18   Jacquelin Hawking, PA-C  loperamide (IMODIUM) 2 MG capsule Take 1 capsule (2 mg total) by mouth 4 (four) times daily as needed for diarrhea or loose stools. 11/29/23   Sabas Sous, MD  ondansetron (ZOFRAN-ODT) 4 MG disintegrating tablet Take 1 tablet (4 mg total) by mouth every 8 (eight) hours as needed for nausea or vomiting. 11/29/23   Sabas Sous, MD      Allergies    Bee venom, Ketorolac tromethamine, Imitrex [sumatriptan base], Isometheptene-dichloral-apap, Tramadol, and Midodrine    Review of Systems   Review of Systems  Physical Exam Updated Vital Signs BP 132/70   Pulse 84   Temp (!) 96.4 F (35.8 C) (Axillary)   Resp (!) 34   Ht 5' (1.524 m)   Wt 90 kg   LMP 07/16/2018   SpO2 94%   BMI 38.75 kg/m  Physical Exam  ED Results / Procedures / Treatments   Labs (all labs ordered are listed, but only abnormal results are displayed) Labs Reviewed  PROTIME-INR - Abnormal; Notable for the following components:      Result Value   Prothrombin Time 25.1 (*)    INR 2.2 (*)    All other components within normal limits  APTT - Abnormal; Notable for the following components:   aPTT 44 (*)    All other  components within normal limits  URINALYSIS, ROUTINE W REFLEX MICROSCOPIC - Abnormal; Notable for the following components:   APPearance HAZY (*)    Hgb urine dipstick SMALL (*)    Protein, ur 30 (*)    Leukocytes,Ua TRACE (*)    Bacteria, UA RARE (*)    All other components within normal limits  CBC WITH DIFFERENTIAL/PLATELET - Abnormal; Notable for the following components:   WBC 25.5 (*)    nRBC 0.3 (*)    Neutro Abs 21.7 (*)    Monocytes Absolute 1.2 (*)    Abs Immature Granulocytes 0.59 (*)    All other components within normal limits  COMPREHENSIVE METABOLIC PANEL - Abnormal; Notable for the following components:   Sodium 131 (*)    Potassium >7.5 (*)    Chloride 96 (*)    CO2 13 (*)    Glucose, Bld 157 (*)    BUN 29 (*)    Creatinine, Ser 2.93 (*)    Calcium 7.2 (*)    Albumin 3.2 (*)    AST 4,514 (*)    ALT 4,322 (*)    Alkaline Phosphatase 137 (*)    GFR, Estimated 19 (*)    Anion gap 22 (*)    All other components within normal limits  I-STAT CHEM 8, ED - Abnormal; Notable for the following components:   Sodium 132 (*)    Potassium 8.0 (*)    BUN 37 (*)    Creatinine, Ser 2.80 (*)    Glucose, Bld 143 (*)    Calcium, Ion 0.82 (*)    TCO2 14 (*)    All other components within normal limits  CBG MONITORING, ED - Abnormal; Notable for the following components:   Glucose-Capillary 153 (*)    All other components within normal limits  TROPONIN I (HIGH SENSITIVITY) - Abnormal; Notable for the following components:   Troponin I (High Sensitivity) 11,909 (*)    All other components within normal limits  RESP PANEL BY RT-PCR (RSV, FLU A&B, COVID)  RVPGX2  ETHANOL  RAPID URINE DRUG SCREEN, HOSP PERFORMED  HCG, QUANTITATIVE, PREGNANCY  BLOOD GAS, VENOUS  HEMOGLOBIN A1C  LIPID PANEL  LACTIC ACID, PLASMA  TROPONIN I (HIGH SENSITIVITY)    EKG None  Radiology CT ANGIO HEAD NECK W WO CM W PERF (CODE STROKE) Result Date: 12/12/2023 CLINICAL DATA:  Left-sided  weakness EXAM: CT ANGIOGRAPHY HEAD AND NECK CT PERFUSION BRAIN TECHNIQUE: Multidetector CT imaging of the head and neck was performed using the standard protocol during bolus administration of intravenous contrast. Multiplanar CT image reconstructions and MIPs were obtained to evaluate the vascular anatomy. Carotid stenosis measurements (when applicable) are obtained utilizing NASCET criteria, using the distal internal carotid diameter as the denominator. Multiphase CT imaging of the brain was performed following IV bolus contrast injection. Subsequent parametric perfusion maps were calculated using RAPID software. RADIATION DOSE REDUCTION: This exam was performed according to the departmental dose-optimization program which includes automated exposure control, adjustment of the mA and/or kV according to patient size and/or use of iterative reconstruction technique. CONTRAST:  Dose is not known on this in progress study COMPARISON:  Head CT earlier today FINDINGS: CTA NECK FINDINGS Aortic arch: Atheromatous plaque with 3 vessel branching Right carotid system: Low-density wall thickening suggesting atherosclerosis. No ulceration or intimal flap Left carotid system: Mild generalized wall thickening. No ulceration or dissection flap Vertebral arteries: No proximal subclavian stenosis. Dominant left vertebral artery. No vertebral beading or flow reducing stenosis. Skeleton: No acute finding Other neck: Venous gas seen in the bilateral neck. Upper chest: Biapical emphysema. Review of the MIP images confirms the above findings CTA HEAD FINDINGS Anterior circulation: ~70% stenosis of the right ICA at the lacernal segment . No major branch occlusion. No beading or aneurysm seen. Posterior circulation: Dominant left vertebral artery. The vertebral and basilar arteries are widely patent. No branch occlusion, beading, or aneurysm. Venous sinuses: Diffusely patent Anatomic variants: As above Review of the MIP images confirms  the above findings CT Brain Perfusion Findings: ASPECTS: 10 CBF (<30%) Volume: 0 Perfusion (Tmax>6.0s) volume: 169 cc but highlighting the bilateral cerebral hemispheres in a fairly symmetric distribution suggesting global background cause or artifact. IMPRESSION: 1. No emergent large vessel occlusion. 2. 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 There is atherosclerosis but this would be an atypical location for focal atheromatous narrowing. No underlying dissection seen either, cause indeterminate. 3. No core infarct by CT perfusion. Generalized delayed perfusion suggests artifact or global/central cause. Electronically Signed   By: Tiburcio Pea M.D.   On: 12/12/2023 10:00   CT HEAD CODE STROKE WO CONTRAST Result Date: 12/12/2023 CLINICAL DATA:  Code stroke.  Left-sided weakness EXAM: CT HEAD WITHOUT CONTRAST TECHNIQUE: Contiguous axial images were obtained from the base  of the skull through the vertex without intravenous contrast. RADIATION DOSE REDUCTION: This exam was performed according to the departmental dose-optimization program which includes automated exposure control, adjustment of the mA and/or kV according to patient size and/or use of iterative reconstruction technique. COMPARISON:  None Available. FINDINGS: Brain: No evidence of acute infarction, hemorrhage, hydrocephalus, extra-axial collection or mass lesion/mass effect. Vascular: No hyperdense vessel or unexpected calcification. Skull: Normal. Negative for fracture or focal lesion. Sinuses/Orbits: No acute finding. Other: Prelim sent in epic chat ASPECTS [facility] Stroke Program Early CT Score) - Ganglionic level infarction (caudate, lentiform nuclei, internal capsule, insula, M1-M3 cortex): 7 - Supraganglionic infarction (M4-M6 cortex): 3 Total score (0-10 with 10 being normal): 10 IMPRESSION: 1. No acute finding. 2. ASPECTS is 10 Electronically Signed   By: Tiburcio Pea M.D.   On: 12/12/2023 09:35     Procedures Procedures  {Document cardiac monitor, telemetry assessment procedure when appropriate:1}  Medications Ordered in ED Medications  aspirin chewable tablet 324 mg (324 mg Oral Not Given 12/12/23 1100)  calcium gluconate 1 g/ 50 mL sodium chloride IVPB (1,000 mg Intravenous New Bag/Given 12/12/23 1100)  calcium gluconate 1 g/ 50 mL sodium chloride IVPB (1,000 mg Intravenous New Bag/Given 12/12/23 1057)  iohexol (OMNIPAQUE) 350 MG/ML injection 100 mL (100 mLs Intravenous Contrast Given 12/12/23 0951)  sodium chloride 0.9 % bolus 2,000 mL (2,000 mLs Intravenous New Bag/Given 12/12/23 1029)  naloxone (NARCAN) injection 2 mg (2 mg Intravenous Given 12/12/23 1000)  heparin injection 4,000 Units (4,000 Units Intravenous Given 12/12/23 1058)  sodium bicarbonate injection 50 mEq (50 mEq Intravenous Given 12/12/23 1057)  ondansetron (ZOFRAN) injection 4 mg (4 mg Intravenous Given 12/12/23 1058)    ED Course/ Medical Decision Making/ A&P  CRITICAL CARE Performed by: Bethann Berkshire Total critical care time: 60 minutes Critical care time was exclusive of separately billable procedures and treating other patients. Critical care was necessary to treat or prevent imminent or life-threatening deterioration. Critical care was time spent personally by me on the following activities: development of treatment plan with patient and/or surrogate as well as nursing, discussions with consultants, evaluation of patient's response to treatment, examination of patient, obtaining history from patient or surrogate, ordering and performing treatments and interventions, ordering and review of laboratory studies, ordering and review of radiographic studies, pulse oximetry and re-evaluation of patient's condition.  {When patient arrived she was severely altered and could not move her left arm or her left leg.  Code sepsis was called.  CT scan of the head and CT angio of the head did not show stroke or large vessel  occlusion.  Patient had to be given 4 mg of Narcan and she awoke.  Patient was hypotensive and resuscitated.  She still had weakness in that left arm patient had significant chest discomfort and EKG showed an anterior STEMI.  I spoke with Dr. [place] cardiology and he recommended transporting the patient to [facility] emergency department because she has hyperkalemia and felt like she needed to be more stable before she was sent up to the Cath Lab.  Patient was given bicarb and 2 A of calcium. Click here for ABCD2, HEART and other calculatorsREFRESH Note before signing :1}                              Medical Decision Making Amount and/or Complexity of Data Reviewed Labs: ordered. Radiology: ordered.  Risk OTC drugs. Prescription drug management.  Altered mental status that awoke with Narcan with hypotension causing weakness on the left side.  Patient also with an anterior STEMI.  Patient is placed on heparin and will go to [facility] emergency department to see Dr. [place] cardiologist and possibly cardiac cath  {Document critical care time when appropriate:1} {Document review of labs and clinical decision tools ie heart score, Chads2Vasc2 etc:1}  {Document your independent review of radiology images, and any outside records:1} {Document your discussion with family members, caretakers, and with consultants:1} {Document social determinants of health affecting pt's care:1} {Document your decision making why or why not admission, treatments were needed:1} Final Clinical Impression(s) / ED Diagnoses Final diagnoses:  None    Rx / DC Orders ED Discharge Orders     None

## 2023-12-12 NOTE — Progress Notes (Signed)
 Patient back from CT department with telemedicine cart.

## 2023-12-12 NOTE — ED Triage Notes (Signed)
 Pt arrived via [facility] from home. Code Stroke called in the field. Pt last known well last night at 9pm. Pt presents with left side weakness, slurred speech, facial droop. EDP present in Triage.

## 2023-12-12 NOTE — Consult Note (Signed)
 [facility] Telemedicine Consult   Requesting Provider: Bethann Berkshire Consult Participants: Myself, bedside staff, atrium nurse; ex-husband by phone Location of the provider: [facility] Location of the patient: [facility]  This consult was provided via telemedicine with 2-way video and audio communication. The patient/family was informed that care would be provided in this way; explicit consent could not be obtained given altered mental status  Chief Complaint: Left-sided weakness  HPI: 48 year old woman with a past medical history significant for mild intermittent asthma, recent relapse of opiate use (heroin, 12/02/2023 ED visit).  History provided by EMS and confirmed with ex-husband via phone.  He notes that he dropped the patient off last night to her hotel around 7 PM.  This morning he came to her hotel to take her to work at 7:30 AM (she works at [employer]) but she was unable to walk and had some left-sided weakness.  He helped her to the bathroom.  She had a bowel movement on the floor.  On EMS arrival was initially was answer questions and had some slurred speech but had worsening level of consciousness and oxygenation en route.  EMS notes the patient had been complaining of 3 days of worsening back and shoulder pain and was taking a lot of Goody powders but there was no clear concern for substance use on scene; no use of blood thinners endorsed by patient/family although they were not very clear on her medications  EMS noted pinpoint nonreactive pupils as well  LKW: 7 PM Thrombolytic given?: No, out of the window.  Subsequently INR came back at 2.2 IR Thrombectomy? No, no LVO Modified Rankin Scale: 0-Completely asymptomatic and back to baseline post- stroke Time of teleneurologist evaluation: 9:15 AM  Exam: After CT patient's blood pressures were noted to be dropping to 50s over 30s  General: Ill-appearing Pulmonary: Reduced respiratory  rate Cardiac: Tachycardic on the monitor  NIH Stroke scale 1A: Level of Consciousness - 1 1B: Ask Month and Age - 1, incorrect month, correct age 1C: 'Blink Eyes' & 'Squeeze Hands' - 0 2: Test Horizontal Extraocular Movements - 0 3: Test Visual Fields - 0 oriented to stimuli in all visual fields, no clear blink to threat, could not count fingers due to somnolence 4: Test Facial Palsy - 1 possible mild left facial droop 5A: Test Left Arm Motor Drift - 3 5B: Test Right Arm Motor Drift - 1 6A: Test Left Leg Motor Drift - 4 6B: Test Right Leg Motor Drift - 1 7: Test Limb Ataxia - unable to obtain due to somnolence 8: Test Sensation - 2 unaware of being touched on the left side 9: Test Language/Aphasia- 0 too unstable/sleepy to formally test, but following commands and answering simple questions appropriately per EMS 10: Test Dysarthria - 1 11: Test Extinction/Inattention - 0 NIHSS score: 15   Imaging Reviewed:  Head CT personally reviewed, agree with radiology no acute intracranial process CTA, CT perfusion personally reviewed, no LVO on my review although there is a right ICA stenosis, perfusion scan consistent with global hypoperfusion 1. No emergent large vessel occlusion. 2. 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 There is atherosclerosis but this would be an atypical location for focal atheromatous narrowing. No underlying dissection seen either, cause indeterminate. 3. No core infarct by CT perfusion. Generalized delayed perfusion suggests artifact or global/central cause.   Labs reviewed in epic and pertinent values follow:  Basic Metabolic Panel: No results for input(s): "NA", "K", "  CL", "CO2", "GLUCOSE", "BUN", "CREATININE", "CALCIUM", "MG", "PHOS" in the last 168 hours.  CBC: Recent Labs  Lab 12/12/23 0919  WBC 25.5*  NEUTROABS 21.7*  HGB 13.8  HCT 44.4  MCV 99.8  PLT 187    Coagulation Studies: Recent Labs    12/12/23 0919  LABPROT  25.1*  INR 2.2*     Assessment: Hemodynamically unstable patient presenting with left-sided weakness.  Suspect her left-sided symptoms are secondary to her right ICA stenosis making the left side of her brain more symptomatic relative to the right side from global hypoperfusion, supported by CT perfusion findings as well as her low blood pressure after CT scan completed.  Primary treatment for this patient is emergent medical stabilization of cardiac/respiratory issues  Recommendations:  -CTA, CT perfusion obtained on my emergent recommendations to assess for LVO with greater than 6 hours since last known well -MRI brain without contrast when stabilized if patient has not fully recovered (not yet ordered) -Appreciate medical stabilization per ED provider, including further workup of her leukocytosis to 25 and elevated INR -Please reach out to neurology as needed for further guidance after medical stabilization  This patient is receiving care for possible acute neurological changes. There was 45 minutes of care by this provider at the time of service, including time for direct evaluation via telemedicine, review of medical records, imaging studies and discussion of findings with providers, the patient and/or family.  Recommendations conveyed to ED provider via secure chat  Brooke Dare MD-PhD [facility] [phone]   If 8pm-8am, please page neurology on call as listed in AMION.  CRITICAL CARE Performed by: Gordy Councilman   Total critical care time: 45 minutes  Critical care time was exclusive of separately billable procedures and treating other patients.  Critical care was necessary to treat or prevent imminent or life-threatening deterioration --emergent evaluation for consideration of thrombolytic or thrombectomy  Critical care was time spent personally by me on the following activities: development of treatment plan with patient and/or surrogate as well as  nursing, discussions with consultants, evaluation of patient's response to treatment, examination of patient, obtaining history from patient or surrogate, ordering and performing treatments and interventions, ordering and review of laboratory studies, ordering and review of radiographic studies, pulse oximetry and re-evaluation of patient's condition.

## 2023-12-12 NOTE — Op Note (Signed)
 Patient: Bonnie Jensen (04/09/1976, [id number])  Date of Surgery: 12/12/2023  Preoperative Diagnosis: Mesenteric ischemia  Postoperative Diagnosis: Mesenteric ischemia involving the jejunum through the transverse colon  Surgical Procedure:  Exploratory laparotomy Small bowel resection Partial colectomy (right and transverse) Temporary abdominal closure with abthera wound vac  Operative Team Members:  Surgeons and Role:    * Courtnee Myer, Hyman Hopes, MD - Primary   Anesthesiologist: Val Eagle, MD   Anesthesia: General   Fluids:  Total I/O In: 3050.5 [I.V.:2460.5; Blood:590] Out: 125 [Urine:125]  Complications: None  Drains:  none   Specimen: * No specimens in log *   Disposition:   ICU - recommend comfort care  Plan of Care:  Recommend comfort care    Indications for Procedure: Manju BRIGID VANDEKAMP is a 48 y.o. female who presented with abdominal pain and was found to have extensive pneumatosis intestinalis and portal venous gas.  Concern for non-occlusive mesenteric ischemia, but no clear explanation for an extra-abdominal cause of severe sepsis. I discussed this with family and power of attorney Marvis Moeller. We discussed option to proceed with exploratory laparotomy or the option to focus on comfort care. I explained exploratory laparotomy would possibly involve open abdomen, drains, multiple returns to the operating room, possible bowel resection without immediate anastomosis, and possible findings of extensive bowel necrosis deeming ongoing care futile and transitioning the patient fto comfort care. Without surgery, I do not think she has a chance of survival. With surgery I think she has a small chance for survival. Family expressed she has a young grand child and a full life and would want to fight till the end so they believe she would want to proceed with surgery. We will proceed emergently to the OR tonight.   Findings: Extensive mesenteric ischemia with non viable  changes from the proximal jejunum to the splenic flexure of the colon.  60 cm of small intestine  and descending colon remaining which is not a viable amount of intestine.   Description of Procedure:   On the day stated above the patient taken emergently to the operating room.  Antibiotics were given prior to the case start.  A timeout was completed verifying the correct patient, procedure, position, and equipment needed for the case.  A midline laparotomy incision was made.  The small intestine eviscerated from the abdomen upon entry to the abdomen.  There is no trauma the underlying viscera with initial trocar placement.  There was dark hemorrhagic ascites throughout the abdomen.  The bowel was nonviable from 60 cm distal to the ligament of Treitz all the way through to the proximal descending colon.  This was resected and left in discontinuity.  A 75 mm linear stapler was used to divide the bowel and the LigaSure was used to divide the mesentery.  The right colon was mobilized at the white line of Toldt.  The hepatic flexure was mobilized by dividing the attachments to the liver.  Care was taken to avoid damaging retroperitoneal structures.  The splenic flexure was mobilized by dividing the splenic colic ligament and mobilizing the proximal descending colon out of the retroperitoneum by dividing the white line of Toldt.  There were some ischemic changes in the descending colon.  There was some ischemic changes proximal to the jejunal staple line.  I feel additional resection and attempt for curative care in this critically ill patient is futile as she does not have enough intestine to maintain hydration or have a reasonable recovery.  An  ABThera wound VAC was placed for temporary abdominal closure.  The patient was transferred back to the ICU.  I discussed the intraoperative findings with the family and recommended comfort care measures.  At the end of the case we reviewed the infection status of the  case. 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

## 2023-12-12 NOTE — Progress Notes (Signed)
 RT x2 attempted to place art line with no success, PA at bedside and made aware.

## 2023-12-12 NOTE — ED Notes (Signed)
 [facility] called to transport patient

## 2023-12-12 NOTE — ED Notes (Signed)
[facility] here to transport pt 

## 2023-12-12 NOTE — ED Notes (Signed)
CODE STROKE paged out at this time.

## 2023-12-12 NOTE — Progress Notes (Signed)
 EMS pre alert Code Stroke cart activation at 0910. [facility]rologist paged at [phone], and Dr Brooke Dare, joins the telemedicine cart at [phone]. Patient arrives at [phone] and ED provider, Dr Bethann Berkshire, evaluates on arrival [phone]. Patient to CT department with telemedicine cart at [phone].

## 2023-12-12 NOTE — Consult Note (Addendum)
 Advanced Heart Failure Team Consult Note   Primary Physician: Pcp, No Cardiologist:  None  Reason for Consultation: Cardiogenic Shock  HPI:    Bonnie Jensen is seen today for evaluation of Cardiogenic Shock at the request of Dr. [place].   48 year old woman with a past medical history significant for mild intermittent asthma, recent relapse of opiate use (heroin, 12/02/2023 ED visit).   Patient presented to [facility] with Code stroke for L sided weakness and slurred speech. Per ex-husband, she had been having back and shoulder pain for 3 days prior and had been treating pain with Goody powder. Seen by neurology. CTA head without large vessel occlusion, flow reducing stenosis in R ICA. ASPECT score 10. Labs notable for hs-trop 09,811>91478, Na 131, K >7.5, CO2 13, BUN/Cr 29/2.93, AST/ALT 4514, 4322, ical 0.80, LA 8.5. EKG showed sinus rhythm 83 bpm with peaked T waves and ST elevation in V1-2   UA negative, RVP negative. She was started on heparin gtt and emergently transported to [facility] cath lab.   Echo per Dr. Shirlee Latch in cath lab. LV appears okay, however RV is dilated. Plan to perform RHC and PAC placement and transfer to ICU for further care. Anticipated that she will need emergent CRRT given lab results as above.   Home Medications Prior to Admission medications   Medication Sig Start Date End Date Taking? Authorizing Provider  dicyclomine (BENTYL) 20 MG tablet Take 1 tablet (20 mg total) by mouth 2 (two) times daily. 11/29/23   Sabas Sous, MD  lisinopril (PRINIVIL,ZESTRIL) 10 MG tablet Take 1 tablet (10 mg total) by mouth daily. 02/08/18   Jacquelin Hawking, PA-C  loperamide (IMODIUM) 2 MG capsule Take 1 capsule (2 mg total) by mouth 4 (four) times daily as needed for diarrhea or loose stools. 11/29/23   Sabas Sous, MD  ondansetron (ZOFRAN-ODT) 4 MG disintegrating tablet Take 1 tablet (4 mg total) by mouth every 8 (eight) hours as needed for nausea or vomiting. 11/29/23   Sabas Sous,  MD   Past Medical History: Past Medical History:  Diagnosis Date   Anxiety    Asthma    Brain tumor (benign) (HCC)    during infancy   Chronic abdominal pain    Chronic back pain    COPD (chronic obstructive pulmonary disease) (HCC)    Depression    Gestational diabetes    Migraine    Past Surgical History: Past Surgical History:  Procedure Laterality Date   ABDOMINAL HYSTERECTOMY     APPENDECTOMY     BRAIN SURGERY     during infancy   CHOLECYSTECTOMY     FINGER SURGERY Right    index   TONSILLECTOMY     TUBAL LIGATION  01/1998   Family History: Family History  Problem Relation Age of Onset   Diabetes Mother    Heart disease Mother    Seizures Mother    Hypertension Mother    Cancer Father    Cancer Maternal Grandmother        throat and stomach   Cancer Maternal Grandfather        prostate and lung cancer   Emphysema Maternal Grandfather    Social History: Social History   Socioeconomic History   Marital status: Single    Spouse name: Not on file   Number of children: Not on file   Years of education: Not on file   Highest education level: Not on file  Occupational History   Not  on file  Tobacco Use   Smoking status: Former    Current packs/day: 0.50    Average packs/day: 0.5 packs/day for 27.0 years (13.5 ttl pk-yrs)    Types: Cigarettes   Smokeless tobacco: Never  Vaping Use   Vaping status: Every Day   Substances: Nicotine  Substance and Sexual Activity   Alcohol use: No   Drug use: No   Sexual activity: Never    Birth control/protection: Surgical  Other Topics Concern   Not on file  Social History Narrative   Not on file   Social Drivers of Health   Financial Resource Strain: Not on file  Food Insecurity: Not on file  Transportation Needs: Not on file  Physical Activity: Not on file  Stress: Not on file  Social Connections: Not on file   Allergies:  Allergies  Allergen Reactions   Bee Venom Anaphylaxis   Ketorolac Tromethamine  Anaphylaxis and Other (See Comments)    Seizure type spasms   Imitrex [Sumatriptan Base] Other (See Comments)    Chest pain   Isometheptene-Dichloral-Apap Other (See Comments)    Chest pain   Tramadol Other (See Comments)    Chest pain    Midodrine Rash   Objective:    Vital Signs:   Temp:  [96.4 F (35.8 C)] 96.4 F (35.8 C) (03/11 1000) Pulse Rate:  [84-88] 84 (03/11 1028) Resp:  [33-36] 34 (03/11 1105) BP: (69-132)/(46-70) 132/70 (03/11 1105) SpO2:  [94 %] 94 % (03/11 1028) Weight:  [90 kg] 90 kg (03/11 1020)   Weight change: Filed Weights   12/12/23 1020  Weight: 90 kg   Intake/Output:  Intake/Output Summary (Last 24 hours) at 12/12/2023 1310 Last data filed at 12/12/2023 1143 Gross per 24 hour  Intake 100 ml  Output --  Net 100 ml    Physical Exam    General: No distress on [place]. Generalized weakness. Cardiac: JVP not elevated. S1 and S2 present. No murmurs or rub. Extremities: Warm and dry.   Neuro: Alert and oriented x3. Affect pleasant.   Telemetry   ST in 100-110s (personally reviewed)  EKG    As above (personally reviewed)  Labs   Basic Metabolic Panel: Recent Labs  Lab 12/12/23 0959 12/12/23 1046  NA 131* 132*  K >7.5* 8.0*  CL 96* 104  CO2 13*  --   GLUCOSE 157* 143*  BUN 29* 37*  CREATININE 2.93* 2.80*  CALCIUM 7.2*  --    Liver Function Tests: Recent Labs  Lab 12/12/23 0959  AST 4,514*  ALT 4,322*  ALKPHOS 137*  BILITOT 0.7  PROT 6.6  ALBUMIN 3.2*   No results for input(s): "LIPASE", "AMYLASE" in the last 168 hours. No results for input(s): "AMMONIA" in the last 168 hours.  CBC: Recent Labs  Lab 12/12/23 0919 12/12/23 1046  WBC 25.5*  --   NEUTROABS 21.7*  --   HGB 13.8 12.9  HCT 44.4 38.0  MCV 99.8  --   PLT 187  --    BNP: BNP (last 3 results) No results for input(s): "BNP" in the last 8760 hours.  CBG: Recent Labs  Lab 12/12/23 0917  GLUCAP 153*   Coagulation Studies: Recent Labs    12/12/23 0919   LABPROT 25.1*  INR 2.2*   Imaging   CARDIAC CATHETERIZATION Result Date: 12/12/2023 1. Low filling pressures. 2. Normal PA pressure 3. Low but not markedly low cardiac output (CI 2.2 by Fick and thermo). 4. Mildly low PAPi   CT  ANGIO HEAD NECK W WO CM W PERF (CODE STROKE) Result Date: 12/12/2023 CLINICAL DATA:  Left-sided weakness EXAM: CT ANGIOGRAPHY HEAD AND NECK CT PERFUSION BRAIN TECHNIQUE: Multidetector CT imaging of the head and neck was performed using the standard protocol during bolus administration of intravenous contrast. Multiplanar CT image reconstructions and MIPs were obtained to evaluate the vascular anatomy. Carotid stenosis measurements (when applicable) are obtained utilizing NASCET criteria, using the distal internal carotid diameter as the denominator. Multiphase CT imaging of the brain was performed following IV bolus contrast injection. Subsequent parametric perfusion maps were calculated using RAPID software. RADIATION DOSE REDUCTION: This exam was performed according to the departmental dose-optimization program which includes automated exposure control, adjustment of the mA and/or kV according to patient size and/or use of iterative reconstruction technique. CONTRAST:  Dose is not known on this in progress study COMPARISON:  Head CT earlier today FINDINGS: CTA NECK FINDINGS Aortic arch: Atheromatous plaque with 3 vessel branching Right carotid system: Low-density wall thickening suggesting atherosclerosis. No ulceration or intimal flap Left carotid system: Mild generalized wall thickening. No ulceration or dissection flap Vertebral arteries: No proximal subclavian stenosis. Dominant left vertebral artery. No vertebral beading or flow reducing stenosis. Skeleton: No acute finding Other neck: Venous gas seen in the bilateral neck. Upper chest: Biapical emphysema. Review of the MIP images confirms the above findings CTA HEAD FINDINGS Anterior circulation: ~70% stenosis of the right  ICA at the lacernal segment . No major branch occlusion. No beading or aneurysm seen. Posterior circulation: Dominant left vertebral artery. The vertebral and basilar arteries are widely patent. No branch occlusion, beading, or aneurysm. Venous sinuses: Diffusely patent Anatomic variants: As above Review of the MIP images confirms the above findings CT Brain Perfusion Findings: ASPECTS: 10 CBF (<30%) Volume: 0 Perfusion (Tmax>6.0s) volume: 169 cc but highlighting the bilateral cerebral hemispheres in a fairly symmetric distribution suggesting global background cause or artifact. IMPRESSION: 1. No emergent large vessel occlusion. 2. 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 There is atherosclerosis but this would be an atypical location for focal atheromatous narrowing. No underlying dissection seen either, cause indeterminate. 3. No core infarct by CT perfusion. Generalized delayed perfusion suggests artifact or global/central cause. Electronically Signed   By: Tiburcio Pea M.D.   On: 12/12/2023 10:00   CT HEAD CODE STROKE WO CONTRAST Result Date: 12/12/2023 CLINICAL DATA:  Code stroke.  Left-sided weakness EXAM: CT HEAD WITHOUT CONTRAST TECHNIQUE: Contiguous axial images were obtained from the base of the skull through the vertex without intravenous contrast. RADIATION DOSE REDUCTION: This exam was performed according to the departmental dose-optimization program which includes automated exposure control, adjustment of the mA and/or kV according to patient size and/or use of iterative reconstruction technique. COMPARISON:  None Available. FINDINGS: Brain: No evidence of acute infarction, hemorrhage, hydrocephalus, extra-axial collection or mass lesion/mass effect. Vascular: No hyperdense vessel or unexpected calcification. Skull: Normal. Negative for fracture or focal lesion. Sinuses/Orbits: No acute finding. Other: Prelim sent in epic chat ASPECTS [facility] Stroke Program Early CT  Score) - Ganglionic level infarction (caudate, lentiform nuclei, internal capsule, insula, M1-M3 cortex): 7 - Supraganglionic infarction (M4-M6 cortex): 3 Total score (0-10 with 10 being normal): 10 IMPRESSION: 1. No acute finding. 2. ASPECTS is 10 Electronically Signed   By: Tiburcio Pea M.D.   On: 12/12/2023 09:35   Medications:    Current Medications:  [MAR Hold] aspirin  324 mg Oral Once   sodium zirconium cyclosilicate  10 g  Oral Once   Infusions:  sodium chloride     Patient Profile   Bonnie Jensen is a 47yo with a past medical history significant for mild intermittent asthma, recent relapse of opiate use (heroin, 12/02/2023 ED visit).   Assessment/Plan   Shock NSTEMI/ACS Lactic acidosis - unknown source, ?septic with leukocytosis of 25.5 ?hypovolemic. Needs infectious work up. - hypotensive on admission. hsTrop 11K>15K. LA 8.5.  - with lab presentation, ?rhabdomyolosis (would check CK and UDS) - Emergent RHC: RA 5, PAP 22/13 (8), PCWP 2, CO/CI (TD) 4.07/2.19, PAPi 1.8. Not in CGS base on cath.  - POCUS in cath lab showed LV preserved, however RV dilated - May need CTPE to rule out once more stable, on 2L [place] - Complete echo ordered - start NS 75/hr  Acute Kidney Injury Hyperkalemia Hypocalcemia - K is 8.0, iCal 0.82 - replete Ca as able - Nephrology emergently consulted for possible CRRT  CVA - ASPECT score 10 - CT head no large vessel occlusion, flow reducing stenosis in R ICA. Eventually may need VVS eval.  - seen by neurology  Length of Stay: 0  CRITICAL CARE Performed by: [place] Lee  Total critical care time: 18 minutes  Critical care time was exclusive of separately billable procedures and treating other patients.  Critical care was necessary to treat or prevent imminent or life-threatening deterioration.  Critical care was time spent personally by me on the following activities: development of treatment plan with patient and/or surrogate as well as  nursing, discussions with consultants, evaluation of patient's response to treatment, examination of patient, obtaining history from patient or surrogate, ordering and performing treatments and interventions, ordering and review of laboratory studies, ordering and review of radiographic studies, pulse oximetry and re-evaluation of patient's condition.  [place] Lee, NP  12/12/2023, 1:10 PM  Advanced Heart Failure Team Pager [phone] (M-F; 7a - 5p)  Please contact [facility] for night-coverage after hours (4p -7a ) and weekends on [email or web address]  Patient seen with NP, I formulated plan and agree with the above note.   History as noted above.  Patient presented to [facility] ER with a myriad of complaints, left-sided weakness/slurred speech, back pain, abdominal pain and diarrhea, chest pain.  She recently was in the ER on 3/1 and received Narcan for heroin overdose.  She has been taking frequent Goody's powders for pain.   Initially code stroke was called, CTA head/neck and CT head w/o contrast were unrmarkable.  Labs returned with multiple abnormalities, Lactate 8.5, HS-TnI up to 15,776, K >7.5 with creatinine up to 2.93 from normal baseline, WBCs 20.  UDS was negative.  Initial ECG also showed anterior ST elevation in setting of hyperkalemia. Followup ECG after treatment of K showed resolution of ST elevation back to baseline.   She was transferred to [facility]  With AKI and resolution of ST elevation, decision made to proceed with RHC only and Swan was placed.  RHC as below:  RHC Procedural Findings: Hemodynamics (mmHg) RA mean 5 RV 28/10 PA 22/13, mean 18 PCWP mean 2 Oxygen saturations: PA 66% AO 100% Cardiac Output (Fick) 4.12  Cardiac Index (Fick) 2.21 Cardiac Output (Thermo) 4.07  Cardiac Index (Thermo) 2.19 PAPi 1.8   Echo was also done, showing EF 70-75%, mild RV enlargement with normal systolic function, normal valves, normal IVC.   General: uncomfortable Neck: No JVD, no thyromegaly or  thyroid nodule.  Lungs: Clear to auscultation bilaterally with normal respiratory effort. CV: Nondisplaced PMI.  Heart regular S1/S2, no  S3/S4, no murmur.  No peripheral edema.  No carotid bruit.  Normal pedal pulses.  Abdomen: Soft, mild diffuse tenderness, no hepatosplenomegaly, no distention.  Skin: Intact without lesions or rashes.  Neurologic: Alert and oriented x 3.  Psych: Normal affect. Extremities: No clubbing or cyanosis.  HEENT: Normal.   1. CAD: Initial anterior STE with resolution after treatment of hyperkalemia.  However, HS-TnI elevated to 15,776.  LV EF vigorous by echo with no wall motion abnormalities. Chest pain began a couple of days prior to admission, ?late presentation MI though as noted, no wall motion abnormalities.  However, TnI is markedly high for demand ischemia.  - Heparin gtt - ASA 81 - Avoid statin for now with elevated LFTs.  - Would aim for coronary angiography eventually but not yet with AKI.  - Workup for PE as cause of shock + elevated TnI => covering with heparin for now, would not give further contrast at this time for CTA chest, can check venous dopplers.  2. Shock: Relatively mild, never required pressors.  However, lactate up to 8.5 with markedly elevated LFTs.  She has significant abdominal pain with diarrhea, ?GI pathology/ischemic bowel as trigger to presentation.  - Needs abdominal/pelvis CT.  - With low CVP and PCWP, will hydrate with LR.  - Check procalcitonin.  3. Elevated LFTs: Shock liver-type presentation.   - Abdominal CT ordered.  - trend.  4. AKI: Baseline creatinine 0.7, up to 3.16 today with hyperkalemia and metabolic acidosis. ?Drug overdose and down unconscious with rhabdomyolysis given recent presentation to ER heroin overdose with Narcan required.  However, UDS negative. Also has history of extensive Goody's powder use.  - Check CK, ?rhabdo as cause of AKI.  - IVF resuscitation with LR - HCO3 gtt - No Lasix at this time with low  filling pressures.  - Repeat K sent, will need to treat hyperK with Lokelma, insulin/D50.   CRITICAL CARE Performed by: Marca Ancona  Total critical care time: 60 minutes  Critical care time was exclusive of separately billable procedures and treating other patients.  Critical care was necessary to treat or prevent imminent or life-threatening deterioration.  Critical care was time spent personally by me on the following activities: development of treatment plan with patient and/or surrogate as well as nursing, discussions with consultants, evaluation of patient's response to treatment, examination of patient, obtaining history from patient or surrogate, ordering and performing treatments and interventions, ordering and review of laboratory studies, ordering and review of radiographic studies, pulse oximetry and re-evaluation of patient's condition.  Marca Ancona 12/12/2023 3:51 PM

## 2023-12-12 NOTE — Progress Notes (Signed)
 PHARMACY - ANTICOAGULATION CONSULT NOTE  Pharmacy Consult for heparin  Indication: chest pain/ACS  Allergies  Allergen Reactions   Bee Venom Anaphylaxis   Ketorolac Tromethamine Anaphylaxis and Other (See Comments)    Seizure type spasms   Imitrex [Sumatriptan Base] Other (See Comments)    Chest pain   Isometheptene-Dichloral-Apap Other (See Comments)    Chest pain   Tramadol Other (See Comments)    Chest pain    Midodrine Rash    Patient Measurements: Height: 5' (152.4 cm) Weight: 90 kg (198 lb 6.6 oz) IBW/kg (Calculated) : 45.5 Heparin Dosing Weight: 67kg  Vital Signs: Temp: 96.4 F (35.8 C) (03/11 1000) Temp Source: Axillary (03/11 1000) BP: 69/55 (03/11 1028) Pulse Rate: 84 (03/11 1028)  Labs: Recent Labs    12/12/23 0919 12/12/23 0955 12/12/23 1046  HGB 13.8  --  12.9  HCT 44.4  --  38.0  PLT 187  --   --   APTT 44*  --   --   LABPROT 25.1*  --   --   INR 2.2*  --   --   CREATININE  --   --  2.80*  TROPONINIHS  --  11,909*  --     Estimated Creatinine Clearance: 24.8 mL/min (A) (by C-G formula based on SCr of 2.8 mg/dL (H)).   Medical History: Past Medical History:  Diagnosis Date   Anxiety    Asthma    Brain tumor (benign) (HCC)    during infancy   Chronic abdominal pain    Chronic back pain    COPD (chronic obstructive pulmonary disease) (HCC)    Depression    Gestational diabetes    Migraine    Assessment: 48 year old female admitted for possible stroke. Patient now with elevated troponin >11000. New orders to start heparin for stemi with plan for transfer to [facility] for workup.   INR 2.2, she does not appear to be on blood thinners prior to admit, LFTs are greater than 4000. Patient appears to be in renal failure with potassium 7-8 receiving treatment in ED. CBC fortunately appears within normal limits.   Heparin bolus already given in ED, will use stroke protocol for heparin dosing going forward.   Goal of Therapy:  Heparin level 0.3-0.5  units/ml Monitor platelets by anticoagulation protocol: Yes   Plan:  Start heparin infusion at 800 units/hr Check anti-Xa level in 6 hours unless she goes to cath Continue to monitor H&H and platelets  Sheppard Coil PharmD., BCPS Clinical Pharmacist 12/12/2023 11:14 AM

## 2023-12-12 NOTE — Procedures (Signed)
 Central Venous Catheter Insertion Procedure Note  KEYANAH KOZICKI  [id number]  [birth year]  Date:12/12/23  Time:6:04 PM   Provider Performing:Deandrae Wajda Bea Laura  Cherlynn Polo   Procedure: Insertion of Non-tunneled Central Venous Catheter(36556)with US guidance (40981)    Indication(s) Medication administration, Difficult access, and Hemodialysis  Consent Risks of the procedure as well as the alternatives and risks of each were explained to the patient and/or caregiver.  Consent for the procedure was obtained and is signed in the bedside chart  Anesthesia Topical only with 1% lidocaine   Timeout Verified patient identification, verified procedure, site/side was marked, verified correct patient position, special equipment/implants available, medications/allergies/relevant history reviewed, required imaging and test results available.  Sterile Technique Maximal sterile technique including full sterile barrier drape, hand hygiene, sterile gown, sterile gloves, mask, hair covering, sterile ultrasound probe cover (if used).  Procedure Description Area of catheter insertion was cleaned with chlorhexidine and draped in sterile fashion.   With real-time ultrasound guidance a HD catheter was placed into the left internal jugular vein.  Nonpulsatile blood flow and easy flushing noted in all ports.  The catheter was sutured in place and sterile dressing applied.  Complications/Tolerance None; patient tolerated the procedure well. Chest X-ray is ordered to verify placement for internal jugular or subclavian cannulation.  Chest x-ray is not ordered for femoral cannulation.  EBL Minimal  Specimen(s) None   Cristopher Peru, PA-C [facility] 12/12/23 6:04 PM  Please see [email or web address] for pager details.  From 7A-7P if no response, please call [phone] After hours, please call ELink [phone]

## 2023-12-12 NOTE — Consult Note (Signed)
 Bonnie Jensen May 22, 1976  [id number].    Requesting MD: Dr. Myrla Halsted Chief Complaint/Reason for Consult: lactic acidosis, abdominal pain  HPI:  Bonnie Jensen is a 48 yo female who presented to the ED this morning after being found unresponsive. She initially had left sided weakness and a code stroke was called, and workup was negative. She had improvement in mentation after receiving narcan, and reported chest discomfort. EKG was concerning for a STEMI, so she want to the cath lab. Echo showed a normal LV with dilation of the RV. RHC showed a mildly low cardiac output and normal PA pressure. She has been admitted to the ICU with hypotension, AKI, and metabolic acidosis. Lactate is elevated to 8. She is complaining of severe diffuse abdominal pain. She reports intermittent abdominal pain for the last 2 weeks. It does not seem to be associated with eating. She has had diarrhea but no blood in her stools. No vomiting. She uses Goody powder a minimum of 2 times a day, and often more frequently than that. She has been doing this for the last few months. She denies any other NSAID use.  Prior abdominal surgeries include a lap chole, appendectomy, and hysterectomy. 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  ROS: Review of Systems  Constitutional:  Positive for malaise/fatigue. Negative for chills and fever.  Respiratory:  Positive for shortness of breath.   Cardiovascular:  Positive for chest pain.  Gastrointestinal:  Positive for abdominal pain and diarrhea. Negative for blood in stool.    Family History  Problem Relation Age of Onset   Diabetes Mother    Heart disease Mother    Seizures Mother    Hypertension Mother    Cancer Father    Cancer Maternal Grandmother        throat and stomach   Cancer Maternal Grandfather        prostate and lung cancer   Emphysema Maternal Grandfather     Past Medical History:  Diagnosis Date    Anxiety    Asthma    Brain tumor (benign) (HCC)    during infancy   Chronic abdominal pain    Chronic back pain    COPD (chronic obstructive pulmonary disease) (HCC)    Depression    Gestational diabetes    Migraine     Past Surgical History:  Procedure Laterality Date   ABDOMINAL HYSTERECTOMY     APPENDECTOMY     BRAIN SURGERY     during infancy   CHOLECYSTECTOMY     FINGER SURGERY Right    index   TONSILLECTOMY     TUBAL LIGATION  01/1998    Social History:  reports that she has quit smoking. Her smoking use included cigarettes. She has a 13.5 pack-year smoking history. She has never used smokeless tobacco. She reports that she does not drink alcohol and does not use drugs.  Allergies:  Allergies  Allergen Reactions   Bee Venom Anaphylaxis   Ketorolac Tromethamine Anaphylaxis and Other (See Comments)    Seizure type spasms   Imitrex [Sumatriptan Base] Other (See Comments)    Chest pain   Isometheptene-Dichloral-Apap Other (See Comments)    Chest pain   Tramadol Other (See Comments)    Chest pain    Midodrine Rash    No medications prior to admission.     Physical Exam: Blood pressure 132/70, pulse (!) 112, temperature (!) 97 F (36.1 C), resp.  rate (!) 24, height 5' (1.524 m), weight 90 kg, last menstrual period 07/16/2018, SpO2 97%. General: alert, appears uncomfortable Neurological: alert and oriented, GCS 15 HEENT: normocephalic, atraumatic, CVC in place R neck CV: tachycardic, regular Respiratory: increased work of breathing Abdomen: mildly distended and tense, diffusely tender but no rebound tenderness or guarding. Extremities: warm and well-perfused, moving all extremities spontaneously      Assessment/Plan 48 yo female presenting after being found unresponsive, with acute renal failure, lactic acidosis and abdominal pain. Findings on RHC and echo do not explain her elevated lactate, and a source of sepsis has not yet been identified. CXR this  morning did not show free air under the diaphragm. She does not have frank peritonitis on exam, but she is in significant pain, which is concerning in the setting of her lactic acidosis. Her history is concerning for a perforated gastric ulcer given her chronic NSAID use, but there are multiple other possibilities to explain her pain. Discussed with CCM (Dr. Katrinka Blazing). Patient is to be intubated and will then proceed with a CT scan. Prior to this, will obtain a KUB at the time of post-intubation CXR to rule out obvious pneumoperitoneum, in which case we would proceed directly to surgery. I discussed with the patient the possibility of an emergent surgical intervention pending the results of her imaging. Even if she does not have pneumoperitoneum or signs of bowel ischemia, it may be necessary to perform a diagnostic laparoscopy to exclude an intraabdominal source, if no other abnormalities are identified on imaging to explain her lactic acidosis. Will follow up imaging, discussed with oncoming call surgeon Dr. Dossie Der.   Sophronia Simas, MD [facility] Surgery General, Hepatobiliary and Pancreatic Surgery 12/12/23 4:48 PM

## 2023-12-13 ENCOUNTER — Encounter (HOSPITAL_COMMUNITY): Payer: Self-pay | Admitting: Cardiology

## 2023-12-13 DIAGNOSIS — K559 Vascular disorder of intestine, unspecified: Secondary | ICD-10-CM

## 2023-12-13 LAB — PREPARE FRESH FROZEN PLASMA: Unit division: 0

## 2023-12-13 LAB — PREPARE CRYOPRECIPITATE: Unit division: 0

## 2023-12-13 LAB — CBC
HCT: 32.5 % — ABNORMAL LOW (ref 36.0–46.0)
Hemoglobin: 10.4 g/dL — ABNORMAL LOW (ref 12.0–15.0)
MCH: 31.4 pg (ref 26.0–34.0)
MCHC: 32 g/dL (ref 30.0–36.0)
MCV: 98.2 fL (ref 80.0–100.0)
Platelets: 105 10*3/uL — ABNORMAL LOW (ref 150–400)
RBC: 3.31 MIL/uL — ABNORMAL LOW (ref 3.87–5.11)
RDW: 14 % (ref 11.5–15.5)
WBC: 11.9 10*3/uL — ABNORMAL HIGH (ref 4.0–10.5)
nRBC: 1.5 % — ABNORMAL HIGH (ref 0.0–0.2)

## 2023-12-13 LAB — BPAM FFP
Blood Product Expiration Date: 202503152359
Blood Product Expiration Date: 202503152359
Blood Product Expiration Date: 202503152359
ISSUE DATE / TIME: 202503112100
ISSUE DATE / TIME: 202503112100
ISSUE DATE / TIME: 202503112100
Unit Type and Rh: 6200
Unit Type and Rh: 6200
Unit Type and Rh: 6200

## 2023-12-13 LAB — BPAM CRYOPRECIPITATE
Blood Product Expiration Date: 202503152359
ISSUE DATE / TIME: 202503112100
Unit Type and Rh: 9500

## 2023-12-13 LAB — HEPARIN LEVEL (UNFRACTIONATED): Heparin Unfractionated: <0.1 [IU]/mL — ABNORMAL LOW (ref 0.30–0.70)

## 2023-12-13 MED ORDER — GLYCOPYRROLATE 0.2 MG/ML IJ SOLN: 0.2000 mg | INTRAMUSCULAR | Status: DC | PRN

## 2023-12-13 MED ORDER — CHLORHEXIDINE GLUCONATE CLOTH 2 % EX PADS: 6.0000 | MEDICATED_PAD | Freq: Every day | CUTANEOUS | Status: DC

## 2023-12-13 MED ORDER — GLYCOPYRROLATE 0.2 MG/ML IJ SOLN
0.2000 mg | INTRAMUSCULAR | Status: DC | PRN
  Administered 2023-12-13: 0.2 mg via INTRAVENOUS
  Filled 2023-12-13: qty 1

## 2023-12-13 MED ORDER — FENTANYL CITRATE PF 50 MCG/ML IJ SOSY
50.0000 ug | PREFILLED_SYRINGE | INTRAMUSCULAR | Status: DC | PRN
  Administered 2023-12-13: 50 ug via INTRAVENOUS
  Filled 2023-12-13: qty 2

## 2023-12-13 MED ORDER — SODIUM CHLORIDE 0.9% FLUSH: 10.0000 mL | Freq: Two times a day (BID) | INTRAVENOUS | Status: DC

## 2023-12-13 MED ORDER — ACETAMINOPHEN 325 MG PO TABS: 650.0000 mg | ORAL_TABLET | Freq: Four times a day (QID) | ORAL | Status: DC | PRN

## 2023-12-13 MED ORDER — SODIUM CHLORIDE 0.9% FLUSH: 10.0000 mL | INTRAVENOUS | Status: DC | PRN

## 2023-12-13 MED ORDER — GLYCOPYRROLATE 1 MG PO TABS: 1.0000 mg | ORAL_TABLET | ORAL | Status: DC | PRN

## 2023-12-13 MED ORDER — POLYVINYL ALCOHOL 1.4 % OP SOLN: 1.0000 [drp] | Freq: Four times a day (QID) | OPHTHALMIC | Status: DC | PRN

## 2023-12-13 MED ORDER — ACETAMINOPHEN 650 MG RE SUPP
650.0000 mg | Freq: Four times a day (QID) | RECTAL | Status: DC | PRN
Start: 2023-12-13 — End: 2023-12-13

## 2023-12-15 LAB — SURGICAL PATHOLOGY

## 2023-12-19 LAB — TROPONIN I (HIGH SENSITIVITY): Troponin I (High Sensitivity): 15776 ng/L (ref <18)

## 2023-12-19 NOTE — Anesthesia Postprocedure Evaluation (Signed)
 Anesthesia Post Note  Patient: Bonnie Jensen  Procedure(s) Performed: Exploratory laparotomy Small bowel resection Partial colectomy (right and transverse) Temporary abdominal closure with abthera wound vac (Abdomen)     Patient location during evaluation: SICU Anesthesia Type: General Level of consciousness: sedated Pain management: pain level controlled Vital Signs Assessment: vitals unstable Respiratory status: patient remains intubated per anesthesia plan Postop Assessment: no apparent nausea or vomiting Anesthetic complications: no   No notable events documented.               Symia Herdt

## 2024-01-02 NOTE — Progress Notes (Signed)
 PCCM Brief Note  Discussed OR findings with Dr. Dossie Der earlier in the evening. Met with family later in waiting area and explained the gravity of things. Family aware that this is not surviveable. Daughter has not yet had a chance to visit and I told remaining family I would follow up in an hour once she arrives. I did follow up but daughter still not here. I spoke with her over the phone and unsure she knows the extent of pt's situation. She is roughly 45 minutes out from the hospital.  Revisited with POA Marvis Moeller who felt it would be easier on family to wait on transitioning from comfort care until the morning. He will discuss with daughter Baxter Hire once she arrives however and if at that time they are in fact ready, they will let RN known to ask [facility] for comfort orders. If not ready tonight, day team to revisit in AM.   Rutherford Guys, PA - C [facility] For pager details, please see AMION or use Epic chat  After 1900, please call [facility] for cross coverage needs 12/30/2023, 12:15 AM

## 2024-01-02 NOTE — IPAL (Signed)
  Interdisciplinary Goals of Care Family Meeting   Date carried out: 12/20/2023  Location of the meeting: Conference room  Member's involved: Family Member or next of kin, Tourist information centre manager Power of Attorney or acting medical decision maker: Daughter Baxter Hire and significant other Marvis Moeller    Discussion: We discussed goals of care for [employer] and after an extensive discussion with the family regarding the current circumstances, poor prognosis, and likely poor quality of life for Ms. Devine, the family has decided to offer full comfort care for her. They have been fully updated on the process and expectations and all questions have been answered.   Code status:   Code Status: Do not attempt resuscitation (DNR) PRE-ARREST INTERVENTIONS DESIRED   Disposition: In-patient comfort care  Time spent for the meeting: 15 minutes.    Rutherford Guys, PA-C  12/20/2023, 1:37 AM

## 2024-01-02 NOTE — Progress Notes (Signed)
 Chaplain called to offer spiritual care and support to family who has just made decision to move pt to comfort care.  Chaplain engaged in ministry of presence as daughter and other family members spoke of how sudden pt had fallen ill and has now died, all within 24 hours.  Family is shocked and mournful. Chaplain supported family and prayed with them in family waiting area.  Chaplain present with pt's brother. As Pt was removed from ventilator her brother spoke of her strong faith and her confidence she would be ready to meet the Lord whenever he was ready for her to go home.  Chaplain escorted daughter back into pt bedside.  Chaplain available for additional support.  Vernell Morgans Chaplalin

## 2024-01-02 NOTE — Death Summary Note (Signed)
 DEATH SUMMARY   Patient Details  Name: Bonnie Jensen MRN: [id number] DOB: 1976-04-16  Admission/Discharge Information   Admit Date:  18-Dec-2023  Date of Death: Date of Death: 12-19-23  Time of Death: Time of Death: 0217  Length of Stay: 1  Referring Physician: Pcp, No   Reason(s) for Hospitalization  Shock, hypotension, sepsis, acute kidney and liver failure, NSTEMI  Diagnoses  Preliminary cause of death:  Secondary Diagnoses (including complications and co-morbidities):  Principal Problem:   Renal failure Active Problems:   Ischemic colitis [facility])   Brief Hospital Course (including significant findings, care, treatment, and services provided and events leading to death)  Bonnie Jensen is a 48 y.o. year old female who initially presented to [facility] ED on 12/18/2023 AM as code stroke for unresponsiveness. LKW 7PM 3/10. Per EDP note, arrived altered, unable to move left side. She had negative brain imaging. She received 4mg  narcan with improvement in mentation. Code sepsis was also initiated due to hypotension, AMS. She was complaining of chest discomfort when she awake and EKG showed anterior STEMI pattern. Cardiology was consulted and patient was transferred to [facility] for cath lab. Labs at [facility] WBC 25.5, resp panel negative, troponin 11k, Na 131, K >7.5, bicarb 13, BUN 29, sCr 2.93, AST 4514, ALT 4322, alk phos 137, AG 22, UDS negative, UA negative for UTI. She was given bicarb and 2 amps of calcium gluconate to help temporize K. Started on heparin gtt and sent to [facility] cath lab. In cath lab, echo with okay LV, RV dilated. RA 5, PAP 22/13( 8), PCWP 2, CO/CI 4.07/2.19, PAPi 1.8. Transferred to CVICU post cath lab with swan. CCM consulted for assistance in management.    On arrival to ICU the patient was on nasal cannula. Awake, alert and oriented. Not on vasopressors. She had persistent lactic acidosis requiring a bicarb drip. She had ongoing troponinemia up to 15k which was though to perhaps have  been MI from days prior leading to low flow state and subsequent findings outlined below. Additionally, with worsening shock, had shock liver and acute kidney injury with hyperkalemia which was temporized multiple times with bicarb, calcium gluconate and insulin/dextrose. Throughout the late morning and early afternoon she continued to complain of worsening abdominal pain. Out of concern for possible ischemic gut, general surgery was consulted and came to evaluate patient at bedside. Surgeon recommended advanced imaging and possible diagnostic laparoscopy if she did not improve. Patient had worsening status requiring vasopressor initiation. It was decided to electively intubate the patient to facilitate safely obtaining CT imaging and intervention. She was intubated and a central line and arterial line were placed for advanced monitoring and vasopressor requirements. She subsequently went for CT angiogram dissection study of the chest abdomen and pelvis. CT showed extensive pneumatosis involving all the small bowel and large bowel. Extensive mesenteric venous gas and portal venous gas all favoring extensive bowel ischemia. Surgeon, Dr. Dossie Der evaluated the patient and discussed option to proceed with exploratory laparotomy and possibility of need to transition to comfort care depending on findings. Family agreed. She was then taken emergently to the OR and found to have extensive mesenteric ischemic with non-viable changes from proximal jejunum to splenic flexure of the colon leaving around 60cm of small intestine and descending colon which is not viable to maintain hydration or have a recovery. Dr. Dossie Der discussed findings with family and recommended comfort measures which were accepted by family. Patient was made DNR-Comfort and passed at 0217.   Pertinent  Labs and Studies  Significant Diagnostic Studies CT Angio Chest/Abd/Pel for Dissection W and/or W/WO Addendum Date: 12/12/2023 ADDENDUM  REPORT: 12/12/2023 20:23 ADDENDUM: These results were discussed by telephone at the time of interpretation on 12/12/2023 at 8:00pm to provider [facility] , who verbally acknowledged these results. Electronically Signed   By: Helyn Numbers M.D.   On: 12/12/2023 20:23   Result Date: 12/12/2023 CLINICAL DATA:  Acute aortic syndrome, sepsis EXAM: CT ANGIOGRAPHY CHEST, ABDOMEN AND PELVIS TECHNIQUE: Non-contrast CT of the chest was initially obtained. Multidetector CT imaging through the chest, abdomen and pelvis was performed using the standard protocol during bolus administration of intravenous contrast. Multiplanar reconstructed images and MIPs were obtained and reviewed to evaluate the vascular anatomy. RADIATION DOSE REDUCTION: This exam was performed according to the departmental dose-optimization program which includes automated exposure control, adjustment of the mA and/or kV according to patient size and/or use of iterative reconstruction technique. CONTRAST:  75mL OMNIPAQUE IOHEXOL 350 MG/ML SOLN COMPARISON:  CT abdomen pelvis 11/29/2023 FINDINGS: CTA CHEST FINDINGS Cardiovascular: No significant coronary artery calcification. Global cardiac size within normal limits. No pericardial effusion. Right internal jugular Swan-Ganz catheter tip is within a segmental pulmonary arterial branch of the right middle lobe roughly 4 cm distal to the main right pulmonary artery. Left internal jugular hemodialysis catheter tip seen within right atrium. The central pulmonary arteries are of normal caliber. The thoracic aorta is normal in course and caliber. No intramural hematoma, dissection, aneurysm. Arch vasculature demonstrates classic anatomy but is widely patent proximally. The peripheral arch vasculature is of narrow caliber diffusely likely related to diffuse vasoconstrictor in the setting sepsis or hypotension. Mediastinum/Nodes: Endotracheal tube seen 17 mm above the carina. Nasogastric tube tip seen within the  distal stomach. Fluid is seen within the esophagus likely related to gastroesophageal reflux. No pathologic adenopathy within the thorax. Visualized thyroid is unremarkable. Lungs/Pleura: Mild emphysema. Mild bibasilar dependent atelectasis. No focal consolidation. No pneumothorax or pleural effusion. Musculoskeletal: No chest wall abnormality. No acute or significant osseous findings. Review of the MIP images confirms the above findings. CTA ABDOMEN AND PELVIS FINDINGS VASCULAR Aorta: Normal caliber aorta without aneurysm, dissection, vasculitis or significant stenosis. Mild atherosclerotic calcification Celiac: The celiac axis is diffusely of diminutive caliber in keeping with diffuse vasoconstriction as can be seen in sepsis or intravascular depletion/hypotension. No hemodynamically significant stenosis noted at the origin. SMA: The superior mesenteric artery is widely patent at its origin but again demonstrates diffuse space of constriction. Renals: The renal arteries are patent but demonstrate diffuse vasoconstriction. No focal stenosis identified. IMA: The inferior mesenteric artery is not opacified but was patent on examination of 11/29/2023 and likely is non-opacified related to extreme vasoconstriction Inflow: Markedly vasoconstricted.  No focal stenosis. Veins: The inferior vena cava and common iliac veins are slit-like in keeping with intravascular depletion. Review of the MIP images confirms the above findings. NON-VASCULAR Hepatobiliary: Moderate portal venous gas within the non dependent left portal branches. No definite focal intrahepatic mass identified. Status post cholecystectomy. Pancreas: Unremarkable Spleen: Unremarkable Adrenals/Urinary Tract: The adrenal glands are unremarkable. There is heterogeneous cortical enhancement of the kidneys bilaterally, new since prior examination. While this may relate to extreme base of constriction and resultant altered segmental perfusion, inflammatory  conditions such as pyelonephritis may appear similarly. No hydronephrosis. No intrarenal or ureteral calculi. The bladder is decompressed with a Foley catheter balloon seen within its lumen. Stomach/Bowel: There is extensive pneumatosis involving nearly all of the small bowel and large bowel in  the distribution of the superior mesenteric artery and inferior mesenteric artery. Extensive mesenteric venous gas and, as noted above portal venous gas. The findings are in keeping with extensive bowel ischemia. The distribution favors nonocclusive mesenteric ischemia as an underlying etiology, particular given patency the central mesenteric vasculature and lack of significant atherosclerotic disease. Small hemoperitoneum is present. No free intraperitoneal gas. Moderate sigmoid diverticulosis incidentally noted. Lymphatic: No pathologic adenopathy within the abdomen and pelvis. Reproductive: Status post hysterectomy. No adnexal masses. Other: No abdominal wall hernia Musculoskeletal: Degenerative changes are seen within lumbar spine. Acute. Review of the MIP images confirms the above findings. IMPRESSION: 1. Extensive pneumatosis involving nearly all of the small bowel and large bowel in the distribution of the superior mesenteric artery and inferior mesenteric artery. Extensive mesenteric venous gas and portal venous gas. The findings are in keeping with extensive bowel ischemia. The distribution favors nonocclusive mesenteric ischemia as an underlying etiology, particular given patency the central mesenteric vasculature and lack of significant atherosclerotic disease. An root cause, however, is not clearly identified this examination and differential considerations could include drug reaction or acute gastrointestinal hemorrhage which would not necessarily demonstrate findings on this examination. 2. Small hemoperitoneum. 3. Diffuse vasoconstriction involving the celiac axis, superior mesenteric artery, renal arteries,  and inflow vessels as can be seen in sepsis or intravascular depletion/hypotension. Slit-like central venous structures favor intravascular depletion. 4. Heterogeneous cortical enhancement of the kidneys bilaterally, new since prior examination. While this may relate to extreme base of constriction and resultant altered segmental perfusion, inflammatory conditions such as pyelonephritis may appear similarly. Correlation with urinalysis may be helpful. 5. Right internal jugular Swan-Ganz catheter tip is within a segmental pulmonary arterial branch of the right middle lobe roughly 4 cm distal to the main right pulmonary artery. Aortic Atherosclerosis (ICD10-I70.0) and Emphysema (ICD10-J43.9). Electronically Signed: By: Helyn Numbers M.D. On: 12/12/2023 20:19   DG Chest Port 1 View Result Date: 12/12/2023 CLINICAL DATA:  Respiratory failure EXAM: PORTABLE CHEST 1 VIEW COMPARISON:  None Available. FINDINGS: Endotracheal tube seen 15 mm above the carina. Nasogastric tube extends into the upper abdomen beyond the margin of the examination. Right internal jugular Swan-Ganz catheter seen peripherally within the right pulmonary artery, likely within a lobar branch. Left internal jugular hemodialysis catheter tip seen within the right atrium. Lung volumes are small and there is vascular crowding at the hila and left basilar atelectasis. No pneumothorax or pleural effusion. Cardiac size within normal limits. IMPRESSION: 1. Right internal jugular Swan-Ganz catheter peripherally located within the right pulmonary tree. Withdrawal approximately 4 cm may optimally position the device. 2. Otherwise, support lines and tubes in appropriate position. 3. Pulmonary hypoinflation. Electronically Signed   By: Helyn Numbers M.D.   On: 12/12/2023 20:21   DG Abd Portable 1V Result Date: 12/12/2023 CLINICAL DATA:  Abdominal pain. EXAM: PORTABLE ABDOMEN - 1 VIEW COMPARISON:  10/28/2013. FINDINGS: There suspected loops of distended  small bowel in the upper abdomen, only partially visualized on limited view. Cholecystectomy clips are noted in the right upper quadrant. There is mild atelectasis at the left lung base. A Swan-Ganz catheter is noted. IMPRESSION: Suspected loops of distended small bowel in the upper abdomen, partially visualized on limited view. Dedicated abdominal radiographs are recommended. Electronically Signed   By: Thornell Sartorius M.D.   On: 12/12/2023 19:21   US Abdomen Complete Result Date: 12/12/2023 CLINICAL DATA:  Abdominal distention. EXAM: ABDOMEN ULTRASOUND COMPLETE COMPARISON:  Ultrasound 2015 April.  CT 11/29/2023. FINDINGS: Gallbladder: Surgically absent  as per history. Common bile duct: Diameter: Obscured by overlapping bowel gas. Liver: Diffusely echogenic hepatic parenchyma consistent with fatty liver infiltration. Portal vein is patent on color Doppler imaging with normal direction of blood flow towards the liver. IVC: Obscured by bowel gas. Pancreas: Obscured by bowel gas. Spleen: Partially obscured by bowel gas.  Grossly nonenlarged. Right Kidney: Length: 9.7 cm. Echogenicity within normal limits. No mass or hydronephrosis visualized. Left Kidney: Length: 11.7 cm. Echogenicity within normal limits. No mass or hydronephrosis visualized. Prominent column of Bertin. Abdominal aorta: Poorly seen with overlapping bowel gas and soft tissue. Other findings: None. IMPRESSION: Evaluation markedly limited by overlapping bowel gas and soft tissue. Prior cholecystectomy. The common duct is not seen. There was ductal dilatation on prior CT. Please correlate with known history and prior reports. Fatty liver infiltration. Overall with limitations state dish in all evaluation as clinically appropriate such as MRI. Electronically Signed   By: Karen Kays M.D.   On: 12/12/2023 17:14   ECHOCARDIOGRAM COMPLETE Result Date: 12/12/2023    ECHOCARDIOGRAM REPORT   Patient Name:   Aariya CHANTILLE NAVARRETE Date of Exam: 12/12/2023 Medical  Rec #:  [id number]      Height:       60.0 in Accession #:    [id number]     Weight:       198.4 lb Date of Birth:  [birth year]      BSA:          1.860 m Patient Age:    47 years       BP:           115/99 mmHg Patient Gender: F              HR:           112 bpm. Exam Location:  Inpatient Procedure: 2D Echo, Color Doppler and Cardiac Doppler (Both Spectral and Color            Flow Doppler were utilized during procedure). Indications:    CHF-Acute Systolic I50.21  History:        Patient has no prior history of Echocardiogram examinations.                 COPD.  Sonographer:    Darlys Gales Referring Phys: [phone] DALTON S MCLEAN IMPRESSIONS  1. Left ventricular ejection fraction, by estimation, is 70 to 75%. The left ventricle has hyperdynamic function. The left ventricle has no regional wall motion abnormalities. Left ventricular diastolic parameters are consistent with Grade I diastolic dysfunction (impaired relaxation).  2. Right ventricular systolic function is normal. The right ventricular size is mildly enlarged. Tricuspid regurgitation signal is inadequate for assessing PA pressure.  3. The mitral valve is normal in structure. No evidence of mitral valve regurgitation. No evidence of mitral stenosis.  4. The aortic valve was not well visualized. Aortic valve regurgitation is not visualized. No aortic stenosis is present. Aortic valve mean gradient measures 10.0 mmHg.  5. The inferior vena cava is normal in size with greater than 50% respiratory variability, suggesting right atrial pressure of 3 mmHg. FINDINGS  Left Ventricle: Left ventricular ejection fraction, by estimation, is 70 to 75%. The left ventricle has hyperdynamic function. The left ventricle has no regional wall motion abnormalities. The left ventricular internal cavity size was normal in size. There is no left ventricular hypertrophy. Left ventricular diastolic parameters are consistent with Grade I diastolic dysfunction (impaired relaxation).  Right Ventricle: The right ventricular size is mildly enlarged.  No increase in right ventricular wall thickness. Right ventricular systolic function is normal. Tricuspid regurgitation signal is inadequate for assessing PA pressure. Left Atrium: Left atrial size was normal in size. Right Atrium: Right atrial size was normal in size. Pericardium: There is no evidence of pericardial effusion. Mitral Valve: The mitral valve is normal in structure. No evidence of mitral valve regurgitation. No evidence of mitral valve stenosis. Tricuspid Valve: The tricuspid valve is normal in structure. Tricuspid valve regurgitation is not demonstrated. Aortic Valve: The aortic valve was not well visualized. Aortic valve regurgitation is not visualized. No aortic stenosis is present. Aortic valve mean gradient measures 10.0 mmHg. Aortic valve peak gradient measures 16.5 mmHg. Aortic valve area, by VTI measures 2.41 cm. Pulmonic Valve: The pulmonic valve was not well visualized. Pulmonic valve regurgitation is not visualized. Aorta: The aortic root is normal in size and structure. Venous: The inferior vena cava is normal in size with greater than 50% respiratory variability, suggesting right atrial pressure of 3 mmHg. IAS/Shunts: No atrial level shunt detected by color flow Doppler.  LEFT VENTRICLE PLAX 2D LVIDd:         4.00 cm   Diastology LVIDs:         1.90 cm   LV e' medial:    9.36 cm/s LV PW:         0.80 cm   LV E/e' medial:  9.4 LV IVS:        0.70 cm   LV e' lateral:   11.10 cm/s LVOT diam:     1.80 cm   LV E/e' lateral: 7.9 LV SV:         68 LV SV Index:   37 LVOT Area:     2.54 cm  RIGHT VENTRICLE RV S prime:     15.90 cm/s TAPSE (M-mode): 2.0 cm LEFT ATRIUM             Index        RIGHT ATRIUM           Index LA Vol (A2C):   46.3 ml 24.89 ml/m  RA Area:     18.20 cm LA Vol (A4C):   50.1 ml 26.93 ml/m  RA Volume:   51.70 ml  27.79 ml/m LA Biplane Vol: 48.4 ml 26.02 ml/m  AORTIC VALVE AV Area (Vmax):    2.28 cm AV  Area (Vmean):   2.06 cm AV Area (VTI):     2.41 cm AV Vmax:           203.00 cm/s AV Vmean:          152.000 cm/s AV VTI:            0.282 m AV Peak Grad:      16.5 mmHg AV Mean Grad:      10.0 mmHg LVOT Vmax:         182.00 cm/s LVOT Vmean:        123.000 cm/s LVOT VTI:          0.267 m LVOT/AV VTI ratio: 0.95  AORTA Ao Root diam: 2.40 cm MITRAL VALVE MV Area (PHT): 3.85 cm    SHUNTS MV Decel Time: 197 msec    Systemic VTI:  0.27 m MV E velocity: 88.00 cm/s  Systemic Diam: 1.80 cm MV A velocity: 97.50 cm/s MV E/A ratio:  0.90 Dalton McleanMD Electronically signed by Wilfred Lacy Signature Date/Time: 12/12/2023/2:44:37 PM    Final    CARDIAC CATHETERIZATION Result Date: 12/12/2023  1. Low filling pressures. 2. Normal PA pressure 3. Low but not markedly low cardiac output (CI 2.2 by Fick and thermo). 4. Mildly low PAPi   DG Chest Port 1 View Result Date: 12/12/2023 CLINICAL DATA:  Shortness of breath EXAM: PORTABLE CHEST - 1 VIEW COMPARISON:  09/18/2017 FINDINGS: Lower lung volumes with crowding of perihilar bronchovascular structures. Heart size and mediastinal contours are within normal limits. No effusion. Visualized bones unremarkable. IMPRESSION: Low volumes. No acute findings. Electronically Signed   By: Corlis Leak M.D.   On: 12/12/2023 13:09   CT ANGIO HEAD NECK W WO CM W PERF (CODE STROKE) Result Date: 12/12/2023 CLINICAL DATA:  Left-sided weakness EXAM: CT ANGIOGRAPHY HEAD AND NECK CT PERFUSION BRAIN TECHNIQUE: Multidetector CT imaging of the head and neck was performed using the standard protocol during bolus administration of intravenous contrast. Multiplanar CT image reconstructions and MIPs were obtained to evaluate the vascular anatomy. Carotid stenosis measurements (when applicable) are obtained utilizing NASCET criteria, using the distal internal carotid diameter as the denominator. Multiphase CT imaging of the brain was performed following IV bolus contrast injection. Subsequent  parametric perfusion maps were calculated using RAPID software. RADIATION DOSE REDUCTION: This exam was performed according to the departmental dose-optimization program which includes automated exposure control, adjustment of the mA and/or kV according to patient size and/or use of iterative reconstruction technique. CONTRAST:  Dose is not known on this in progress study COMPARISON:  Head CT earlier today FINDINGS: CTA NECK FINDINGS Aortic arch: Atheromatous plaque with 3 vessel branching Right carotid system: Low-density wall thickening suggesting atherosclerosis. No ulceration or intimal flap Left carotid system: Mild generalized wall thickening. No ulceration or dissection flap Vertebral arteries: No proximal subclavian stenosis. Dominant left vertebral artery. No vertebral beading or flow reducing stenosis. Skeleton: No acute finding Other neck: Venous gas seen in the bilateral neck. Upper chest: Biapical emphysema. Review of the MIP images confirms the above findings CTA HEAD FINDINGS Anterior circulation: ~70% stenosis of the right ICA at the lacernal segment . No major branch occlusion. No beading or aneurysm seen. Posterior circulation: Dominant left vertebral artery. The vertebral and basilar arteries are widely patent. No branch occlusion, beading, or aneurysm. Venous sinuses: Diffusely patent Anatomic variants: As above Review of the MIP images confirms the above findings CT Brain Perfusion Findings: ASPECTS: 10 CBF (<30%) Volume: 0 Perfusion (Tmax>6.0s) volume: 169 cc but highlighting the bilateral cerebral hemispheres in a fairly symmetric distribution suggesting global background cause or artifact. IMPRESSION: 1. No emergent large vessel occlusion. 2. 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 There is atherosclerosis but this would be an atypical location for focal atheromatous narrowing. No underlying dissection seen either, cause indeterminate. 3. No core infarct by CT  perfusion. Generalized delayed perfusion suggests artifact or global/central cause. Electronically Signed   By: Tiburcio Pea M.D.   On: 12/12/2023 10:00   CT HEAD CODE STROKE WO CONTRAST Result Date: 12/12/2023 CLINICAL DATA:  Code stroke.  Left-sided weakness EXAM: CT HEAD WITHOUT CONTRAST TECHNIQUE: Contiguous axial images were obtained from the base of the skull through the vertex without intravenous contrast. RADIATION DOSE REDUCTION: This exam was performed according to the departmental dose-optimization program which includes automated exposure control, adjustment of the mA and/or kV according to patient size and/or use of iterative reconstruction technique. COMPARISON:  None Available. FINDINGS: Brain: No evidence of acute infarction, hemorrhage, hydrocephalus, extra-axial collection or mass lesion/mass effect. Vascular: No hyperdense vessel or unexpected calcification. Skull: Normal.  Negative for fracture or focal lesion. Sinuses/Orbits: No acute finding. Other: Prelim sent in epic chat ASPECTS [facility] Stroke Program Early CT Score) - Ganglionic level infarction (caudate, lentiform nuclei, internal capsule, insula, M1-M3 cortex): 7 - Supraganglionic infarction (M4-M6 cortex): 3 Total score (0-10 with 10 being normal): 10 IMPRESSION: 1. No acute finding. 2. ASPECTS is 10 Electronically Signed   By: Tiburcio Pea M.D.   On: 12/12/2023 09:35   CT ABDOMEN PELVIS W CONTRAST Result Date: 11/29/2023 CLINICAL DATA:  Abdominal pain, acute, nonlocalized Pt c/o lower abdominal pain x 7 days with diarrhea and vomiting starting x 2 days ago; pt describes the pain as a burning sensation EXAM: CT ABDOMEN AND PELVIS WITH CONTRAST TECHNIQUE: Multidetector CT imaging of the abdomen and pelvis was performed using the standard protocol following bolus administration of intravenous contrast. RADIATION DOSE REDUCTION: This exam was performed according to the departmental dose-optimization program which includes  automated exposure control, adjustment of the mA and/or kV according to patient size and/or use of iterative reconstruction technique. CONTRAST:  OMNIPAQUE IOHEXOL 300 MG/ML  SOLN COMPARISON:  CT abdomen pelvis 09/01/2015 FINDINGS: Lower chest: No acute abnormality. Small posterior left diaphragmatic fat containing hernia. Possible tiny hiatal hernia. Hepatobiliary: No focal liver abnormality. Status post cholecystectomy. Mild prominence of the intra and extrahepatic biliary ductal system which can be seen in the post cholecystectomy setting. Pancreas: Interval development of a 7 mm pancreatic neck hypodensity. Normal pancreatic contour. No surrounding inflammatory changes. No main pancreatic ductal dilatation. Spleen:  Normal in size without focal abnormality. Adrenals/Urinary Tract: No adrenal nodule bilaterally. Bilateral kidneys enhance symmetrically. Fluid density lesion of the right kidney likely represents a simple renal cyst. Simple renal cysts, in the absence of clinically indicated signs/symptoms, require no independent follow-up. No hydronephrosis. No hydroureter. The urinary bladder is unremarkable. Stomach/Bowel: Stomach is within normal limits. Short segment of small bowel within the left mid abdomen that is under distended with likely trace fluid layering, no definite bowel wall thickening. No evidence of bowel wall thickening or dilatation. Colonic diverticulosis. Status post appendectomy. Vascular/Lymphatic: No abdominal aorta or iliac aneurysm. At least moderate atherosclerotic plaque of the aorta and its branches. No abdominal, pelvic, or inguinal lymphadenopathy. Reproductive: Status post hysterectomy. No adnexal masses. Other: No intraperitoneal free fluid. No intraperitoneal free gas. No organized fluid collection. Musculoskeletal: No abdominal wall hernia or abnormality. No suspicious lytic or blastic osseous lesions. No acute displaced fracture. IMPRESSION: 1. No acute intra-abdominal  or intrapelvic abnormality. 2. Interval development of a 7 mm pancreatic neck hypodensity. Finding could represent IPMN versus focal fatty intercolation versus lipoma. Recommend MRI pancreatic protocol in 1 year to evaluate for stability. Reimage Q 1 year x5. 3. Colonic diverticulosis with no acute diverticulitis. 4.  Aortic Atherosclerosis (ICD10-I70.0). Electronically Signed   By: Tish Frederickson M.D.   On: 11/29/2023 07:14    Microbiology Recent Results (from the past 240 hours)  Resp panel by RT-PCR (RSV, Flu A&B, Covid) Anterior Nasal Swab     Status: None   Collection Time: 12/12/23  9:47 AM   Specimen: Anterior Nasal Swab  Result Value Ref Range Status   SARS Coronavirus 2 by RT PCR NEGATIVE NEGATIVE Final    Comment: (NOTE) SARS-CoV-2 target nucleic acids are NOT DETECTED.  The SARS-CoV-2 RNA is generally detectable in upper respiratory specimens during the acute phase of infection. The lowest concentration of SARS-CoV-2 viral copies this assay can detect is 138 copies/mL. A negative result does not preclude SARS-Cov-2 infection  and should not be used as the sole basis for treatment or other patient management decisions. A negative result may occur with  improper specimen collection/handling, submission of specimen other than nasopharyngeal swab, presence of viral mutation(s) within the areas targeted by this assay, and inadequate number of viral copies(<138 copies/mL). A negative result must be combined with clinical observations, patient history, and epidemiological information. The expected result is Negative.  Fact Sheet for Patients:  BloggerCourse.com  Fact Sheet for Healthcare Providers:  SeriousBroker.it  This test is no t yet approved or cleared by the [place] FDA and  has been authorized for detection and/or diagnosis of SARS-CoV-2 by FDA under an Emergency Use Authorization (EUA). This EUA will remain  in  effect (meaning this test can be used) for the duration of the COVID-19 declaration under Section 564(b)(1) of the Act, 21 U.S.C.section 360bbb-3(b)(1), unless the authorization is terminated  or revoked sooner.       Influenza A by PCR NEGATIVE NEGATIVE Final   Influenza B by PCR NEGATIVE NEGATIVE Final    Comment: (NOTE) The Xpert Xpress SARS-CoV-2/FLU/RSV plus assay is intended as an aid in the diagnosis of influenza from Nasopharyngeal swab specimens and should not be used as a sole basis for treatment. Nasal washings and aspirates are unacceptable for Xpert Xpress SARS-CoV-2/FLU/RSV testing.  Fact Sheet for Patients: BloggerCourse.com  Fact Sheet for Healthcare Providers: SeriousBroker.it  This test is not yet approved or cleared by the [place] FDA and has been authorized for detection and/or diagnosis of SARS-CoV-2 by FDA under an Emergency Use Authorization (EUA). This EUA will remain in effect (meaning this test can be used) for the duration of the COVID-19 declaration under Section 564(b)(1) of the Act, 21 U.S.C. section 360bbb-3(b)(1), unless the authorization is terminated or revoked.     Resp Syncytial Virus by PCR NEGATIVE NEGATIVE Final    Comment: (NOTE) Fact Sheet for Patients: BloggerCourse.com  Fact Sheet for Healthcare Providers: SeriousBroker.it  This test is not yet approved or cleared by the [place] FDA and has been authorized for detection and/or diagnosis of SARS-CoV-2 by FDA under an Emergency Use Authorization (EUA). This EUA will remain in effect (meaning this test can be used) for the duration of the COVID-19 declaration under Section 564(b)(1) of the Act, 21 U.S.C. section 360bbb-3(b)(1), unless the authorization is terminated or revoked.  Performed at [facility], [address], [place], [place] [address]   MRSA Next Gen by PCR,  Nasal     Status: None   Collection Time: 12/12/23  6:42 PM   Specimen: Nasal Mucosa; Nasal Swab  Result Value Ref Range Status   MRSA by PCR Next Gen NOT DETECTED NOT DETECTED Final    Comment: (NOTE) The GeneXpert MRSA Assay (FDA approved for NASAL specimens only), is one component of a comprehensive MRSA colonization surveillance program. It is not intended to diagnose MRSA infection nor to guide or monitor treatment for MRSA infections. Test performance is not FDA approved in patients less than 70 years old. Performed at [facility], [address], [place], [place] [address]     Lab Basic Metabolic Panel: Recent Labs  Lab 12/12/23 1308 12/12/23 1046 12/12/23 1239 12/12/23 1240 12/12/23 1243 12/12/23 1441 12/12/23 1615 12/12/23 2141 12/12/23 2216  NA 131* 132*   < > 133* 134* 132*  --  134* 134*  K >7.5* 8.0*   < > 6.8* 6.8* 5.8* 5.7* 5.8* 6.4*  CL 96* 104  --   --  100 101  --   --   --   CO2 13*  --   --   --  9* 13*  --   --   --   GLUCOSE 157* 143*  --   --  203* 362*  --   --   --   BUN 29* 37*  --   --  28* 29*  --   --   --   CREATININE 2.93* 2.80*  --   --  3.16* 2.97*  --   --   --   CALCIUM 7.2*  --   --   --  7.2* 6.6*  --   --   --    < > = values in this interval not displayed.   Liver Function Tests: Recent Labs  Lab 12/12/23 0959  AST 4,514*  ALT 4,322*  ALKPHOS 137*  BILITOT 0.7  PROT 6.6  ALBUMIN 3.2*   Recent Labs  Lab 12/12/23 1615  LIPASE 133*   No results for input(s): "AMMONIA" in the last 168 hours. CBC: Recent Labs  Lab 12/12/23 0919 12/12/23 1046 12/12/23 1240 12/12/23 1441 12/12/23 1615 12/12/23 2141 12/12/23 2216 12/14/2023 0026  WBC 25.5*  --   --  22.0*  --   --   --  11.9*  NEUTROABS 21.7*  --   --   --   --   --   --   --   HGB 13.8   < > 15.0 12.4  --  11.9* 9.2* 10.4*  HCT 44.4   < > 44.0 38.9  --  35.0* 27.0* 32.5*  MCV 99.8  --   --  97.7  --   --   --  98.2  PLT 187  --   --  95* 100*  --   --  105*    < > = values in this interval not displayed.   Cardiac Enzymes: Recent Labs  Lab 12/12/23 1441  CKTOTAL >50,000*   Sepsis Labs: Recent Labs  Lab 12/12/23 0919 12/12/23 1104 12/12/23 1259 12/12/23 1441 12/27/2023 0026  WBC 25.5*  --   --  22.0* 11.9*  LATICACIDVEN  --  8.5* 8.1* 8.3*  --     Procedures/Operations  Intubation  Central line placement Arterial line placement  Exploratory laparotomy with subsequent small and large bowel resection left in discontinuity   [facility] 12/30/2023 3:35 PM  Please see [email or web address] for pager details.  From 7A-7P if no response, please call [phone] After hours, please call ELink [phone]

## 2024-01-02 NOTE — Progress Notes (Signed)
 Time of death at 28. Confirmed by Will Keeghan Bialy RN and Benay Pillow RN. No heart beats or respirations after listening for one minute each.

## 2024-01-02 DEATH — deceased
